# Patient Record
Sex: Female | Born: 1960 | Race: White | Hispanic: No | Marital: Single | Smoking: Never smoker
Health system: Southern US, Community
[De-identification: ages and names within clinical notes are randomized; demographics above are authoritative.]

## PROBLEM LIST (undated history)

## (undated) DIAGNOSIS — R112 Nausea with vomiting, unspecified: Secondary | ICD-10-CM

## (undated) DIAGNOSIS — Z87442 Personal history of urinary calculi: Secondary | ICD-10-CM

## (undated) DIAGNOSIS — E559 Vitamin D deficiency, unspecified: Secondary | ICD-10-CM

## (undated) DIAGNOSIS — Z9889 Other specified postprocedural states: Secondary | ICD-10-CM

## (undated) DIAGNOSIS — M810 Age-related osteoporosis without current pathological fracture: Secondary | ICD-10-CM

## (undated) DIAGNOSIS — K219 Gastro-esophageal reflux disease without esophagitis: Secondary | ICD-10-CM

## (undated) DIAGNOSIS — C801 Malignant (primary) neoplasm, unspecified: Secondary | ICD-10-CM

## (undated) HISTORY — PX: KNEE ARTHROSCOPY: SHX127

## (undated) HISTORY — PX: FRACTURE SURGERY: SHX138

## (undated) HISTORY — PX: APPENDECTOMY: SHX54

## (undated) SURGERY — Surgical Case
Anesthesia: *Unknown

---
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
## 2018-03-08 ENCOUNTER — Other Ambulatory Visit: Payer: Self-pay | Admitting: Physician Assistant

## 2018-03-08 DIAGNOSIS — M2392 Unspecified internal derangement of left knee: Secondary | ICD-10-CM

## 2018-03-23 ENCOUNTER — Ambulatory Visit
Admission: RE | Admit: 2018-03-23 | Discharge: 2018-03-23 | Disposition: A | Discharge status: Home / Self Care | Payer: PRIVATE HEALTH INSURANCE | Source: Ambulatory Visit | Attending: Physician Assistant | Admitting: Physician Assistant

## 2018-03-23 DIAGNOSIS — S83242A Other tear of medial meniscus, current injury, left knee, initial encounter: Secondary | ICD-10-CM | POA: Diagnosis not present

## 2018-03-23 DIAGNOSIS — X58XXXA Exposure to other specified factors, initial encounter: Secondary | ICD-10-CM | POA: Insufficient documentation

## 2018-03-23 DIAGNOSIS — M1712 Unilateral primary osteoarthritis, left knee: Secondary | ICD-10-CM | POA: Insufficient documentation

## 2018-03-23 DIAGNOSIS — M25462 Effusion, left knee: Secondary | ICD-10-CM | POA: Diagnosis not present

## 2018-03-23 DIAGNOSIS — M2392 Unspecified internal derangement of left knee: Secondary | ICD-10-CM

## 2018-04-24 ENCOUNTER — Ambulatory Visit: Payer: PRIVATE HEALTH INSURANCE | Admitting: Certified Registered"

## 2018-04-24 ENCOUNTER — Other Ambulatory Visit: Payer: Self-pay

## 2018-04-24 ENCOUNTER — Encounter: Payer: Self-pay | Admitting: *Deleted

## 2018-04-24 ENCOUNTER — Encounter
Admission: RE | Disposition: A | Discharge status: Home / Self Care | Payer: Self-pay | Source: Ambulatory Visit | Attending: Orthopedic Surgery

## 2018-04-24 ENCOUNTER — Ambulatory Visit
Admission: RE | Admit: 2018-04-24 | Discharge: 2018-04-24 | Disposition: A | Discharge status: Home / Self Care | Payer: PRIVATE HEALTH INSURANCE | Source: Ambulatory Visit | Attending: Orthopedic Surgery | Admitting: Orthopedic Surgery

## 2018-04-24 DIAGNOSIS — M238X2 Other internal derangements of left knee: Secondary | ICD-10-CM | POA: Diagnosis not present

## 2018-04-24 DIAGNOSIS — Z881 Allergy status to other antibiotic agents status: Secondary | ICD-10-CM | POA: Diagnosis not present

## 2018-04-24 DIAGNOSIS — Z8249 Family history of ischemic heart disease and other diseases of the circulatory system: Secondary | ICD-10-CM | POA: Diagnosis not present

## 2018-04-24 DIAGNOSIS — Z91040 Latex allergy status: Secondary | ICD-10-CM | POA: Insufficient documentation

## 2018-04-24 DIAGNOSIS — Z885 Allergy status to narcotic agent status: Secondary | ICD-10-CM | POA: Insufficient documentation

## 2018-04-24 DIAGNOSIS — Z9889 Other specified postprocedural states: Secondary | ICD-10-CM

## 2018-04-24 HISTORY — DX: Other specified postprocedural states: R11.2

## 2018-04-24 HISTORY — DX: Other specified postprocedural states: Z98.890

## 2018-04-24 HISTORY — PX: KNEE ARTHROSCOPY: SHX127

## 2018-04-24 SURGERY — ARTHROSCOPY, KNEE
Anesthesia: General | Site: Knee | Laterality: Left | Wound class: "Clean "

## 2018-04-24 MED ORDER — ACETAMINOPHEN 10 MG/ML IV SOLN
INTRAVENOUS | Status: DC | PRN
  Administered 2018-04-24: 1000 mg via INTRAVENOUS

## 2018-04-24 MED ORDER — MIDAZOLAM HCL 2 MG/2ML IJ SOLN
INTRAMUSCULAR | Status: DC | PRN
  Administered 2018-04-24: 4 mg via INTRAVENOUS

## 2018-04-24 MED ORDER — HYDROMORPHONE HCL 1 MG/ML IJ SOLN
INTRAMUSCULAR | Status: AC
  Filled 2018-04-24: qty 1

## 2018-04-24 MED ORDER — FENTANYL CITRATE (PF) 100 MCG/2ML IJ SOLN
INTRAMUSCULAR | Status: AC
  Filled 2018-04-24: qty 2

## 2018-04-24 MED ORDER — BUPIVACAINE-EPINEPHRINE (PF) 0.25% -1:200000 IJ SOLN
INTRAMUSCULAR | Status: AC
Start: 2018-04-24 — End: ?
  Filled 2018-04-24: qty 30

## 2018-04-24 MED ORDER — FAMOTIDINE 20 MG PO TABS
20.0000 mg | ORAL_TABLET | Freq: Once | ORAL | Status: AC
  Administered 2018-04-24: 20 mg via ORAL

## 2018-04-24 MED ORDER — PROPOFOL 10 MG/ML IV BOLUS
INTRAVENOUS | Status: AC
  Filled 2018-04-24: qty 20

## 2018-04-24 MED ORDER — GLYCOPYRROLATE 0.2 MG/ML IJ SOLN
INTRAMUSCULAR | Status: DC | PRN
  Administered 2018-04-24: 0.1 mg via INTRAVENOUS

## 2018-04-24 MED ORDER — ONDANSETRON HCL 4 MG/2ML IJ SOLN
INTRAMUSCULAR | Status: AC
  Filled 2018-04-24: qty 2

## 2018-04-24 MED ORDER — MORPHINE SULFATE (PF) 4 MG/ML IV SOLN
INTRAVENOUS | Status: AC
  Filled 2018-04-24: qty 1

## 2018-04-24 MED ORDER — DEXAMETHASONE SODIUM PHOSPHATE 10 MG/ML IJ SOLN
INTRAMUSCULAR | Status: AC
  Filled 2018-04-24: qty 1

## 2018-04-24 MED ORDER — LIDOCAINE HCL (PF) 2 % IJ SOLN
INTRAMUSCULAR | Status: AC
  Filled 2018-04-24: qty 10

## 2018-04-24 MED ORDER — FAMOTIDINE 20 MG PO TABS
ORAL_TABLET | ORAL | Status: AC
  Administered 2018-04-24: 20 mg via ORAL
  Filled 2018-04-24: qty 1

## 2018-04-24 MED ORDER — ONDANSETRON HCL 4 MG/2ML IJ SOLN: 4.0000 mg | Freq: Four times a day (QID) | INTRAMUSCULAR | Status: DC | PRN

## 2018-04-24 MED ORDER — HYDROCODONE-ACETAMINOPHEN 5-325 MG PO TABS: 1.0000 | ORAL_TABLET | ORAL | 0 refills | Status: DC | PRN

## 2018-04-24 MED ORDER — BUPIVACAINE-EPINEPHRINE (PF) 0.25% -1:200000 IJ SOLN
INTRAMUSCULAR | Status: DC | PRN
  Administered 2018-04-24: 5 mL
  Administered 2018-04-24: 25 mL

## 2018-04-24 MED ORDER — DEXAMETHASONE SODIUM PHOSPHATE 10 MG/ML IJ SOLN
INTRAMUSCULAR | Status: DC | PRN
  Administered 2018-04-24: 10 mg via INTRAVENOUS

## 2018-04-24 MED ORDER — CHLORHEXIDINE GLUCONATE 4 % EX LIQD: 60.0000 mL | Freq: Once | CUTANEOUS | Status: DC

## 2018-04-24 MED ORDER — ONDANSETRON HCL 4 MG PO TABS: 4.0000 mg | ORAL_TABLET | Freq: Four times a day (QID) | ORAL | Status: DC | PRN

## 2018-04-24 MED ORDER — SODIUM CHLORIDE 0.9 % IV SOLN: INTRAVENOUS | Status: DC

## 2018-04-24 MED ORDER — HYDROCODONE-ACETAMINOPHEN 5-325 MG PO TABS: 1.0000 | ORAL_TABLET | ORAL | Status: DC | PRN

## 2018-04-24 MED ORDER — HYDROMORPHONE HCL 1 MG/ML IJ SOLN
INTRAMUSCULAR | Status: DC | PRN
  Administered 2018-04-24 (×2): 1 mg via INTRAVENOUS

## 2018-04-24 MED ORDER — METOCLOPRAMIDE HCL 10 MG PO TABS: 5.0000 mg | ORAL_TABLET | Freq: Three times a day (TID) | ORAL | Status: DC | PRN

## 2018-04-24 MED ORDER — KETAMINE HCL 10 MG/ML IJ SOLN
INTRAMUSCULAR | Status: DC | PRN
  Administered 2018-04-24: 30 mg via INTRAVENOUS
  Administered 2018-04-24: 20 mg via INTRAVENOUS

## 2018-04-24 MED ORDER — FENTANYL CITRATE (PF) 100 MCG/2ML IJ SOLN
25.0000 ug | INTRAMUSCULAR | Status: DC | PRN
  Administered 2018-04-24 (×3): 25 ug via INTRAVENOUS

## 2018-04-24 MED ORDER — LIDOCAINE HCL (CARDIAC) PF 100 MG/5ML IV SOSY
PREFILLED_SYRINGE | INTRAVENOUS | Status: DC | PRN
  Administered 2018-04-24: 50 mg via INTRAVENOUS

## 2018-04-24 MED ORDER — MORPHINE SULFATE 4 MG/ML IJ SOLN
INTRAMUSCULAR | Status: DC | PRN
  Administered 2018-04-24: 4 mg

## 2018-04-24 MED ORDER — KETAMINE HCL 50 MG/ML IJ SOLN
INTRAMUSCULAR | Status: AC
  Filled 2018-04-24: qty 10

## 2018-04-24 MED ORDER — METOCLOPRAMIDE HCL 5 MG/ML IJ SOLN: 5.0000 mg | Freq: Three times a day (TID) | INTRAMUSCULAR | Status: DC | PRN

## 2018-04-24 MED ORDER — PROPOFOL 10 MG/ML IV BOLUS
INTRAVENOUS | Status: DC | PRN
  Administered 2018-04-24: 150 mg via INTRAVENOUS

## 2018-04-24 MED ORDER — ACETAMINOPHEN 10 MG/ML IV SOLN
INTRAVENOUS | Status: AC
  Filled 2018-04-24: qty 100

## 2018-04-24 MED ORDER — LACTATED RINGERS IV SOLN
INTRAVENOUS | Status: DC
  Administered 2018-04-24: 15:00:00 via INTRAVENOUS

## 2018-04-24 MED ORDER — ONDANSETRON HCL 4 MG/2ML IJ SOLN: 4.0000 mg | Freq: Once | INTRAMUSCULAR | Status: DC | PRN

## 2018-04-24 MED ORDER — GLYCOPYRROLATE 0.2 MG/ML IJ SOLN
INTRAMUSCULAR | Status: AC
  Filled 2018-04-24: qty 1

## 2018-04-24 MED ORDER — MIDAZOLAM HCL 2 MG/2ML IJ SOLN
INTRAMUSCULAR | Status: AC
  Filled 2018-04-24: qty 4

## 2018-04-24 MED ORDER — ONDANSETRON HCL 4 MG/2ML IJ SOLN
INTRAMUSCULAR | Status: DC | PRN
  Administered 2018-04-24: 4 mg via INTRAVENOUS

## 2018-04-24 SURGICAL SUPPLY — 28 items
ABLATOR ASPIRATE 50D MULTI-PRT (SURGICAL WAND) ×2 IMPLANT
BLADE SHAVER 4.5 DBL SERAT CV (CUTTER) IMPLANT
Bone Cutter Curved 4.0mm, 13cm ×2 IMPLANT
CUFF TOURN 24 STER (MISCELLANEOUS) ×2 IMPLANT
CUFF TOURN 30 STER DUAL PORT (MISCELLANEOUS) IMPLANT
CUTTER BONE CURVED 4.0X13 (MISCELLANEOUS) ×2 IMPLANT
DRSG DERMACEA 8X12 NADH (GAUZE/BANDAGES/DRESSINGS) ×3 IMPLANT
DURAPREP 26ML APPLICATOR (WOUND CARE) ×4 IMPLANT
DW OUTFLOW CASSETTE/TUBE SET (MISCELLANEOUS) ×2 IMPLANT
GAUZE SPONGE 4X4 12PLY STRL (GAUZE/BANDAGES/DRESSINGS) ×3 IMPLANT
GLOVE BIOGEL M STRL SZ7.5 (GLOVE) ×3 IMPLANT
GLOVE INDICATOR 8.0 STRL GRN (GLOVE) ×3 IMPLANT
GOWN STRL REUS W/ TWL LRG LVL3 (GOWN DISPOSABLE) ×2 IMPLANT
GOWN STRL REUS W/TWL LRG LVL3 (GOWN DISPOSABLE) ×4
IV LACTATED RINGER IRRG 3000ML (IV SOLUTION) ×10
IV LR IRRIG 3000ML ARTHROMATIC (IV SOLUTION) ×6 IMPLANT
KIT TURNOVER KIT A (KITS) ×3 IMPLANT
MANIFOLD NEPTUNE II (INSTRUMENTS) ×3 IMPLANT
PACK ARTHROSCOPY KNEE (MISCELLANEOUS) ×3 IMPLANT
SET TUBE SUCT SHAVER OUTFL 24K (TUBING) ×1 IMPLANT
SET TUBE TIP INTRA-ARTICULAR (MISCELLANEOUS) ×1 IMPLANT
SUT ETHILON 3-0 FS-10 30 BLK (SUTURE) ×3
SUTURE EHLN 3-0 FS-10 30 BLK (SUTURE) ×1 IMPLANT
TUBING ARTHRO INFLOW-ONLY STRL (TUBING) ×1 IMPLANT
TUBING REDEUCE PAT W/CON 8IN (MISCELLANEOUS) ×2 IMPLANT
TUBING REDEUCE PUMP W/CON 8IN (MISCELLANEOUS) ×2 IMPLANT
WAND HAND CNTRL MULTIVAC 50 (MISCELLANEOUS) ×3 IMPLANT
WRAP KNEE W/COLD PACKS 25.5X14 (SOFTGOODS) ×3 IMPLANT

## 2018-04-24 NOTE — H&P (Signed)
The patient has been re-examined, and the chart reviewed, and there have been no interval changes to the documented history and physical.    The risks, benefits, and alternatives have been discussed at length. The patient expressed understanding of the risks benefits and agreed with plans for surgical intervention.  James P. Hooten, Jr. M.D.    

## 2018-04-24 NOTE — Anesthesia Postprocedure Evaluation (Signed)
Anesthesia Post Note  Patient: Alice Taylor  Procedure(s) Performed: ARTHROSCOPY KNEE, PARTIAL MEDIAL MENISCECTOMY (Left Knee)  Patient location during evaluation: PACU Anesthesia Type: General Level of consciousness: awake and alert Pain management: pain level controlled Vital Signs Assessment: post-procedure vital signs reviewed and stable Respiratory status: spontaneous breathing, nonlabored ventilation, respiratory function stable and patient connected to nasal cannula oxygen Cardiovascular status: blood pressure returned to baseline and stable Postop Assessment: no apparent nausea or vomiting Anesthetic complications: no     Last Vitals:  Vitals:   04/24/18 1833 04/24/18 1912  BP:  104/70  Pulse: 88 93  Resp: 16 18  Temp: (!) 36.1 C 36.6 C  SpO2: 95% 95%    Last Pain:  Vitals:   04/24/18 1912  TempSrc: Temporal  PainSc: 2                  Martha Clan

## 2018-04-24 NOTE — Anesthesia Post-op Follow-up Note (Signed)
Anesthesia QCDR form completed.        

## 2018-04-24 NOTE — Anesthesia Preprocedure Evaluation (Signed)
Anesthesia Evaluation  Patient identified by MRN, date of birth, ID band Patient awake    Reviewed: Allergy & Precautions, NPO status , Patient's Chart, lab work & pertinent test results  History of Anesthesia Complications (+) PONV and history of anesthetic complications  Airway Mallampati: II  TM Distance: >3 FB     Dental  (+) Teeth Intact   Pulmonary neg pulmonary ROS,    Pulmonary exam normal        Cardiovascular negative cardio ROS Normal cardiovascular exam     Neuro/Psych negative neurological ROS  negative psych ROS   GI/Hepatic negative GI ROS, Neg liver ROS,   Endo/Other  negative endocrine ROS  Renal/GU negative Renal ROS  negative genitourinary   Musculoskeletal   Abdominal Normal abdominal exam  (+)   Peds negative pediatric ROS (+)  Hematology negative hematology ROS (+)   Anesthesia Other Findings   Reproductive/Obstetrics                             Anesthesia Physical Anesthesia Plan  ASA: I  Anesthesia Plan: General   Post-op Pain Management:    Induction: Intravenous  PONV Risk Score and Plan:   Airway Management Planned: LMA  Additional Equipment:   Intra-op Plan:   Post-operative Plan: Extubation in OR  Informed Consent: I have reviewed the patients History and Physical, chart, labs and discussed the procedure including the risks, benefits and alternatives for the proposed anesthesia with the patient or authorized representative who has indicated his/her understanding and acceptance.   Dental advisory given  Plan Discussed with:   Anesthesia Plan Comments:         Anesthesia Quick Evaluation

## 2018-04-24 NOTE — Discharge Instructions (Signed)
AMBULATORY SURGERY  DISCHARGE INSTRUCTIONS   1) The drugs that you were given will stay in your system until tomorrow so for the next 24 hours you should not:  A) Drive an automobile B) Make any legal decisions C) Drink any alcoholic beverage   2) You may resume regular meals tomorrow.  Today it is better to start with liquids and gradually work up to solid foods.  You may eat anything you prefer, but it is better to start with liquids, then soup and crackers, and gradually work up to solid foods.   3) Please notify your doctor immediately if you have any unusual bleeding, trouble breathing, redness and pain at the surgery site, drainage, fever, or pain not relieved by medication.    4) Additional Instructions:        Please contact your physician with any problems or Same Day Surgery at [phone], Monday through Friday 6 am to 4 pm, or [place] at [facility] number at [phone].AMBULATORY SURGERY          Please contact your physician with any problems or Same Day Surgery at [phone], Monday through Friday 6 am to 4 pm, or [place] at [facility] number at [phone].AMBULATORY SURGERY              Please contact your physician with any problems or Same Day Surgery at [phone], Monday through Friday 6 am to 4 pm, or [place] at [facility] number at [phone]. Instructions after Knee Arthroscopy    James P. Holley Bouche., M.D.     Dept. of [facility]  [address], [place]  [address]   Phone: [phone]   Fax: [phone]   DIET:  Drink plenty of non-alcoholic fluids & begin a light diet.  Resume your normal diet the day after surgery.  ACTIVITY:   You may use crutches or a walker with weight-bearing as tolerated, unless instructed otherwise.  You may wean yourself off of the walker or crutches as tolerated.   Begin doing gentle exercises. Exercising will  reduce the pain and swelling, increase motion, and prevent muscle weakness.    Avoid strenuous activities or athletics for a minimum of 4-6 weeks after arthroscopic surgery.  Do not drive or operate any equipment until instructed.  WOUND CARE:   Place one to two pillows under the knee the first day or two when sitting or lying.   Continue to use the ice packs periodically to reduce pain and swelling.  The small incisions in your knee are closed with nylon stitches. The stitches will be removed in the office.  The bulky dressing may be removed on the second day after surgery. DO NOT TOUCH THE STITCHES. Put a Band-Aid over each stitch. Do NOT use any ointments or creams on the incisions.   You may bathe or shower after the stitches are removed at the first office visit following surgery.  MEDICATIONS:  You may resume your regular medications.  Please take the pain medication as prescribed.  Do not take pain medication on an empty stomach.  Do not drive or drink alcoholic beverages when taking pain medications.  CALL THE OFFICE FOR:  Temperature above 101 degrees  Excessive bleeding or drainage on the dressing.  Excessive swelling, coldness, or paleness of the toes.  Persistent nausea and vomiting.  FOLLOW-UP:   You should have an appointment to return to the office in 7-10 days after surgery.

## 2018-04-24 NOTE — Anesthesia Procedure Notes (Signed)
Procedure Name: LMA Insertion Date/Time: 04/24/2018 3:14 PM Performed by: Nile Riggs, CRNA Pre-anesthesia Checklist: Patient identified, Emergency Drugs available, Suction available, Patient being monitored and Timeout performed Patient Re-evaluated:Patient Re-evaluated prior to induction Oxygen Delivery Method: Circle system utilized Preoxygenation: Pre-oxygenation with 100% oxygen Induction Type: IV induction Ventilation: Mask ventilation without difficulty LMA: LMA inserted LMA Size: 4.0 Tube type: Oral Number of attempts: 1 Placement Confirmation: positive ETCO2,  CO2 detector and breath sounds checked- equal and bilateral Tube secured with: Tape Dental Injury: Teeth and Oropharynx as per pre-operative assessment  Comments: Atraumatic seating of AuraStraight LMA

## 2018-04-24 NOTE — Transfer of Care (Signed)
Immediate Anesthesia Transfer of Care Note  Patient: Alice Taylor  Procedure(s) Performed: ARTHROSCOPY KNEE, PARTIAL MEDIAL MENISCECTOMY (Left Knee)  Patient Location: PACU  Anesthesia Type:General  Level of Consciousness: sedated and responds to stimulation  Airway & Oxygen Therapy: Patient Spontanous Breathing and Patient connected to face mask oxygen  Post-op Assessment: Report given to RN and Post -op Vital signs reviewed and stable  Post vital signs: Reviewed and stable  Last Vitals:  Vitals Value Taken Time  BP 104/60 04/24/2018  5:16 PM  Temp    Pulse 84 04/24/2018  5:16 PM  Resp 10 04/24/2018  5:16 PM  SpO2 98 % 04/24/2018  5:16 PM    Last Pain:  Vitals:   04/24/18 1424  TempSrc: Oral  PainSc: 5          Complications: No apparent anesthesia complications

## 2018-04-24 NOTE — Op Note (Signed)
OPERATIVE NOTE  DATE OF SURGERY:  04/24/2018  PATIENT NAME:  Alice Taylor   DOB: February 18, 1961  MRN: [id number]   PRE-OPERATIVE DIAGNOSIS:  Internal derangement of the left knee   POST-OPERATIVE DIAGNOSIS:   Complex tear of the posterior horn of the medial meniscus, left knee  PROCEDURE:  Left knee arthroscopy, partial medial meniscectomy  SURGEON:  Marciano Sequin., M.D.   ASSISTANT: none  ANESTHESIA: general  ESTIMATED BLOOD LOSS: Minimal  FLUIDS REPLACED: 600 mL of crystalloid  TOURNIQUET TIME: not used   INDICATIONS FOR SURGERY: Shaquayla Klimas is a 57 y.o. year old female who has been seen for complaints of left knee pain. MRI demonstrated findings consistent with meniscal pathology. After discussion of the risks and benefits of surgical intervention, the patient expressed understanding of the risks benefits and agree with plans for left knee arthroscopy.   PROCEDURE IN DETAIL: The patient was brought into the operating room and, after adequate general anesthesia was achieved, a tourniquet was applied to the left thigh and the leg was placed in the leg holder. All bony prominences were well padded. The patient's left knee was cleaned and prepped with alcohol and Duraprep and draped in the usual sterile fashion. A "timeout" was performed as per usual protocol. The anticipated portal sites were injected with 0.25% Marcaine with epinephrine. An anterolateral incision was made and a cannula was inserted. A moderate effusion was evacuated and the knee was distended with fluid using the pump. The scope was advanced down the medial gutter into the medial compartment. Under visualization with the scope, an anteromedial portal was created and a hooked probe was inserted. The medial meniscus was visualized and probed. There was a complex tear of the posterior horn of the medial meniscus. The tear was debrided using meniscal punches and a 4.5 mm incisor shaver. Final contouring was performed using a  50 degree wand, The remaining rim of meniscus was visualized and probed and noted to be stable. The articular cartilage was visualized and noted to be in good condition.  The scope was then advanced into the intercondylar notch. The anterior cruciate ligament was visualized and probed and felt to be intact. The scope was removed from the lateral portal and reinserted via the anteromedial portal to better visualize the lateral compartment. The lateral meniscus was visualized and probed. The lateral meniscus was intact without evidence of tear or instability. The articular cartilage of the lateral compartment was visualized and noted to be in good condition Finally, the scope was advanced so as to visualize the patellofemoral articulation. Good patellar tracking was appreciated.   The knee was irrigated with copius amounts of fluid and suctioned dry. The anterolateral portal was re-approximated with #3-0 nylon. A combination of 0.25% Marcaine with epinephrine and 4 mg of Morphine were injected via the scope. The scope was removed and the anteromedial portal was re-approximated with #3-0 nylon. A sterile dressing was applied followed by application of an ice wrap.  The patient tolerated the procedure well and was transported to the PACU in stable condition.  James P. Holley Bouche., M.D.

## 2018-04-25 ENCOUNTER — Encounter: Payer: Self-pay | Admitting: Orthopedic Surgery

## 2019-01-16 ENCOUNTER — Encounter: Payer: Self-pay | Admitting: *Deleted

## 2019-01-16 ENCOUNTER — Emergency Department: Payer: PRIVATE HEALTH INSURANCE

## 2019-01-16 ENCOUNTER — Other Ambulatory Visit: Payer: Self-pay

## 2019-01-16 ENCOUNTER — Inpatient Hospital Stay
Admission: EM | Admit: 2019-01-16 | Discharge: 2019-01-19 | DRG: 494 | Disposition: A | Discharge status: Home Health | Payer: PRIVATE HEALTH INSURANCE | Attending: Orthopedic Surgery | Admitting: Orthopedic Surgery

## 2019-01-16 DIAGNOSIS — S82851A Displaced trimalleolar fracture of right lower leg, initial encounter for closed fracture: Principal | ICD-10-CM

## 2019-01-16 DIAGNOSIS — Z8781 Personal history of (healed) traumatic fracture: Secondary | ICD-10-CM

## 2019-01-16 DIAGNOSIS — Z9889 Other specified postprocedural states: Secondary | ICD-10-CM

## 2019-01-16 DIAGNOSIS — W541XXA Struck by dog, initial encounter: Secondary | ICD-10-CM

## 2019-01-16 DIAGNOSIS — Z91040 Latex allergy status: Secondary | ICD-10-CM

## 2019-01-16 DIAGNOSIS — Z1159 Encounter for screening for other viral diseases: Secondary | ICD-10-CM

## 2019-01-16 DIAGNOSIS — Z885 Allergy status to narcotic agent status: Secondary | ICD-10-CM

## 2019-01-16 DIAGNOSIS — Z888 Allergy status to other drugs, medicaments and biological substances status: Secondary | ICD-10-CM

## 2019-01-16 DIAGNOSIS — Z79899 Other long term (current) drug therapy: Secondary | ICD-10-CM

## 2019-01-16 DIAGNOSIS — T402X5A Adverse effect of other opioids, initial encounter: Secondary | ICD-10-CM | POA: Diagnosis not present

## 2019-01-16 DIAGNOSIS — R110 Nausea: Secondary | ICD-10-CM | POA: Diagnosis not present

## 2019-01-16 DIAGNOSIS — Z85828 Personal history of other malignant neoplasm of skin: Secondary | ICD-10-CM

## 2019-01-16 HISTORY — DX: Malignant (primary) neoplasm, unspecified: C80.1

## 2019-01-16 MED ORDER — ONDANSETRON 4 MG PO TBDP
4.0000 mg | ORAL_TABLET | Freq: Once | ORAL | Status: AC
  Administered 2019-01-16: 4 mg via ORAL
  Filled 2019-01-16: qty 1

## 2019-01-16 MED ORDER — HYDROCODONE-ACETAMINOPHEN 5-325 MG PO TABS
2.0000 | ORAL_TABLET | Freq: Once | ORAL | Status: AC
  Administered 2019-01-16: 2 via ORAL
  Filled 2019-01-16: qty 2

## 2019-01-16 NOTE — ED Provider Notes (Addendum)
[facility] Emergency Department Provider Note       Time seen: ----------------------------------------- 11:36 PM on 01/16/2019 -----------------------------------------   I have reviewed the triage vital signs and the nursing notes.  HISTORY   Chief Complaint Ankle Injury    HPI Alice Taylor is a 58 y.o. female with no significant past medical history who presents to the ED for right ankle injury.  Patient states she was knocked down by her dog which is a [place], subsequently injuring her right ankle.  She cannot bear any weight on the right leg.  She complains of 9 out of 10 pain is worse with any manipulation of the right ankle.  She denies any fevers, chills or other complaints.  Past Medical History:  Diagnosis Date  . PONV (postoperative nausea and vomiting)     There are no active problems to display for this patient.   Past Surgical History:  Procedure Laterality Date  . APPENDECTOMY    . KNEE ARTHROSCOPY Right   . KNEE ARTHROSCOPY Left 04/24/2018   Procedure: ARTHROSCOPY KNEE, PARTIAL MEDIAL MENISCECTOMY;  Surgeon: Dereck Leep, MD;  Location: [facility] ORS;  Service: Orthopedics;  Laterality: Left;    Allergies Latex; Neosporin [neomycin-bacitracin zn-polymyx]; and Codeine  Social History Social History   Tobacco Use  . Smoking status: Never Smoker  . Smokeless tobacco: Never Used  Substance Use Topics  . Alcohol use: Never    Frequency: Never  . Drug use: Not Currently   Review of Systems Constitutional: Negative for fever. Cardiovascular: Negative for chest pain. Respiratory: Negative for shortness of breath. Gastrointestinal: Negative for abdominal pain, vomiting and diarrhea. Musculoskeletal: Positive for right ankle pain and swelling Skin: Negative for rash. Neurological: Negative for headaches, focal weakness or numbness.  All systems negative/normal/unremarkable except as stated in the  HPI  ____________________________________________   PHYSICAL EXAM:  VITAL SIGNS: ED Triage Vitals  Enc Vitals Group     BP 01/16/19 2314 (!) 142/114     Pulse Rate 01/16/19 2314 (!) 106     Resp 01/16/19 2314 18     Temp 01/16/19 2314 98.1 F (36.7 C)     Temp Source 01/16/19 2314 Oral     SpO2 01/16/19 2314 100 %     Weight 01/16/19 2315 160 lb (72.6 kg)     Height 01/16/19 2315 5\' 9"  (1.753 m)     Head Circumference --      Peak Flow --      Pain Score 01/16/19 2323 10     Pain Loc --      Pain Edu? --      Excl. in [place]? --    Constitutional: Alert and oriented. Well appearing and in no distress. Eyes: Conjunctivae are normal. Normal extraocular movements. Cardiovascular: Normal rate, regular rhythm. No murmurs, rubs, or gallops. Respiratory: Normal respiratory effort without tachypnea nor retractions. Breath sounds are clear and equal bilaterally. No wheezes/rales/rhonchi. Musculoskeletal: Obvious deformity in and around the right ankle, pain with range of motion of the right ankle, no tenderness over the proximal fibula Neurologic:  Normal speech and language. No gross focal neurologic deficits are appreciated.  Skin: Right ankle swelling and mild ecchymosis is noted Psychiatric: Mood and affect are normal. Speech and behavior are normal.  ____________________________________________  ED COURSE:  As part of my medical decision making, I reviewed the following data within the [place] History obtained from family if available, nursing notes, old chart and ekg, as well  as notes from prior ED visits. Patient presented for right ankle injury, we will assess with labs and imaging as indicated at this time.   Reduction of dislocation Date/Time: 01/17/2019 1:04 AM Performed by: Earleen Newport, MD Authorized by: Earleen Newport, MD  Consent: Verbal consent obtained. Consent given by: patient Patient identity confirmed: verbally with patient Local  anesthesia used: no  Anesthesia: Local anesthesia used: no  Sedation: Patient sedated: no  Patient tolerance: Patient tolerated the procedure well with no immediate complications Comments: Right ankle fracture dislocation was reduced with downward traction, splints were applied with success.     Alice Taylor was evaluated in Emergency Department on 01/16/2019 for the symptoms described in the history of present illness. She was evaluated in the context of the global COVID-19 pandemic, which necessitated consideration that the patient might be at risk for infection with the SARS-CoV-2 virus that causes COVID-19. Institutional protocols and algorithms that pertain to the evaluation of patients at risk for COVID-19 are in a state of rapid change based on information released by regulatory bodies including the CDC and federal and state organizations. These policies and algorithms were followed during the patient's care in the ED.  ____________________________________________   LABS (pertinent positives/negatives)  Labs Reviewed  SARS CORONAVIRUS 2 (HOSPITAL ORDER, [place] [facility])  CBC WITH DIFFERENTIAL/PLATELET  COMPREHENSIVE METABOLIC PANEL  HIV ANTIBODY (ROUTINE TESTING W REFLEX)    RADIOLOGY Images were viewed by me Right ankle x-ray IMPRESSION: Trimalleolar right ankle fracture with severe subluxation or dislocation.   ____________________________________________   DIFFERENTIAL DIAGNOSIS   Fracture, contusion, dislocation  FINAL ASSESSMENT AND PLAN  Trimalleolar fracture   Plan: The patient had presented for right ankle injury. Patient's labs are as dictated above. Patient's imaging did reveal a trimalleolar right ankle fracture.  She was given Dilaudid for pain here and we discussed with orthopedic surgery for admission.  She will need operative fixation of her right ankle fracture.  We did place her in a posterior and sugar tong  splint.   Laurence Aly, MD    Note: This note was generated in part or whole with voice recognition software. Voice recognition is usually quite accurate but there are transcription errors that can and very often do occur. I apologize for any typographical errors that were not detected and corrected.     Earleen Newport, MD 01/17/19 5621    Earleen Newport, MD 01/17/19 [phone]

## 2019-01-16 NOTE — ED Triage Notes (Signed)
Pt knocked down by her dog, injured R ankle, obviously displaced.

## 2019-01-17 ENCOUNTER — Encounter: Payer: Self-pay | Admitting: Anesthesiology

## 2019-01-17 ENCOUNTER — Encounter
Admission: EM | Disposition: A | Discharge status: Home Health | Payer: Self-pay | Source: Home / Self Care | Attending: Orthopedic Surgery

## 2019-01-17 ENCOUNTER — Other Ambulatory Visit: Payer: Self-pay

## 2019-01-17 ENCOUNTER — Inpatient Hospital Stay: Payer: PRIVATE HEALTH INSURANCE | Admitting: Anesthesiology

## 2019-01-17 ENCOUNTER — Inpatient Hospital Stay: Payer: PRIVATE HEALTH INSURANCE

## 2019-01-17 DIAGNOSIS — S82851A Displaced trimalleolar fracture of right lower leg, initial encounter for closed fracture: Secondary | ICD-10-CM | POA: Diagnosis present

## 2019-01-17 DIAGNOSIS — Z85828 Personal history of other malignant neoplasm of skin: Secondary | ICD-10-CM | POA: Diagnosis not present

## 2019-01-17 DIAGNOSIS — T402X5A Adverse effect of other opioids, initial encounter: Secondary | ICD-10-CM | POA: Diagnosis not present

## 2019-01-17 DIAGNOSIS — Z1159 Encounter for screening for other viral diseases: Secondary | ICD-10-CM | POA: Diagnosis not present

## 2019-01-17 DIAGNOSIS — Z79899 Other long term (current) drug therapy: Secondary | ICD-10-CM | POA: Diagnosis not present

## 2019-01-17 DIAGNOSIS — W541XXA Struck by dog, initial encounter: Secondary | ICD-10-CM | POA: Diagnosis not present

## 2019-01-17 DIAGNOSIS — R110 Nausea: Secondary | ICD-10-CM | POA: Diagnosis not present

## 2019-01-17 DIAGNOSIS — Z888 Allergy status to other drugs, medicaments and biological substances status: Secondary | ICD-10-CM | POA: Diagnosis not present

## 2019-01-17 DIAGNOSIS — Z91040 Latex allergy status: Secondary | ICD-10-CM | POA: Diagnosis not present

## 2019-01-17 DIAGNOSIS — Z885 Allergy status to narcotic agent status: Secondary | ICD-10-CM | POA: Diagnosis not present

## 2019-01-17 HISTORY — PX: ORIF ANKLE FRACTURE: SHX5408

## 2019-01-17 LAB — CBC WITH DIFFERENTIAL/PLATELET
Abs Immature Granulocytes: 0.03 10*3/uL (ref 0.00–0.07)
Basophils Absolute: 0 10*3/uL (ref 0.0–0.1)
Basophils Relative: 0 %
Eosinophils Absolute: 0.1 10*3/uL (ref 0.0–0.5)
Eosinophils Relative: 1 %
HCT: 39.8 % (ref 36.0–46.0)
Hemoglobin: 13 g/dL (ref 12.0–15.0)
Immature Granulocytes: 0 %
Lymphocytes Relative: 11 %
Lymphs Abs: 1.1 10*3/uL (ref 0.7–4.0)
MCH: 29.3 pg (ref 26.0–34.0)
MCHC: 32.7 g/dL (ref 30.0–36.0)
MCV: 89.8 fL (ref 80.0–100.0)
Monocytes Absolute: 0.9 10*3/uL (ref 0.1–1.0)
Monocytes Relative: 8 %
Neutro Abs: 8.1 10*3/uL — ABNORMAL HIGH (ref 1.7–7.7)
Neutrophils Relative %: 80 %
Platelets: 322 10*3/uL (ref 150–400)
RBC: 4.43 MIL/uL (ref 3.87–5.11)
RDW: 12.4 % (ref 11.5–15.5)
WBC: 10.2 10*3/uL (ref 4.0–10.5)
nRBC: 0 % (ref 0.0–0.2)

## 2019-01-17 LAB — COMPREHENSIVE METABOLIC PANEL
ALT: 22 U/L (ref 0–44)
AST: 27 U/L (ref 15–41)
Albumin: 4.2 g/dL (ref 3.5–5.0)
Alkaline Phosphatase: 88 U/L (ref 38–126)
Anion gap: 9 (ref 5–15)
BUN: 19 mg/dL (ref 6–20)
CO2: 24 mmol/L (ref 22–32)
Calcium: 9 mg/dL (ref 8.9–10.3)
Chloride: 106 mmol/L (ref 98–111)
Creatinine, Ser: 0.73 mg/dL (ref 0.44–1.00)
GFR calc Af Amer: >60 mL/min (ref >60)
GFR calc non Af Amer: >60 mL/min (ref >60)
Glucose, Bld: 109 mg/dL — ABNORMAL HIGH (ref 70–99)
Potassium: 3.4 mmol/L — ABNORMAL LOW (ref 3.5–5.1)
Sodium: 139 mmol/L (ref 135–145)
Total Bilirubin: 0.5 mg/dL (ref 0.3–1.2)
Total Protein: 7.7 g/dL (ref 6.5–8.1)

## 2019-01-17 LAB — SARS CORONAVIRUS 2 BY RT PCR (HOSPITAL ORDER, PERFORMED IN ~~LOC~~ HOSPITAL LAB): SARS Coronavirus 2: NEGATIVE

## 2019-01-17 SURGERY — OPEN REDUCTION INTERNAL FIXATION (ORIF) ANKLE FRACTURE
Anesthesia: Spinal | Site: Ankle | Laterality: Right

## 2019-01-17 MED ORDER — MAGNESIUM HYDROXIDE 400 MG/5ML PO SUSP
30.0000 mL | Freq: Every day | ORAL | Status: DC | PRN
  Filled 2019-01-17: qty 30

## 2019-01-17 MED ORDER — METOCLOPRAMIDE HCL 5 MG/ML IJ SOLN
5.0000 mg | Freq: Three times a day (TID) | INTRAMUSCULAR | Status: DC | PRN
  Administered 2019-01-18: 10 mg via INTRAVENOUS
  Filled 2019-01-17: qty 2

## 2019-01-17 MED ORDER — BUPIVACAINE IN DEXTROSE 0.75-8.25 % IT SOLN
INTRATHECAL | Status: DC | PRN
  Administered 2019-01-17: 1.3 mL via INTRATHECAL

## 2019-01-17 MED ORDER — LACTATED RINGERS IV SOLN
INTRAVENOUS | Status: DC
  Administered 2019-01-17: 10:00:00 via INTRAVENOUS

## 2019-01-17 MED ORDER — FENTANYL CITRATE (PF) 100 MCG/2ML IJ SOLN: 25.0000 ug | INTRAMUSCULAR | Status: DC | PRN

## 2019-01-17 MED ORDER — CEFAZOLIN SODIUM 1 G IJ SOLR
INTRAMUSCULAR | Status: AC
  Filled 2019-01-17: qty 10

## 2019-01-17 MED ORDER — OXYCODONE HCL 5 MG PO TABS
10.0000 mg | ORAL_TABLET | ORAL | Status: DC | PRN
  Administered 2019-01-17 – 2019-01-19 (×4): 10 mg via ORAL
  Filled 2019-01-17 (×2): qty 2

## 2019-01-17 MED ORDER — MAGNESIUM CITRATE PO SOLN
1.0000 | Freq: Once | ORAL | Status: DC | PRN
  Filled 2019-01-17: qty 296

## 2019-01-17 MED ORDER — DOCUSATE SODIUM 100 MG PO CAPS
100.0000 mg | ORAL_CAPSULE | Freq: Two times a day (BID) | ORAL | Status: DC
  Administered 2019-01-17 – 2019-01-19 (×3): 100 mg via ORAL
  Filled 2019-01-17 (×4): qty 1

## 2019-01-17 MED ORDER — ACETAMINOPHEN 325 MG PO TABS: 325.0000 mg | ORAL_TABLET | Freq: Four times a day (QID) | ORAL | Status: DC | PRN

## 2019-01-17 MED ORDER — SODIUM CHLORIDE 0.9 % IV SOLN
INTRAVENOUS | Status: DC
  Administered 2019-01-17 (×2): via INTRAVENOUS

## 2019-01-17 MED ORDER — HYDROMORPHONE HCL 1 MG/ML IJ SOLN
0.5000 mg | INTRAMUSCULAR | Status: DC | PRN
  Administered 2019-01-17: 1 mg via INTRAVENOUS
  Filled 2019-01-17: qty 1

## 2019-01-17 MED ORDER — ONDANSETRON HCL 4 MG PO TABS
4.0000 mg | ORAL_TABLET | Freq: Four times a day (QID) | ORAL | Status: DC | PRN
  Administered 2019-01-17: 20:00:00 4 mg via ORAL
  Filled 2019-01-17: qty 1

## 2019-01-17 MED ORDER — METOCLOPRAMIDE HCL 5 MG PO TABS: 5.0000 mg | ORAL_TABLET | Freq: Three times a day (TID) | ORAL | Status: DC | PRN

## 2019-01-17 MED ORDER — ENOXAPARIN SODIUM 40 MG/0.4ML ~~LOC~~ SOLN
40.0000 mg | SUBCUTANEOUS | Status: DC
  Administered 2019-01-18 – 2019-01-19 (×2): 40 mg via SUBCUTANEOUS
  Filled 2019-01-17 (×2): qty 0.4

## 2019-01-17 MED ORDER — ZOLPIDEM TARTRATE 5 MG PO TABS: 5.0000 mg | ORAL_TABLET | Freq: Every evening | ORAL | Status: DC | PRN

## 2019-01-17 MED ORDER — OXYCODONE HCL 5 MG PO TABS
5.0000 mg | ORAL_TABLET | ORAL | Status: DC | PRN
  Administered 2019-01-17 (×2): 10 mg via ORAL
  Administered 2019-01-18 (×4): 5 mg via ORAL
  Administered 2019-01-19 (×2): 10 mg via ORAL
  Filled 2019-01-17: qty 1
  Filled 2019-01-17: qty 2
  Filled 2019-01-17 (×2): qty 1
  Filled 2019-01-17: qty 2
  Filled 2019-01-17: qty 1
  Filled 2019-01-17: qty 2

## 2019-01-17 MED ORDER — BISACODYL 5 MG PO TBEC
5.0000 mg | DELAYED_RELEASE_TABLET | Freq: Every day | ORAL | Status: DC | PRN
  Administered 2019-01-17: 15:00:00 5 mg via ORAL
  Filled 2019-01-17: qty 1

## 2019-01-17 MED ORDER — ONDANSETRON HCL 4 MG/2ML IJ SOLN: 4.0000 mg | Freq: Four times a day (QID) | INTRAMUSCULAR | Status: DC | PRN

## 2019-01-17 MED ORDER — CEFAZOLIN SODIUM-DEXTROSE 2-4 GM/100ML-% IV SOLN
2.0000 g | Freq: Four times a day (QID) | INTRAVENOUS | Status: AC
  Administered 2019-01-17 – 2019-01-18 (×3): 2 g via INTRAVENOUS
  Filled 2019-01-17 (×3): qty 100

## 2019-01-17 MED ORDER — VITAMIN D 25 MCG (1000 UNIT) PO TABS
1000.0000 [IU] | ORAL_TABLET | Freq: Every day | ORAL | Status: DC
  Administered 2019-01-18 – 2019-01-19 (×2): 1000 [IU] via ORAL
  Filled 2019-01-17 (×2): qty 1

## 2019-01-17 MED ORDER — FENTANYL CITRATE (PF) 100 MCG/2ML IJ SOLN
INTRAMUSCULAR | Status: AC
  Filled 2019-01-17: qty 2

## 2019-01-17 MED ORDER — OXYCODONE HCL 5 MG PO TABS
5.0000 mg | ORAL_TABLET | ORAL | Status: DC | PRN
  Filled 2019-01-17 (×3): qty 2

## 2019-01-17 MED ORDER — CEFAZOLIN SODIUM-DEXTROSE 2-4 GM/100ML-% IV SOLN
2.0000 g | Freq: Once | INTRAVENOUS | Status: AC
  Administered 2019-01-17: 2 g via INTRAVENOUS
  Filled 2019-01-17: qty 100

## 2019-01-17 MED ORDER — PHENYLEPHRINE HCL (PRESSORS) 10 MG/ML IV SOLN
INTRAVENOUS | Status: DC | PRN
  Administered 2019-01-17 (×3): 100 ug via INTRAVENOUS

## 2019-01-17 MED ORDER — MIDAZOLAM HCL 5 MG/5ML IJ SOLN
INTRAMUSCULAR | Status: DC | PRN
  Administered 2019-01-17: 2 mg via INTRAVENOUS

## 2019-01-17 MED ORDER — PROPOFOL 500 MG/50ML IV EMUL
INTRAVENOUS | Status: AC
  Filled 2019-01-17: qty 50

## 2019-01-17 MED ORDER — ONDANSETRON HCL 4 MG/2ML IJ SOLN: 4.0000 mg | Freq: Once | INTRAMUSCULAR | Status: DC | PRN

## 2019-01-17 MED ORDER — TRAMADOL HCL 50 MG PO TABS
50.0000 mg | ORAL_TABLET | Freq: Four times a day (QID) | ORAL | Status: DC
  Administered 2019-01-17 – 2019-01-19 (×7): 50 mg via ORAL
  Filled 2019-01-17 (×7): qty 1

## 2019-01-17 MED ORDER — PROPOFOL 500 MG/50ML IV EMUL
INTRAVENOUS | Status: DC | PRN
  Administered 2019-01-17: 50 ug/kg/min via INTRAVENOUS

## 2019-01-17 MED ORDER — HYDROMORPHONE HCL 1 MG/ML IJ SOLN
0.5000 mg | Freq: Once | INTRAMUSCULAR | Status: AC
  Administered 2019-01-17: 0.5 mg via INTRAVENOUS
  Filled 2019-01-17: qty 1

## 2019-01-17 MED ORDER — MIDAZOLAM HCL 2 MG/2ML IJ SOLN
INTRAMUSCULAR | Status: AC
  Filled 2019-01-17: qty 2

## 2019-01-17 MED ORDER — HYDROMORPHONE HCL 1 MG/ML IJ SOLN: 1.0000 mg | Freq: Once | INTRAMUSCULAR | Status: DC

## 2019-01-17 MED ORDER — FENTANYL CITRATE (PF) 100 MCG/2ML IJ SOLN
INTRAMUSCULAR | Status: DC | PRN
  Administered 2019-01-17: 50 ug via INTRAVENOUS

## 2019-01-17 SURGICAL SUPPLY — 62 items
BANDAGE ACE 4X5 VEL STRL LF (GAUZE/BANDAGES/DRESSINGS) ×6 IMPLANT
BIT DRILL 2.5X2.75 QC CALB (BIT) ×3 IMPLANT
BIT DRILL 2.9 CANN QC NONSTRL (BIT) ×3 IMPLANT
BIT DRILL 3.5X5.5 QC CALB (BIT) ×3 IMPLANT
BIT DRILL CALIBRATED 2.7 (BIT) ×2 IMPLANT
BIT DRILL CALIBRATED 2.7MM (BIT) ×1
CANISTER SUCT 1200ML W/VALVE (MISCELLANEOUS) ×3 IMPLANT
CHLORAPREP W/TINT 26 (MISCELLANEOUS) ×3 IMPLANT
COVER WAND RF STERILE (DRAPES) ×3 IMPLANT
CUFF TOURN SGL QUICK 24 (TOURNIQUET CUFF)
CUFF TOURN SGL QUICK 30 (TOURNIQUET CUFF)
CUFF TRNQT CYL 24X4X16.5-23 (TOURNIQUET CUFF) IMPLANT
CUFF TRNQT CYL 30X4X21-28X (TOURNIQUET CUFF) IMPLANT
DRAPE FLUOR MINI C-ARM 54X84 (DRAPES) ×3 IMPLANT
DRAPE INCISE IOBAN 66X45 STRL (DRAPES) ×3 IMPLANT
DRAPE U-SHAPE 47X51 STRL (DRAPES) ×3 IMPLANT
DRSG EMULSION OIL 3X8 NADH (GAUZE/BANDAGES/DRESSINGS) ×3 IMPLANT
ELECT CAUTERY BLADE 6.4 (BLADE) ×3 IMPLANT
ELECT REM PT RETURN 9FT ADLT (ELECTROSURGICAL) ×3
ELECTRODE REM PT RTRN 9FT ADLT (ELECTROSURGICAL) ×1 IMPLANT
GAUZE SPONGE 4X4 12PLY STRL (GAUZE/BANDAGES/DRESSINGS) ×3 IMPLANT
GAUZE XEROFORM 1X8 LF (GAUZE/BANDAGES/DRESSINGS) ×3 IMPLANT
GLOVE SURG SYN 9.0  PF PI (GLOVE) ×2
GLOVE SURG SYN 9.0 PF PI (GLOVE) ×1 IMPLANT
GOWN SRG 2XL LVL 4 RGLN SLV (GOWNS) ×1 IMPLANT
GOWN STRL NON-REIN 2XL LVL4 (GOWNS) ×2
GOWN STRL REUS W/ TWL LRG LVL3 (GOWN DISPOSABLE) ×1 IMPLANT
GOWN STRL REUS W/TWL LRG LVL3 (GOWN DISPOSABLE) ×2
HEMOVAC 400ML (MISCELLANEOUS)
K-WIRE ACE 1.6X6 (WIRE) ×6
KIT DRAIN HEMOVAC JP 7FR 400ML (MISCELLANEOUS) IMPLANT
KIT TURNOVER KIT A (KITS) ×3 IMPLANT
KWIRE ACE 1.6X6 (WIRE) ×2 IMPLANT
LABEL OR SOLS (LABEL) ×3 IMPLANT
NS IRRIG 1000ML POUR BTL (IV SOLUTION) ×3 IMPLANT
PACK EXTREMITY ARMC (MISCELLANEOUS) ×3 IMPLANT
PAD ABD DERMACEA PRESS 5X9 (GAUZE/BANDAGES/DRESSINGS) ×6 IMPLANT
PAD CAST CTTN 4X4 STRL (SOFTGOODS) ×2 IMPLANT
PAD PREP 24X41 OB/GYN DISP (PERSONAL CARE ITEMS) ×3 IMPLANT
PADDING CAST COTTON 4X4 STRL (SOFTGOODS) ×4
PLATE LOCK 7H 92 BILAT FIB (Plate) ×3 IMPLANT
SCALPEL PROTECTED #15 DISP (BLADE) ×6 IMPLANT
SCREW ACE CAN 4.0 40M (Screw) ×3 IMPLANT
SCREW CORTICAL 3.5MM  16MM (Screw) ×2 IMPLANT
SCREW CORTICAL 3.5MM 16MM (Screw) ×1 IMPLANT
SCREW LOCK CORT STAR 3.5X12 (Screw) ×3 IMPLANT
SCREW LOCK CORT STAR 3.5X16 (Screw) ×3 IMPLANT
SCREW LOCK CORT STAR 3.5X18 (Screw) ×3 IMPLANT
SCREW NON LOCKING LP 3.5 16MM (Screw) ×9 IMPLANT
SPLINT CAST 1 STEP 3X12 (MISCELLANEOUS) ×6 IMPLANT
SPLINT CAST 1 STEP 5X30 WHT (MISCELLANEOUS) ×3 IMPLANT
SPONGE LAP 18X18 RF (DISPOSABLE) ×3 IMPLANT
STAPLER SKIN PROX 35W (STAPLE) ×3 IMPLANT
SUT ETHILON 3-0 FS-10 30 BLK (SUTURE) ×3
SUT MNCRL AB 4-0 PS2 18 (SUTURE) ×6 IMPLANT
SUT VIC AB 0 CT1 36 (SUTURE) ×3 IMPLANT
SUT VIC AB 2-0 SH 27 (SUTURE) ×4
SUT VIC AB 2-0 SH 27XBRD (SUTURE) ×2 IMPLANT
SUT VIC AB 3-0 SH 27 (SUTURE) ×2
SUT VIC AB 3-0 SH 27X BRD (SUTURE) ×1 IMPLANT
SUTURE EHLN 3-0 FS-10 30 BLK (SUTURE) ×1 IMPLANT
SYR 10ML LL (SYRINGE) ×3 IMPLANT

## 2019-01-17 NOTE — Anesthesia Post-op Follow-up Note (Signed)
Anesthesia QCDR form completed.        

## 2019-01-17 NOTE — Transfer of Care (Signed)
Immediate Anesthesia Transfer of Care Note  Patient: Alice Taylor  Procedure(s) Performed: OPEN REDUCTION INTERNAL FIXATION (ORIF) ANKLE FRACTURE (Right Ankle)  Patient Location: PACU  Anesthesia Type:Spinal  Level of Consciousness: drowsy and patient cooperative  Airway & Oxygen Therapy: Patient Spontanous Breathing and Patient connected to nasal cannula oxygen  Post-op Assessment: Report given to RN and Post -op Vital signs reviewed and stable  Post vital signs: Reviewed and stable  Last Vitals:  Vitals Value Taken Time  BP 98/56 01/17/2019 10:50 AM  Temp    Pulse 70 01/17/2019 10:50 AM  Resp 20 01/17/2019 10:50 AM  SpO2 100 % 01/17/2019 10:50 AM  Vitals shown include unvalidated device data.  Last Pain:  Vitals:   01/17/19 1045  TempSrc:   PainSc: (P) 0-No pain         Complications: No apparent anesthesia complications

## 2019-01-17 NOTE — Plan of Care (Signed)
Pt alert and oriented. Pain controlled with PO medications, elevation, and ice pak.

## 2019-01-17 NOTE — H&P (Signed)
Subjective:   Patient is a 58 y.o. female presents with right ankle pain and deformity. Onset of symptoms was abrupt starting 10 hours ago with unchanged course since that time. The pain is located around the right ankle. Patient describes the pain as sharp at times continuous and rated as moderate. Pain has been associated with a fall suffered when her dog tripped her at home. Patient denies loss of consciousness. Symptoms are aggravated by any movement. Symptoms improve with elevation. Past history includes prior left knee arthroscopy.  Previous studies include radiographs and ER showing a displaced trimalleolar ankle fracture right ankle.  Patient Active Problem List   Diagnosis Date Noted  . Closed right trimalleolar fracture, initial encounter 01/17/2019   Past Medical History:  Diagnosis Date  . PONV (postoperative nausea and vomiting)     Past Surgical History:  Procedure Laterality Date  . APPENDECTOMY    . KNEE ARTHROSCOPY Right   . KNEE ARTHROSCOPY Left 04/24/2018   Procedure: ARTHROSCOPY KNEE, PARTIAL MEDIAL MENISCECTOMY;  Surgeon: Dereck Leep, MD;  Location: [facility] ORS;  Service: Orthopedics;  Laterality: Left;    Medications Prior to Admission  Medication Sig Dispense Refill Last Dose  . cholecalciferol (VITAMIN D) 1000 units tablet Take 1,000 Units by mouth daily.   04/23/2018 at Unknown time  . HYDROcodone-acetaminophen (NORCO) 5-325 MG tablet Take 1-2 tablets by mouth every 4 (four) hours as needed for moderate pain. 20 tablet 0    Allergies  Allergen Reactions  . Latex Hives  . Neosporin [Neomycin-Bacitracin Zn-Polymyx] Other (See Comments)    Blisters  . Codeine Rash    Social History   Tobacco Use  . Smoking status: Never Smoker  . Smokeless tobacco: Never Used  Substance Use Topics  . Alcohol use: Never    Frequency: Never    History reviewed. No pertinent family history.  Review of Systems Pertinent items are noted in HPI.  Objective:   Patient  Vitals for the past 8 hrs:  BP Temp Temp src Pulse Resp SpO2  01/17/19 0434 113/69 97.8 F (36.6 C) Oral 93 18 94 %  01/17/19 0146 (!) 142/82 97.9 F (36.6 C) - 96 18 97 %   I/O last 3 completed shifts: In: 52.1 [I.V.:52.1] Out: -  No intake/output data recorded.    BP 113/69 (BP Location: Left Arm)   Pulse 93   Temp 97.8 F (36.6 C) (Oral)   Resp 18   Ht 5\' 9"  (1.753 m)   Wt 72.6 kg   SpO2 94%   BMI 23.63 kg/m  General appearance: alert, cooperative, appears stated age and mild distress Lungs: clear to auscultation bilaterally Heart: regular rate and rhythm, S1, S2 normal, no murmur, click, rub or gallop Extremities: Right leg is in splint with brisk capillary refill and intact sensation   Data ReviewRadiology review: Displaced trimalleolar ankle fracture with small posterior mall fragment that should not require fixation.  Assessment:   Active Problems:   Closed right trimalleolar fracture, initial encounter   Plan:   Open reduction internal fixation medial and lateral malleolus I, discussed with her that she will need to be essentially nonweightbearing toe-touch weightbearing for 6 weeks discussed risks of procedure in particular wound healing and infection, potential for future hardware removal.

## 2019-01-17 NOTE — ED Notes (Addendum)
Posterior/Sugar tong splint applied by this tech per MD order. Assisted by Dr. Jimmye Norman.

## 2019-01-17 NOTE — Anesthesia Procedure Notes (Signed)
Spinal  Patient location during procedure: OR Staffing Anesthesiologist: Hillery Zachman, MD Performed: anesthesiologist  Preanesthetic Checklist Completed: patient identified, site marked, surgical consent, pre-op evaluation, timeout performed, IV checked and risks and benefits discussed Spinal Block Patient position: sitting Prep: ChloraPrep Patient monitoring: heart rate, cardiac monitor, continuous pulse ox and blood pressure Approach: midline Location: L3-4 Injection technique: single-shot Needle Needle type: Pencil-Tip  Needle gauge: 25 G Needle length: 9 cm Assessment Sensory level: T10     

## 2019-01-17 NOTE — Op Note (Signed)
01/17/2019  10:52 AM  PATIENT:  Alice Taylor  58 y.o. female  PRE-OPERATIVE DIAGNOSIS:  Right Ankle Fracture, trimalleolar  POST-OPERATIVE DIAGNOSIS:  Right Ankle Fracture, trimalleolar  PROCEDURE:  Procedure(s): OPEN REDUCTION INTERNAL FIXATION (ORIF) ANKLE FRACTURE (Right) ORIF of medial and lateral malleolar line  SURGEON: Laurene Footman, MD  ASSISTANTS: None  ANESTHESIA:   spinal  EBL:  Total I/O In: 400 [I.V.:400] Out: -   BLOOD ADMINISTERED:none  DRAINS: none   LOCAL MEDICATIONS USED:  NONE  SPECIMEN:  No Specimen  DISPOSITION OF SPECIMEN:  N/A  COUNTS:  YES  TOURNIQUET:   Total Tourniquet Time Documented: Thigh (Right) - 42 minutes Total: Thigh (Right) - 42 minutes   IMPLANTS: Biomet composite fibular plate with 2 cannulated screws medially multiple screws lateral  DICTATION: .Dragon Dictation patient was brought to the operating room and after adequate anesthesia was obtained the right leg was prepped and draped in the usual sterile fashion with a tourniquet applied to the upper thigh and a bump under the right buttock to internally rotate the leg.  The leg was prepped and draped without allowing for the ankle to sublux or dislocate.  After patient identification and timeout procedures were completed, tourniquet was raised.  A lateral approach was made to the fibula with small cutaneous nerves preserved as much as possible.  With internal rotation of the foot near anatomic reduction was obtained after stripping the periosteum at the fracture site and a reduction clamp was used me attain anatomic alignment of the fibula a posterior to anterior interfragmentary screw was placed over drilling the proximal cortex and placing the cortical screw removing the clamp the fracture stayed in anatomic position a composite plate was then contoured to fit the distal fibula 7 hole plate being utilized with 3 cortical screws placed proximally and 4 locking screws distally as a  neutralization plate under fluoroscopic views the ankle appeared anatomically reduced.  Medially and anterior medial approach was made to the medial malleolar fragment and it was held in position with 2 guidewires inserted from the tip of the medial malleolus up into the metaphysis these were drilled and 40 mm leg screw was inserted giving good compression at the fracture site the guidewires were removed and the ankle appeared stable under stress views.  The wounds were irrigated and closed with 3-0 Vicryl subcutaneously and skin staples with Xeroform 4 x 4's web roll and a stirrup splint applied followed by an Ace wrap tourniquet let down at the close of the case  PLAN OF CARE: Continue as outpatient  PATIENT DISPOSITION:  PACU - hemodynamically stable.

## 2019-01-17 NOTE — Progress Notes (Signed)
Pt nausea/vomiting post surgical. Pt states she typically does this after having surgery. Zofran PO given at her request. Not effective. Notified [facility] NP [place]. Will continue to monitor. Pdowless, rn

## 2019-01-17 NOTE — TOC Initial Note (Signed)
Transition of Care [facility]) - Initial/Assessment Note    Patient Details  Name: Alice Taylor MRN: [id number] Date of Birth: Apr 07, 1961  Transition of Care [facility]) CM/SW Contact:    Su Hilt, RN Phone Number: 01/17/2019, 12:20 PM  Clinical Narrative:                 Met with the patient to discuss Needs and DC plan She has a Walker at home Lives with her husband and he will help care for her if needed and provide transportation She would like to use [facility] for [facility] PT, notified Ruthann Cancer a PA at [facility] named [place] Can afford medications CM to continue to monitor for needs  Expected Discharge Plan: [place] Barriers to Discharge: Continued Medical Work up   Patient Goals and CMS Choice Patient states their goals for this hospitalization and ongoing recovery are:: to go home CMS Medicare.gov Compare Post Acute Care list provided to:: Patient Choice offered to / list presented to : Patient  Expected Discharge Plan and Services Expected Discharge Plan: [place]   Discharge Planning Services: CM Consult Post Acute Care Choice: [place] arrangements for the past 2 months: [place]: PT [place]: [place] ([place]) Date [place]: 01/17/19 Time [place]: 1218 Representative spoke with at [place]: Corene Cornea  Prior Living Arrangements/Services Living arrangements for the past 2 months: [place] with:: Spouse Patient language and need for interpreter reviewed:: No Do you feel safe going back to the place where you live?: Yes      Need for Family Participation in Patient Care: No (Comment) Care giver support system in place?: Yes (comment) Current home services: (cane, walker) Criminal Activity/Legal Involvement Pertinent to Current Situation/Hospitalization: No - Comment as needed  Activities of Daily  Living Home Assistive Devices/Equipment: Eyeglasses ADL Screening (condition at time of admission) Patient's cognitive ability adequate to safely complete daily activities?: Yes Is the patient deaf or have difficulty hearing?: No Does the patient have difficulty seeing, even when wearing glasses/contacts?: No Does the patient have difficulty concentrating, remembering, or making decisions?: No Patient able to express need for assistance with ADLs?: No Does the patient have difficulty dressing or bathing?: No Independently performs ADLs?: Yes (appropriate for developmental age) Does the patient have difficulty walking or climbing stairs?: No Weakness of Legs: None Weakness of Arms/Hands: None  Permission Sought/Granted Permission sought to share information with : Case Manager Permission granted to share information with : Yes, Verbal Permission Granted     Permission granted to share info w AGENCY: [facility]        Emotional Assessment Appearance:: Appears stated age Attitude/Demeanor/Rapport: Engaged Affect (typically observed): Accepting Orientation: : Oriented to Self, Oriented to Place, Oriented to Situation, Oriented to  Time Alcohol / Substance Use: Never Used Psych Involvement: No (comment)  Admission diagnosis:  Trimalleolar fracture of ankle, closed, right, initial encounter [F09.323F] Patient Active Problem List   Diagnosis Date Noted  . Closed right trimalleolar fracture, initial encounter 01/17/2019   PCP:  Patient, No Pcp Per Pharmacy:   [id number] [address] [id number]: [phone]: [phone]    Social Determinants of Health ([place]  Interventions    Readmission Risk Interventions No flowsheet data found.

## 2019-01-17 NOTE — Anesthesia Postprocedure Evaluation (Signed)
Anesthesia Post Note  Patient: Alice Taylor  Procedure(s) Performed: OPEN REDUCTION INTERNAL FIXATION (ORIF) ANKLE FRACTURE (Right Ankle)  Patient location during evaluation: PACU Anesthesia Type: Spinal Level of consciousness: oriented and awake and alert Pain management: pain level controlled Vital Signs Assessment: post-procedure vital signs reviewed and stable Respiratory status: spontaneous breathing, respiratory function stable and patient connected to nasal cannula oxygen Cardiovascular status: blood pressure returned to baseline and stable Postop Assessment: no headache, no backache and no apparent nausea or vomiting Anesthetic complications: no     Last Vitals:  Vitals:   01/17/19 1345 01/17/19 1439  BP: 114/83 128/73  Pulse: 71 79  Resp: 18 18  Temp: (!) 36.4 C 36.7 C  SpO2: 100% 96%    Last Pain:  Vitals:   01/17/19 1440  TempSrc:   PainSc: 10-Worst pain ever                 Margaretmary Prisk S

## 2019-01-17 NOTE — TOC Progression Note (Signed)
Transition of Care [facility]) - Progression Note    Patient Details  Name: Alice Taylor MRN: [id number] Date of Birth: [birth year]  [facility]) CM/SW Contact  Su Hilt, RN Phone Number: 01/17/2019, 2:03 PM  Clinical Narrative:     I reached out to eqach individual [facility] agency and none are accepting the patient's insurance, I notified the patient of the issue and asked if she wanted to do self pay and be reimbursed by her insurance company or what would she like to do, she stated that she has used [place] outpatient physical therapy in the past and she would like to use them instead of home health, she will set her appointment up  Expected Discharge Plan: [place] Barriers to Discharge: Continued Medical Work up  Expected Discharge Plan and Services Expected Discharge Plan: [place]   Discharge Planning Services: CM Consult Post Acute Care Choice: [place] arrangements for the past 2 months: [place]: PT [place]: [place] ([place]) Date HH Agency Contacted: 01/17/19 Time [place]: 1218 Representative spoke with at [place]: [place] (SDOH) Interventions    Readmission Risk Interventions No flowsheet data found.

## 2019-01-17 NOTE — ED Notes (Signed)
.. ED TO INPATIENT HANDOFF REPORT  ED Nurse Name and Phone #: Deneise Lever [phone]  S Name/Age/Gender Alice Taylor 58 y.o. female Room/Bed: ED08A/ED08A  Code Status   Code Status: Full Code  Home/SNF/Other Home Patient oriented to: self, place, time and situation Is this baseline? Yes   Triage Complete: Triage complete  Chief Complaint Fall, foot injury  Triage Note Pt knocked down by her dog, injured R ankle, obviously displaced.   Allergies Allergies  Allergen Reactions  . Latex Hives  . Neosporin [Neomycin-Bacitracin Zn-Polymyx] Other (See Comments)    Blisters  . Codeine Rash    Level of Care/Admitting Diagnosis ED Disposition    ED Disposition Condition [place] [facility] Area: [place] [[id number]]  Level of Care: Med-Surg [16]  Covid Evaluation: Screening Protocol (No Symptoms)  Diagnosis: Closed right trimalleolar fracture, initial encounter [361443]  Admitting Physician: Hessie Knows [[id number]]  Attending Physician: Hessie Knows [phone]  Estimated length of stay: past midnight tomorrow  Certification:: I certify this patient will need inpatient services for at least 2 midnights  PT Class (Do Not Modify): Inpatient [101]  PT Acc Code (Do Not Modify): Private [1]       B Medical/Surgery History Past Medical History:  Diagnosis Date  . PONV (postoperative nausea and vomiting)    Past Surgical History:  Procedure Laterality Date  . APPENDECTOMY    . KNEE ARTHROSCOPY Right   . KNEE ARTHROSCOPY Left 04/24/2018   Procedure: ARTHROSCOPY KNEE, PARTIAL MEDIAL MENISCECTOMY;  Surgeon: Dereck Leep, MD;  Location: [facility] ORS;  Service: Orthopedics;  Laterality: Left;     A IV Location/Drains/Wounds Patient Lines/Drains/Airways Status   Active Line/Drains/Airways    Name:   Placement date:   Placement time:   Site:   Days:   Incision (Closed) 04/24/18 Knee Left   04/24/18    1604     268   Incision - 2 Ports Other (Comment) Lateral  Lateral   04/24/18    -     268          Intake/Output Last 24 hours No intake or output data in the 24 hours ending 01/17/19 0046  Labs/Imaging No results found for this or any previous visit (from the past 48 hour(s)). Dg Ankle Complete Right  Result Date: 01/16/2019 CLINICAL DATA:  Fall EXAM: RIGHT ANKLE - COMPLETE 3+ VIEW COMPARISON:  None. FINDINGS: Displaced fractures noted at the base of the medial malleolus and through the distal fibular metaphysis. There is also fracture through the posterior malleolus of the distal tibia. Severe subluxation or dislocation at the tibiotalar joint. IMPRESSION: Trimalleolar right ankle fracture with severe subluxation or dislocation. Electronically Signed   By: Rolm Baptise M.D.   On: 01/16/2019 23:49    Pending Labs Unresulted Labs (From admission, onward)    Start     Ordered   01/17/19 0006  HIV antibody (Routine Testing)  Once,   STAT     01/17/19 0011   01/17/19 0001  CBC with Differential/Platelet  ONCE - STAT,   STAT     01/17/19 0000   01/17/19 0001  Comprehensive metabolic panel  ONCE - STAT,   STAT     01/17/19 0000   01/17/19 0001  SARS Coronavirus 2 (CEPHEID - Performed in [place] hospital lab), [place]  (Asymptomatic Patients Labs)  Once,   STAT    Question:  Rule Out  Answer:  Yes   01/17/19 0000  Vitals/Pain Today's Vitals   01/16/19 2315 01/16/19 2323 01/16/19 2355 01/17/19 0021  BP:      Pulse:      Resp:      Temp:      TempSrc:      SpO2:      Weight: 72.6 kg     Height: 5\' 9"  (1.753 m)     PainSc:  10-Worst pain ever 10-Worst pain ever 9     Isolation Precautions No active isolations  Medications Medications  0.9 %  sodium chloride infusion (has no administration in time range)  acetaminophen (TYLENOL) tablet 325-650 mg (has no administration in time range)  oxyCODONE (Oxy IR/ROXICODONE) immediate release tablet 5-10 mg (has no administration in time range)  oxyCODONE (Oxy IR/ROXICODONE)  immediate release tablet 10-15 mg (has no administration in time range)  HYDROmorphone (DILAUDID) injection 0.5-1 mg (has no administration in time range)  ceFAZolin (ANCEF) IVPB 2g/100 mL premix (has no administration in time range)  HYDROcodone-acetaminophen (NORCO/VICODIN) 5-325 MG per tablet 2 tablet (2 tablets Oral Given 01/16/19 2355)  ondansetron (ZOFRAN-ODT) disintegrating tablet 4 mg (4 mg Oral Given 01/16/19 2355)  HYDROmorphone (DILAUDID) injection 0.5 mg (0.5 mg Intravenous Given 01/17/19 0022)    Mobility non-ambulatory Low fall risk   Focused Assessments Musculoskeletal   R Recommendations: See Admitting Provider Note  Report given to:   Additional Notes:

## 2019-01-17 NOTE — Anesthesia Preprocedure Evaluation (Signed)
Anesthesia Evaluation  Patient identified by MRN, date of birth, ID band Patient awake    Reviewed: Allergy & Precautions, NPO status , Patient's Chart, lab work & pertinent test results, reviewed documented beta blocker date and time   History of Anesthesia Complications (+) PONV  Airway Mallampati: II  TM Distance: >3 FB     Dental  (+) Chipped   Pulmonary           Cardiovascular      Neuro/Psych    GI/Hepatic   Endo/Other    Renal/GU      Musculoskeletal   Abdominal   Peds  Hematology   Anesthesia Other Findings   Reproductive/Obstetrics                             Anesthesia Physical Anesthesia Plan  ASA: II  Anesthesia Plan: Spinal   Post-op Pain Management:    Induction:   PONV Risk Score and Plan:   Airway Management Planned:   Additional Equipment:   Intra-op Plan:   Post-operative Plan:   Informed Consent: I have reviewed the patients History and Physical, chart, labs and discussed the procedure including the risks, benefits and alternatives for the proposed anesthesia with the patient or authorized representative who has indicated his/her understanding and acceptance.       Plan Discussed with: CRNA  Anesthesia Plan Comments:         Anesthesia Quick Evaluation

## 2019-01-18 LAB — HIV ANTIBODY (ROUTINE TESTING W REFLEX): HIV Screen 4th Generation wRfx: NONREACTIVE

## 2019-01-18 MED ORDER — ONDANSETRON HCL 4 MG PO TABS: 4.0000 mg | ORAL_TABLET | Freq: Four times a day (QID) | ORAL | 0 refills | Status: DC | PRN

## 2019-01-18 MED ORDER — OXYCODONE HCL 5 MG PO TABS: 5.0000 mg | ORAL_TABLET | ORAL | 0 refills | Status: DC | PRN

## 2019-01-18 MED ORDER — ASPIRIN EC 325 MG PO TBEC: 325.0000 mg | DELAYED_RELEASE_TABLET | Freq: Every day | ORAL | 0 refills | Status: DC

## 2019-01-18 MED ORDER — SCOPOLAMINE 1 MG/3DAYS TD PT72
1.0000 | MEDICATED_PATCH | TRANSDERMAL | Status: DC
  Administered 2019-01-18: 14:00:00 1.5 mg via TRANSDERMAL
  Filled 2019-01-18: qty 1

## 2019-01-18 MED ORDER — SCOPOLAMINE 1 MG/3DAYS TD PT72: 1.0000 | MEDICATED_PATCH | TRANSDERMAL | 0 refills | Status: DC

## 2019-01-18 NOTE — Discharge Summary (Addendum)
Physician Discharge Summary  Patient ID: Alice Taylor MRN: [id number] DOB/AGE: 59-May-1962 59 y.o.  Admit date: 01/16/2019 Discharge date: 01/20/2019 Admission Diagnoses:  Trimalleolar fracture of ankle, closed, right, initial encounter [S82.851A]   Discharge Diagnoses: Patient Active Problem List   Diagnosis Date Noted  . Closed right trimalleolar fracture, initial encounter 01/17/2019    Past Medical History:  Diagnosis Date  . 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 PONV (postoperative nausea and vomiting)      Transfusion: none   Consultants (if any):   Discharged Condition: Improved  Hospital Course: Alice Taylor is an 58 y.o. female who was admitted 01/16/2019 with a diagnosis of right ankle trimalleolar fracture and went to the operating room on 01/17/2019 and underwent the above named procedures.    Surgeries: Procedure(s): OPEN REDUCTION INTERNAL FIXATION (ORIF) ANKLE FRACTURE on 01/17/2019 Patient tolerated the surgery well. Taken to PACU where she was stabilized and then transferred to the orthopedic floor.  Started on Lovenox 40 mg q 24 hrs. Foot pumps applied bilaterally at 80 mm. Heels elevated on bed with rolled towels. No evidence of DVT. Negative [place]. Physical therapy started on day #1 for gait training and transfer. OT started day #1 for ADL and assisted devices.  Patient's foley was d/c on day #1.  Patient made good progress of physical therapy on postop day 1.  On postop day 1 she was noted to have nausea with pain medication.  Scopolamine patch was started which improved nausea.  On postop day 3, patient stable and ready for discharge to home.  Implants: Biomet composite fibular plate with 2 cannulated screws medially multiple screws lateral  She was given perioperative antibiotics:  Anti-infectives (From admission, onward)   Start     Dose/Rate Route Frequency Ordered Stop   01/17/19 1200  ceFAZolin (ANCEF) IVPB 2g/100 mL premix     2 g 200 mL/hr over 30 Minutes  Intravenous Every 6 hours 01/17/19 1147 01/18/19 0511   01/17/19 1000  ceFAZolin (ANCEF) IVPB 2g/100 mL premix     2 g 200 mL/hr over 30 Minutes Intravenous  Once 01/17/19 0011 01/17/19 1012    .  She was given sequential compression devices, early ambulation, and aspirin for DVT prophylaxis.  She benefited maximally from the hospital stay and there were no complications.    Recent vital signs:  Vitals:   01/18/19 1924 01/19/19 0441  BP: 133/83 128/75  Pulse: 78 89  Resp: 18 18  Temp: 98.5 F (36.9 C) 99.2 F (37.3 C)  SpO2: 99% 93%    Recent laboratory studies:  Lab Results  Component Value Date   HGB 13.0 01/17/2019   Lab Results  Component Value Date   WBC 10.2 01/17/2019   PLT 322 01/17/2019   No results found for: INR Lab Results  Component Value Date   NA 139 01/17/2019   K 3.4 (L) 01/17/2019   CL 106 01/17/2019   CO2 24 01/17/2019   BUN 19 01/17/2019   CREATININE 0.73 01/17/2019   GLUCOSE 109 (H) 01/17/2019    Discharge Medications:   Allergies as of 01/19/2019      Reactions   Latex Hives   Codeine Rash   Neosporin [neomycin-bacitracin Zn-polymyx] Rash, Other (See Comments)   Blisters      Medication List    TAKE these medications   aspirin EC 325 MG tablet Take 1 tablet (325 mg total) by mouth daily.   cholecalciferol 1000 units tablet Commonly known as:  VITAMIN  D Take 1,000 Units by mouth daily.   COLLAGEN PO Take by mouth.   ondansetron 4 MG tablet Commonly known as:  ZOFRAN Take 1 tablet (4 mg total) by mouth every 6 (six) hours as needed for nausea.   oxyCODONE 5 MG immediate release tablet Commonly known as:  Oxy IR/ROXICODONE Take 1-2 tablets (5-10 mg total) by mouth every 4 (four) hours as needed for moderate pain (pain score 4-6).   scopolamine 1 MG/3DAYS Commonly known as:  TRANSDERM-SCOP Place 1 patch (1.5 mg total) onto the skin every 3 (three) days.   traMADol 50 MG tablet Commonly known as:  ULTRAM Take 1 tablet  (50 mg total) by mouth every 6 (six) hours.            Durable Medical Equipment  (From admission, onward)         Start     Ordered   01/17/19 1148  DME Walker rolling  Once    Question:  Patient needs a walker to treat with the following condition  Answer:  S/P ORIF (open reduction internal fixation) fracture   01/17/19 1147   01/17/19 1148  DME 3 n 1  Once     01/17/19 1147   01/17/19 1148  DME Bedside commode  Once    Question:  Patient needs a bedside commode to treat with the following condition  Answer:  S/P ORIF (open reduction internal fixation) fracture   01/17/19 1147          Diagnostic Studies: Dg Ankle Complete Right  Result Date: 01/16/2019 CLINICAL DATA:  Fall EXAM: RIGHT ANKLE - COMPLETE 3+ VIEW COMPARISON:  None. FINDINGS: Displaced fractures noted at the base of the medial malleolus and through the distal fibular metaphysis. There is also fracture through the posterior malleolus of the distal tibia. Severe subluxation or dislocation at the tibiotalar joint. IMPRESSION: Trimalleolar right ankle fracture with severe subluxation or dislocation. Electronically Signed   By: Rolm Baptise M.D.   On: 01/16/2019 23:49   Dg Ankle Right Port  Result Date: 01/17/2019 CLINICAL DATA:  Status post right ankle surgery EXAM: PORTABLE RIGHT ANKLE - 2 VIEW COMPARISON:  01/16/2011 FINDINGS: Medial malleolar fracture transfixed with 2 cannulated screws in near anatomic alignment. Distal fibular fracture transfixed with a lateral sideplate and multiple interlocking screws in anatomic alignment. No other fracture or dislocation. Intact ankle mortise. Soft tissue swelling around the ankle. Small plantar calcaneal spur. IMPRESSION: Interval ORIF of a bimalleolar fracture. Electronically Signed   By: Kathreen Devoid   On: 01/17/2019 11:30    Disposition: Discharge disposition: 01-Home or [place] outpatient physical therapy. Schedule an  appointment as soon as possible for a visit.   Contact information: call to set up appointment for physical therapy       Duanne Guess, PA-C Follow up.   Specialties:  Orthopedic Surgery, Emergency Medicine Why:  Follow-up next week for dressing change. Contact information: [place] [address] [phone]            Signed: Feliberto Gottron 01/19/2019, 7:54 AM

## 2019-01-18 NOTE — Discharge Instructions (Signed)
Diet: As you were doing prior to hospitalization   Dressing: Please keep splint on, clean and dry until you follow-up with orthopedics.  Activity:  Increase activity slowly as tolerated, but follow the weight bearing instructions below.  No lifting or driving for 6 weeks.  Weight Bearing: Nonweightbearing right lower extremity  To prevent constipation: you may use a stool softener such as -  Colace (over the counter) 100 mg by mouth twice a day  Drink plenty of fluids (prune juice may be helpful) and high fiber foods Miralax (over the counter) for constipation as needed.    Itching:  If you experience itching with your medications, try taking only a single pain pill, or even half a pain pill at a time.  You may take up to 10 pain pills per day, and you can also use benadryl over the counter for itching or also to help with sleep.   Precautions:  If you experience chest pain or shortness of breath - call 911 immediately for transfer to the hospital emergency department!!  If you develop a fever greater that 101 F, purulent drainage from wound, increased redness or drainage from wound, or calf pain-Call [place]                                               Follow- Up Appointment: Please follow-up with orthopedic office next week for dressing change and skin check
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
## 2019-01-18 NOTE — Progress Notes (Signed)
New order for additional dose of Zofran 4mg  IV for N/V. Pdowless, rn
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
## 2019-01-18 NOTE — Progress Notes (Signed)
   Subjective: 1 Day Post-Op Procedure(s) (LRB): OPEN REDUCTION INTERNAL FIXATION (ORIF) ANKLE FRACTURE (Right) Patient reports pain as 9 on 0-10 scale.   Patient is having nausea with oxycodone.  Zofran helping a little.  Patient having less nausea with lower doses of oxycodone Denies any CP, SOB, ABD pain. We will continue therapy today.  Plan is to go home today  Objective: Vital signs in last 24 hours: Temp:  [97.2 F (36.2 C)-98.8 F (37.1 C)] 97.8 F (36.6 C) (05/09 0825) Pulse Rate:  [63-112] 112 (05/09 0900) Resp:  [12-20] 19 (05/09 0433) BP: (98-147)/(56-87) 147/86 (05/09 0825) SpO2:  [96 %-100 %] 96 % (05/09 0900)  Intake/Output from previous day: 05/08 0701 - 05/09 0700 In: 2099.7 [P.O.:480; I.V.:1419.5; IV Piggyback:200.2] Out: 601 [Urine:601] Intake/Output this shift: No intake/output data recorded.  Recent Labs    01/17/19 0007  HGB 13.0   Recent Labs    01/17/19 0007  WBC 10.2  RBC 4.43  HCT 39.8  PLT 322   Recent Labs    01/17/19 0007  NA 139  K 3.4*  CL 106  CO2 24  BUN 19  CREATININE 0.73  GLUCOSE 109*  CALCIUM 9.0   No results for input(s): LABPT, INR in the last 72 hours.  EXAM General - Patient is Alert, Appropriate and Oriented Extremity - Neurovascular intact Sensation intact distally Intact pulses distally Dorsiflexion/Plantar flexion intact No cellulitis present Compartment soft Dressing - dressing C/D/I and no drainage splint intact. Motor Function - intact, moving toes well on exam  Past Medical History:  Diagnosis Date  . 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 PONV (postoperative nausea and vomiting)     Assessment/Plan:   1 Day Post-Op Procedure(s) (LRB): OPEN REDUCTION INTERNAL FIXATION (ORIF) ANKLE FRACTURE (Right) Active Problems:   Closed right trimalleolar fracture, initial encounter  Estimated body mass index is 24.37 kg/m as calculated from the following:   Height as of this encounter: 5\' 9"  (1.753 m).   Weight  as of this encounter: 74.8 kg. Advance diet Up with therapy, nonweightbearing right lower extremity.  Patient making good progress with PT today. Moderate to severe pain, only able to tolerate 5 mg of oxycodone due to nausea.  Will try scopolamine patch to see if she can increase oxycodone with less nausea. If pain and nausea improved later this afternoon patient can discharge home. Follow-up with [facility] orthopedics first of next week.   DVT Prophylaxis - Lovenox and SCDs Nonweightbearing right lower extremity   T. Rachelle Hora, PA-C [place] 01/18/2019, 9:33 AM

## 2019-01-18 NOTE — Progress Notes (Signed)
Patient is still having problems with pain.  Received scopolamine patch around 2pm from pharmacy and administered to patient. She is concerned about leaving while her pain is elevated and her feeling nauseous. Ok with the PA Rachelle Hora to keep patient another night. Discontinued the discharge order.

## 2019-01-18 NOTE — TOC Transition Note (Signed)
Transition of Care [facility]) - CM/SW Discharge Note   Patient Details  Name: Alice Taylor MRN: [id number] Date of Birth: 1961-02-16  Transition of Care [facility]) CM/SW Contact:  Latanya Maudlin, RN Phone Number: 01/18/2019, 10:28 AM   Clinical Narrative:  Patient was being worked up for home health but due to insurance issues no agency was able to accept. Patient lives with her spouse, and has no safety concerns. Patient was working to get set up with [facility] but has a family member at the [facility] outpatient PT and prefers to go there. I have instructed her to call Monday am and provided a list of all outpatient PT agencies in the area. Rolling walker ordered and brought to the bedside.      Final next level of care: Home/Self Care Barriers to Discharge: No Barriers Identified   Patient Goals and CMS Choice Patient states their goals for this hospitalization and ongoing recovery are:: to go home CMS Medicare.gov Compare Post Acute Care list provided to:: Patient Choice offered to / list presented to : Patient  Discharge Placement                       Discharge Plan and Services   Discharge Planning Services: CM Consult Post Acute Care Choice: Home Health          DME Arranged: [place]ford Rile DME Agency: [employer] Date DME Agency Contacted: 01/18/19 Time DME Agency Contacted: [phone] Representative spoke with at DME Agency: [place]: PT Walker: [place] ([place]) Date [place]: 01/17/19 Time [place]: 1218 Representative spoke with at [place]: [place] ([place]) Interventions     Readmission Risk Interventions No flowsheet data found.

## 2019-01-19 MED ORDER — TRAMADOL HCL 50 MG PO TABS: 50.0000 mg | ORAL_TABLET | Freq: Four times a day (QID) | ORAL | 0 refills | Status: DC

## 2019-01-19 NOTE — Progress Notes (Signed)
Patient is pleasant and cooperative with care. Patient reports her pain 6-9 out of 10 scale. Medication given, she states it is helpful and her pain is less. Reports shooting pain with movement.

## 2019-01-19 NOTE — Progress Notes (Signed)
   Subjective: 2 Days Post-Op Procedure(s) (LRB): OPEN REDUCTION INTERNAL FIXATION (ORIF) ANKLE FRACTURE (Right) Patient reports pain as mild.   Nausea resolved with scopolamine patch.  She is doing well pain much improved from yesterday. Denies any CP, SOB, ABD pain. We will continue therapy today.  Plan is to go home today  Objective: Vital signs in last 24 hours: Temp:  [97.8 F (36.6 C)-99.2 F (37.3 C)] 99.2 F (37.3 C) (05/10 0441) Pulse Rate:  [78-112] 89 (05/10 0441) Resp:  [14-18] 18 (05/10 0441) BP: (128-147)/(75-93) 128/75 (05/10 0441) SpO2:  [93 %-100 %] 93 % (05/10 0441)  Intake/Output from previous day: 05/09 0701 - 05/10 0700 In: 785 [P.O.:720; I.V.:65] Out: -  Intake/Output this shift: No intake/output data recorded.  Recent Labs    01/17/19 0007  HGB 13.0   Recent Labs    01/17/19 0007  WBC 10.2  RBC 4.43  HCT 39.8  PLT 322   Recent Labs    01/17/19 0007  NA 139  K 3.4*  CL 106  CO2 24  BUN 19  CREATININE 0.73  GLUCOSE 109*  CALCIUM 9.0   No results for input(s): LABPT, INR in the last 72 hours.  EXAM General - Patient is Alert, Appropriate and Oriented Extremity - Neurovascular intact Sensation intact distally Intact pulses distally Dorsiflexion/Plantar flexion intact No cellulitis present Compartment soft Dressing - dressing C/D/I and no drainage splint intact. Motor Function - intact, moving toes well on exam  Past Medical History:  Diagnosis Date  . 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 PONV (postoperative nausea and vomiting)     Assessment/Plan:   2 Days Post-Op Procedure(s) (LRB): OPEN REDUCTION INTERNAL FIXATION (ORIF) ANKLE FRACTURE (Right) Active Problems:   Closed right trimalleolar fracture, initial encounter  Estimated body mass index is 24.37 kg/m as calculated from the following:   Height as of this encounter: 5\' 9"  (1.753 m).   Weight as of this encounter: 74.8 kg. Advance diet Up with therapy, nonweightbearing  right lower extremity.  Patient making good progress with PT today. Pain well controlled Nausea resolved Tolerating p.o. well. Vital signs stable Follow-up with [facility] orthopedics first of next week.   DVT Prophylaxis - Lovenox and SCDs Nonweightbearing right lower extremity   T. Rachelle Hora, PA-C [place] 01/19/2019, 7:51 AM

## 2019-01-19 NOTE — Plan of Care (Signed)
  Problem: Education: Goal: Knowledge of General Education information will improve Description: Including pain rating scale, medication(s)/side effects and non-pharmacologic comfort measures Outcome: Progressing   Problem: Health Behavior/Discharge Planning: Goal: Ability to manage health-related needs will improve Outcome: Progressing   Problem: Clinical Measurements: Goal: Ability to maintain clinical measurements within normal limits will improve Outcome: Progressing Goal: Will remain free from infection Outcome: Progressing Goal: Cardiovascular complication will be avoided Outcome: Progressing   Problem: Activity: Goal: Risk for activity intolerance will decrease Outcome: Progressing   Problem: Nutrition: Goal: Adequate nutrition will be maintained Outcome: Progressing   Problem: Elimination: Goal: Will not experience complications related to bowel motility Outcome: Progressing Goal: Will not experience complications related to urinary retention Outcome: Progressing   Problem: Pain Managment: Goal: General experience of comfort will improve Outcome: Progressing   Problem: Safety: Goal: Ability to remain free from injury will improve Outcome: Progressing   Problem: Skin Integrity: Goal: Risk for impaired skin integrity will decrease Outcome: Progressing   

## 2019-01-19 NOTE — Progress Notes (Signed)
Patient is being discharged home today. DC & RX instructions given and patient acknowledged understanding. IV removed. Patient's sister will be here soon to pick her up. NT will wheel her out.

## 2020-05-18 IMAGING — DX PORTABLE RIGHT ANKLE - 2 VIEW
2 series · 2 of 2 positions shown · non-contrast
Comparison: 01/16/2011

CLINICAL DATA: Status post right ankle surgery

EXAM:
PORTABLE RIGHT ANKLE - 2 VIEW

[ankle ap (1 of 2)]
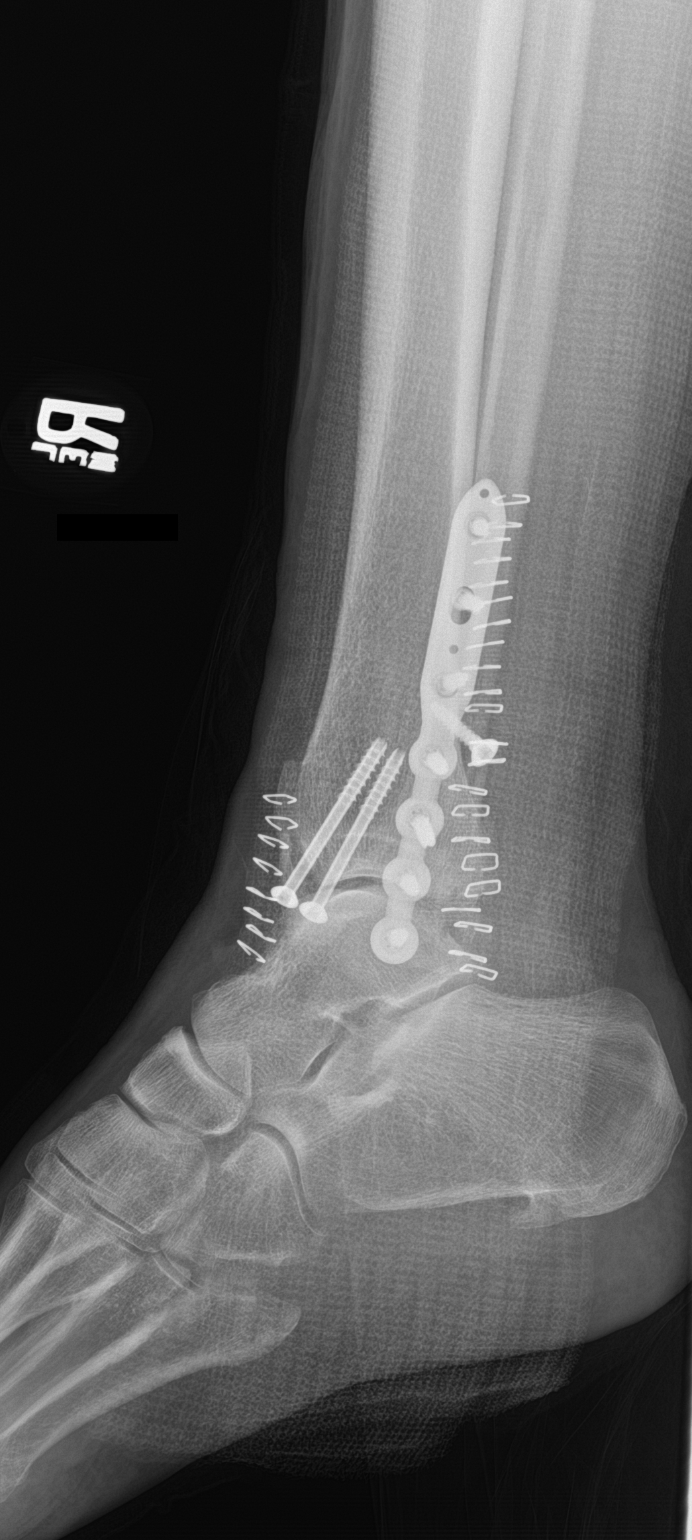

[ankle ap (2 of 2)]
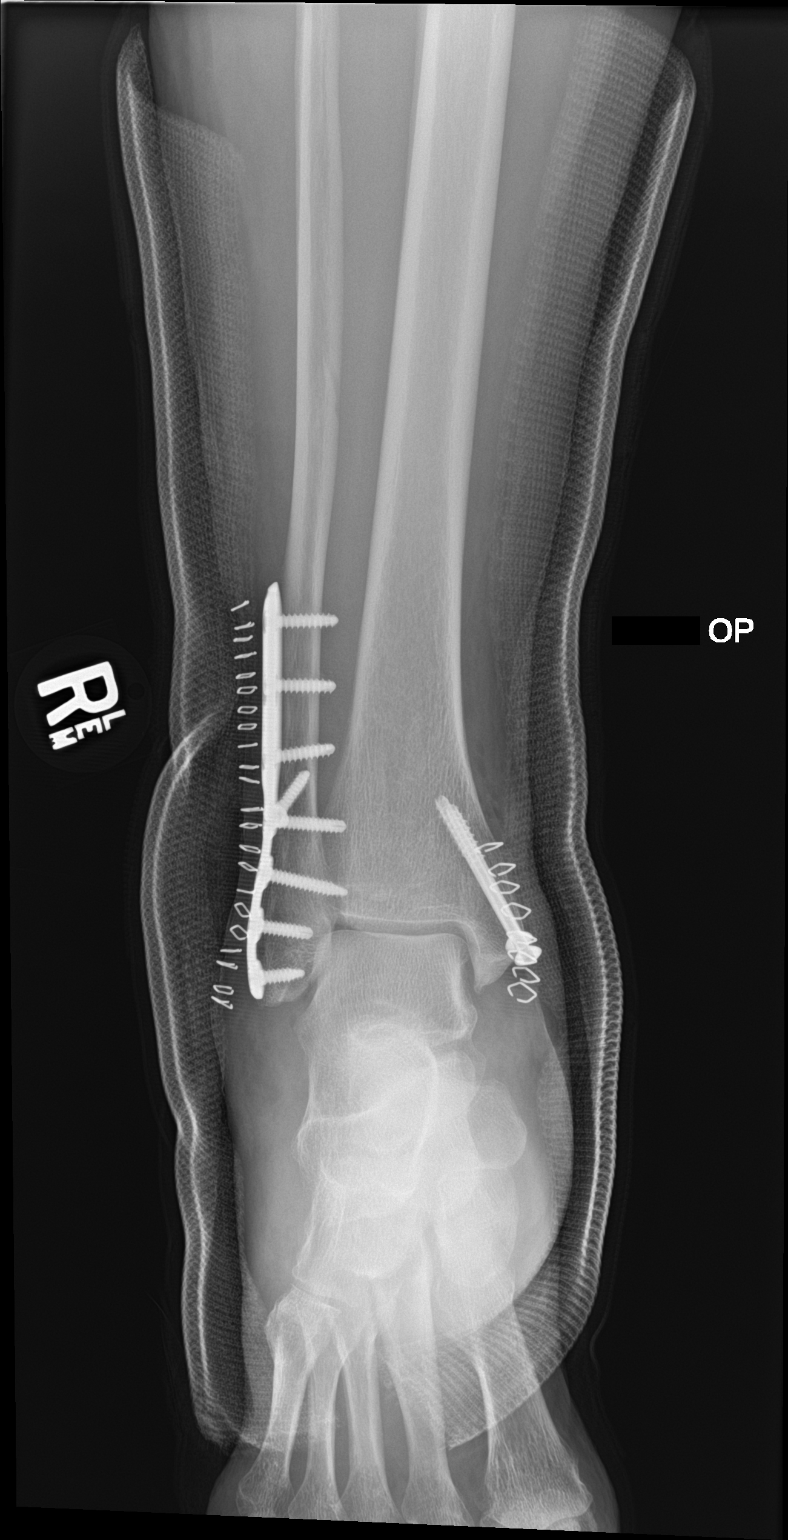

[2 of 2 positions shown; findings below may reference images not displayed]

FINDINGS: Medial malleolar fracture transfixed with 2 cannulated screws in
near anatomic alignment. Distal fibular fracture transfixed with a
lateral sideplate and multiple interlocking screws in anatomic
alignment. No other fracture or dislocation. Intact ankle mortise.
Soft tissue swelling around the ankle. Small plantar calcaneal spur.
IMPRESSION: Interval ORIF of a bimalleolar fracture.

## 2022-02-16 ENCOUNTER — Other Ambulatory Visit: Payer: Self-pay | Admitting: Surgery

## 2022-02-20 ENCOUNTER — Encounter
Admission: RE | Admit: 2022-02-20 | Discharge: 2022-02-20 | Disposition: A | Discharge status: Home / Self Care | Payer: PRIVATE HEALTH INSURANCE | Source: Ambulatory Visit | Attending: Surgery | Admitting: Surgery

## 2022-02-20 HISTORY — DX: Personal history of urinary calculi: Z87.442

## 2022-02-20 HISTORY — DX: Gastro-esophageal reflux disease without esophagitis: K21.9

## 2022-02-20 NOTE — Patient Instructions (Addendum)
Your procedure is scheduled on:02-21-22 Tuesday Report to the Registration Desk on the 1st floor of the [facility].Then proceed to the 2nd floor Surgery Desk To find out your arrival time, please call [phone] between 1PM - 3PM on:02-20-22 Monday If your arrival time is 6:00 am, do not arrive prior to that time as the [facility] entrance doors do not open until 6:00 am.  REMEMBER: Instructions that are not followed completely may result in serious medical risk, up to and including death; or upon the discretion of your surgeon and anesthesiologist your surgery may need to be rescheduled.  Do not eat food after midnight the night before surgery.  No gum chewing, lozengers or hard candies.  You may however, drink CLEAR liquids up to 2 hours before you are scheduled to arrive for your surgery. Do not drink anything within 2 hours of your scheduled arrival time.  Clear liquids include: - water  - apple juice without pulp - gatorade (not RED colors) - black coffee or tea (Do NOT add milk or creamers to the coffee or tea) Do NOT drink anything that is not on this list.  TAKE THESE MEDICATIONS THE MORNING OF SURGERY WITH A SIP OF WATER: -You may take HYDROcodone-Acetaminophen if needed the morning of surgery  One week prior to surgery: Stop Anti-inflammatories (NSAIDS) such as Advil, Aleve, Ibuprofen, Motrin, Naproxen, Naprosyn and Aspirin based products such as Excedrin, Goodys Powder, BC Powder.You may however, take Tylenol/Hydrocodone if needed for pain up until the day of surgery.  No Alcohol for 24 hours before or after surgery.  No Smoking including e-cigarettes for 24 hours prior to surgery.  No chewable tobacco products for at least 6 hours prior to surgery.  No nicotine patches on the day of surgery.  Do not use any "recreational" drugs for at least a week prior to your surgery.  Please be advised that the combination of cocaine and anesthesia may have negative outcomes, up  to and including death. If you test positive for cocaine, your surgery will be cancelled.  On the morning of surgery brush your teeth with toothpaste and water, you may rinse your mouth with mouthwash if you wish. Do not swallow any toothpaste or mouthwash.  Do not wear jewelry, make-up, hairpins, clips or nail polish.  Do not wear lotions, powders, or perfumes.   Do not shave body from the neck down 48 hours prior to surgery just in case you cut yourself which could leave a site for infection.  Also, freshly shaved skin may become irritated if using the CHG soap.  Contact lenses, hearing aids and dentures may not be worn into surgery.  Do not bring valuables to the hospital. [facility] is not responsible for any missing/lost belongings or valuables.   Notify your doctor if there is any change in your medical condition (cold, fever, infection).  Wear comfortable clothing (specific to your surgery type) to the hospital.  After surgery, you can help prevent lung complications by doing breathing exercises.  Take deep breaths and cough every 1-2 hours. Your doctor may order a device called an Incentive Spirometer to help you take deep breaths. When coughing or sneezing, hold a pillow firmly against your incision with both hands. This is called "splinting." Doing this helps protect your incision. It also decreases belly discomfort.  If you are being admitted to the hospital overnight, leave your suitcase in the car. After surgery it may be brought to your room.  If you are  being discharged the day of surgery, you will not be allowed to drive home. You will need a responsible adult (18 years or older) to drive you home and stay with you that night.   If you are taking public transportation, you will need to have a responsible adult (18 years or older) with you. Please confirm with your physician that it is acceptable to use public transportation.   Please call the [facility]  Dept. at [phone] if you have any questions about these instructions.  Surgery Visitation Policy:  Patients undergoing a surgery or procedure may have two family members or support persons with them as long as the person is not COVID-19 positive or experiencing its symptoms.

## 2022-02-21 ENCOUNTER — Ambulatory Visit: Payer: PRIVATE HEALTH INSURANCE | Admitting: Anesthesiology

## 2022-02-21 ENCOUNTER — Ambulatory Visit: Payer: PRIVATE HEALTH INSURANCE

## 2022-02-21 ENCOUNTER — Encounter: Payer: Self-pay | Admitting: Surgery

## 2022-02-21 ENCOUNTER — Other Ambulatory Visit: Payer: Self-pay

## 2022-02-21 ENCOUNTER — Encounter
Admission: RE | Disposition: A | Discharge status: Home / Self Care | Payer: Self-pay | Source: Home / Self Care | Attending: Surgery

## 2022-02-21 ENCOUNTER — Ambulatory Visit
Admission: RE | Admit: 2022-02-21 | Discharge: 2022-02-21 | Disposition: A | Discharge status: Home / Self Care | Payer: PRIVATE HEALTH INSURANCE | Attending: Surgery | Admitting: Surgery

## 2022-02-21 DIAGNOSIS — K219 Gastro-esophageal reflux disease without esophagitis: Secondary | ICD-10-CM | POA: Insufficient documentation

## 2022-02-21 DIAGNOSIS — S52611A Displaced fracture of right ulna styloid process, initial encounter for closed fracture: Secondary | ICD-10-CM | POA: Diagnosis present

## 2022-02-21 DIAGNOSIS — S52571A Other intraarticular fracture of lower end of right radius, initial encounter for closed fracture: Secondary | ICD-10-CM | POA: Insufficient documentation

## 2022-02-21 DIAGNOSIS — W010XXA Fall on same level from slipping, tripping and stumbling without subsequent striking against object, initial encounter: Secondary | ICD-10-CM | POA: Diagnosis not present

## 2022-02-21 HISTORY — PX: ORIF WRIST FRACTURE: SHX2133

## 2022-02-21 SURGERY — OPEN REDUCTION INTERNAL FIXATION (ORIF) WRIST FRACTURE
Anesthesia: General | Site: Wrist | Laterality: Right

## 2022-02-21 MED ORDER — PROPOFOL 10 MG/ML IV BOLUS
INTRAVENOUS | Status: DC | PRN
  Administered 2022-02-21: 130 mg via INTRAVENOUS

## 2022-02-21 MED ORDER — FAMOTIDINE 20 MG PO TABS
ORAL_TABLET | ORAL | Status: AC
  Administered 2022-02-21: 20 mg via ORAL
  Filled 2022-02-21: qty 1

## 2022-02-21 MED ORDER — CEFAZOLIN SODIUM-DEXTROSE 2-4 GM/100ML-% IV SOLN
INTRAVENOUS | Status: AC
  Filled 2022-02-21: qty 100

## 2022-02-21 MED ORDER — FENTANYL CITRATE (PF) 100 MCG/2ML IJ SOLN: 25.0000 ug | INTRAMUSCULAR | Status: DC | PRN

## 2022-02-21 MED ORDER — KETOROLAC TROMETHAMINE 30 MG/ML IJ SOLN
30.0000 mg | Freq: Once | INTRAMUSCULAR | Status: AC
  Administered 2022-02-21: 30 mg via INTRAVENOUS

## 2022-02-21 MED ORDER — DEXAMETHASONE SODIUM PHOSPHATE 10 MG/ML IJ SOLN
INTRAMUSCULAR | Status: DC | PRN
  Administered 2022-02-21: 10 mg via INTRAVENOUS

## 2022-02-21 MED ORDER — DEXAMETHASONE SODIUM PHOSPHATE 10 MG/ML IJ SOLN
INTRAMUSCULAR | Status: AC
  Filled 2022-02-21: qty 1

## 2022-02-21 MED ORDER — OXYCODONE HCL 5 MG/5ML PO SOLN: 5.0000 mg | Freq: Once | ORAL | Status: DC | PRN

## 2022-02-21 MED ORDER — DEXAMETHASONE SODIUM PHOSPHATE 4 MG/ML IJ SOLN
INTRAMUSCULAR | Status: DC | PRN
  Administered 2022-02-21: 2 mg via PERINEURAL

## 2022-02-21 MED ORDER — ACETAMINOPHEN 10 MG/ML IV SOLN
INTRAVENOUS | Status: AC
  Filled 2022-02-21: qty 100

## 2022-02-21 MED ORDER — CEFAZOLIN SODIUM-DEXTROSE 2-4 GM/100ML-% IV SOLN
2.0000 g | INTRAVENOUS | Status: AC
  Administered 2022-02-21: 2 g via INTRAVENOUS

## 2022-02-21 MED ORDER — ACETAMINOPHEN 10 MG/ML IV SOLN: 1000.0000 mg | Freq: Once | INTRAVENOUS | Status: DC | PRN

## 2022-02-21 MED ORDER — SUGAMMADEX SODIUM 200 MG/2ML IV SOLN: INTRAVENOUS | Status: DC | PRN

## 2022-02-21 MED ORDER — ACETAMINOPHEN 10 MG/ML IV SOLN
INTRAVENOUS | Status: DC | PRN
  Administered 2022-02-21: 1000 mg via INTRAVENOUS

## 2022-02-21 MED ORDER — SODIUM CHLORIDE (PF) 0.9 % IJ SOLN
INTRAMUSCULAR | Status: AC
  Filled 2022-02-21: qty 10

## 2022-02-21 MED ORDER — SUGAMMADEX SODIUM 200 MG/2ML IV SOLN
INTRAVENOUS | Status: DC | PRN
  Administered 2022-02-21: 200 mg via INTRAVENOUS

## 2022-02-21 MED ORDER — KETOROLAC TROMETHAMINE 30 MG/ML IJ SOLN
INTRAMUSCULAR | Status: AC
  Filled 2022-02-21: qty 1

## 2022-02-21 MED ORDER — CHLORHEXIDINE GLUCONATE 0.12 % MT SOLN: 15.0000 mL | Freq: Once | OROMUCOSAL | Status: AC

## 2022-02-21 MED ORDER — METOCLOPRAMIDE HCL 10 MG PO TABS: 5.0000 mg | ORAL_TABLET | Freq: Three times a day (TID) | ORAL | Status: DC | PRN

## 2022-02-21 MED ORDER — SODIUM CHLORIDE 0.9 % IV SOLN: INTRAVENOUS | Status: DC

## 2022-02-21 MED ORDER — BUPIVACAINE HCL (PF) 0.5 % IJ SOLN
INTRAMUSCULAR | Status: AC
  Filled 2022-02-21: qty 10

## 2022-02-21 MED ORDER — BUPIVACAINE HCL (PF) 0.5 % IJ SOLN
INTRAMUSCULAR | Status: DC | PRN
  Administered 2022-02-21: 10 mL via PERINEURAL

## 2022-02-21 MED ORDER — ONDANSETRON HCL 4 MG/2ML IJ SOLN
INTRAMUSCULAR | Status: DC | PRN
  Administered 2022-02-21: 4 mg via INTRAVENOUS

## 2022-02-21 MED ORDER — CHLORHEXIDINE GLUCONATE 0.12 % MT SOLN
OROMUCOSAL | Status: AC
  Administered 2022-02-21: 15 mL via OROMUCOSAL
  Filled 2022-02-21: qty 15

## 2022-02-21 MED ORDER — HYDROCODONE-ACETAMINOPHEN 5-300 MG PO TABS: 1.0000 | ORAL_TABLET | Freq: Four times a day (QID) | ORAL | 0 refills | Status: DC | PRN

## 2022-02-21 MED ORDER — ONDANSETRON HCL 4 MG/2ML IJ SOLN: 4.0000 mg | Freq: Once | INTRAMUSCULAR | Status: DC | PRN

## 2022-02-21 MED ORDER — LIDOCAINE HCL (CARDIAC) PF 100 MG/5ML IV SOSY
PREFILLED_SYRINGE | INTRAVENOUS | Status: DC | PRN
  Administered 2022-02-21: 60 mg via INTRAVENOUS

## 2022-02-21 MED ORDER — PROPOFOL 10 MG/ML IV BOLUS
INTRAVENOUS | Status: AC
  Filled 2022-02-21: qty 20

## 2022-02-21 MED ORDER — FAMOTIDINE 20 MG PO TABS: 20.0000 mg | ORAL_TABLET | Freq: Once | ORAL | Status: AC

## 2022-02-21 MED ORDER — ROCURONIUM BROMIDE 100 MG/10ML IV SOLN
INTRAVENOUS | Status: DC | PRN
  Administered 2022-02-21: 40 mg via INTRAVENOUS

## 2022-02-21 MED ORDER — LACTATED RINGERS IV SOLN: INTRAVENOUS | Status: DC

## 2022-02-21 MED ORDER — BUPIVACAINE HCL (PF) 0.5 % IJ SOLN
INTRAMUSCULAR | Status: DC | PRN
  Administered 2022-02-21: 10 mL

## 2022-02-21 MED ORDER — ONDANSETRON HCL 4 MG/2ML IJ SOLN
INTRAMUSCULAR | Status: AC
  Filled 2022-02-21: qty 2

## 2022-02-21 MED ORDER — OXYCODONE HCL 5 MG PO TABS: 5.0000 mg | ORAL_TABLET | Freq: Once | ORAL | Status: DC | PRN

## 2022-02-21 MED ORDER — METOCLOPRAMIDE HCL 5 MG/ML IJ SOLN: 5.0000 mg | Freq: Three times a day (TID) | INTRAMUSCULAR | Status: DC | PRN

## 2022-02-21 MED ORDER — 0.9 % SODIUM CHLORIDE (POUR BTL) OPTIME
TOPICAL | Status: DC | PRN
  Administered 2022-02-21: 1000 mL

## 2022-02-21 MED ORDER — FENTANYL CITRATE PF 50 MCG/ML IJ SOSY
PREFILLED_SYRINGE | INTRAMUSCULAR | Status: AC
  Administered 2022-02-21: 50 ug
  Filled 2022-02-21: qty 1

## 2022-02-21 MED ORDER — HYDROCODONE-ACETAMINOPHEN 5-325 MG PO TABS: 1.0000 | ORAL_TABLET | ORAL | Status: DC | PRN

## 2022-02-21 MED ORDER — FENTANYL CITRATE (PF) 100 MCG/2ML IJ SOLN
INTRAMUSCULAR | Status: DC | PRN
  Administered 2022-02-21: 50 ug via INTRAVENOUS

## 2022-02-21 MED ORDER — ONDANSETRON HCL 4 MG/2ML IJ SOLN: 4.0000 mg | Freq: Four times a day (QID) | INTRAMUSCULAR | Status: DC | PRN

## 2022-02-21 MED ORDER — FENTANYL CITRATE (PF) 100 MCG/2ML IJ SOLN
INTRAMUSCULAR | Status: AC
  Filled 2022-02-21: qty 2

## 2022-02-21 MED ORDER — PHENYLEPHRINE HCL-NACL 20-0.9 MG/250ML-% IV SOLN
INTRAVENOUS | Status: DC | PRN
  Administered 2022-02-21: 35 ug/min via INTRAVENOUS

## 2022-02-21 MED ORDER — MIDAZOLAM HCL 2 MG/2ML IJ SOLN
INTRAMUSCULAR | Status: AC
  Administered 2022-02-21: 1 mg
  Filled 2022-02-21: qty 2

## 2022-02-21 MED ORDER — LIDOCAINE HCL (PF) 2 % IJ SOLN
INTRAMUSCULAR | Status: AC
  Filled 2022-02-21: qty 5

## 2022-02-21 MED ORDER — ONDANSETRON HCL 4 MG PO TABS: 4.0000 mg | ORAL_TABLET | Freq: Four times a day (QID) | ORAL | Status: DC | PRN

## 2022-02-21 MED ORDER — ORAL CARE MOUTH RINSE: 15.0000 mL | Freq: Once | OROMUCOSAL | Status: AC

## 2022-02-21 SURGICAL SUPPLY — 73 items
APL PRP STRL LF DISP 70% ISPRP (MISCELLANEOUS) ×2
BIT DRILL 2.2 SS TIBIAL (BIT) ×2 IMPLANT
BNDG COHESIVE 4X5 TAN ST LF (GAUZE/BANDAGES/DRESSINGS) ×2 IMPLANT
BNDG ELASTIC 2X5.8 VLCR STR LF (GAUZE/BANDAGES/DRESSINGS) ×1 IMPLANT
BNDG ELASTIC 3X5.8 VLCR STR LF (GAUZE/BANDAGES/DRESSINGS) ×1 IMPLANT
BNDG ELASTIC 4X5.8 VLCR STR LF (GAUZE/BANDAGES/DRESSINGS) ×2 IMPLANT
BNDG ESMARK 4X12 TAN STRL LF (GAUZE/BANDAGES/DRESSINGS) ×2 IMPLANT
CAST PADDING 3X4FT ST 30246 (SOFTGOODS) ×1
CHLORAPREP W/TINT 26 (MISCELLANEOUS) ×4 IMPLANT
CORD BIP STRL DISP 12FT (MISCELLANEOUS) ×2 IMPLANT
CUFF TOURN SGL QUICK 18X4 (TOURNIQUET CUFF) IMPLANT
DRAPE FLUOR MINI C-ARM 54X84 (DRAPES) ×2 IMPLANT
DRAPE ORTHO SPLIT 77X108 STRL (DRAPES) ×2
DRAPE SURG 17X11 SM STRL (DRAPES) ×2 IMPLANT
DRAPE SURG ORHT 6 SPLT 77X108 (DRAPES) ×1 IMPLANT
DRAPE U-SHAPE 47X51 STRL (DRAPES) ×2 IMPLANT
ELECT CAUTERY BLADE 6.4 (BLADE) ×2 IMPLANT
ELECT REM PT RETURN 9FT ADLT (ELECTROSURGICAL) ×2
ELECTRODE REM PT RTRN 9FT ADLT (ELECTROSURGICAL) ×1 IMPLANT
FORCEPS JEWEL BIP 4-3/4 STR (INSTRUMENTS) ×2 IMPLANT
GAUZE SPONGE 4X4 12PLY STRL (GAUZE/BANDAGES/DRESSINGS) ×2 IMPLANT
GAUZE XEROFORM 1X8 LF (GAUZE/BANDAGES/DRESSINGS) ×2 IMPLANT
GLOVE BIO SURGEON STRL SZ8 (GLOVE) ×2 IMPLANT
GLOVE SURG UNDER LTX SZ8 (GLOVE) ×2 IMPLANT
GOWN STRL REUS W/ TWL LRG LVL3 (GOWN DISPOSABLE) ×1 IMPLANT
GOWN STRL REUS W/ TWL XL LVL3 (GOWN DISPOSABLE) ×1 IMPLANT
GOWN STRL REUS W/TWL LRG LVL3 (GOWN DISPOSABLE) ×2
GOWN STRL REUS W/TWL XL LVL3 (GOWN DISPOSABLE) ×2
K-WIRE 1.6 (WIRE) ×4
K-WIRE FX5X1.6XNS BN SS (WIRE) ×2
KIT TURNOVER KIT A (KITS) ×2 IMPLANT
KWIRE FX5X1.6XNS BN SS (WIRE) IMPLANT
MANIFOLD NEPTUNE II (INSTRUMENTS) ×2 IMPLANT
NDL FILTER BLUNT 18X1 1/2 (NEEDLE) ×1 IMPLANT
NEEDLE FILTER BLUNT 18X 1/2SAF (NEEDLE) ×1
NEEDLE FILTER BLUNT 18X1 1/2 (NEEDLE) ×1 IMPLANT
NS IRRIG 500ML POUR BTL (IV SOLUTION) ×2 IMPLANT
PACK EXTREMITY ARMC (MISCELLANEOUS) ×2 IMPLANT
PAD CAST CTTN 3X4 STRL (SOFTGOODS) IMPLANT
PADDING CAST 4IN STRL (MISCELLANEOUS) ×2
PADDING CAST BLEND 4X4 STRL (MISCELLANEOUS) ×2 IMPLANT
PADDING CAST COTTON 2X4 NS (CAST SUPPLIES) ×1 IMPLANT
PADDING CAST COTTON 3X4 STRL (SOFTGOODS) ×1
PEG LOCKING SMOOTH 2.2X13 (Peg) ×1 IMPLANT
PEG LOCKING SMOOTH 2.2X16 (Screw) ×2 IMPLANT
PEG LOCKING SMOOTH 2.2X18 (Peg) ×1 IMPLANT
PEG LOCKING SMOOTH 2.2X20 (Screw) ×1 IMPLANT
PLATE CROSSLOCK NAR MINI RT (Plate) ×1 IMPLANT
PUTTY DBX 1CC (Putty) ×2 IMPLANT
PUTTY DBX 1CC DEPUY (Putty) IMPLANT
SCREW  LP NL 2.7X13MM (Screw) ×2 IMPLANT
SCREW 2.7X14MM (Screw) ×1 IMPLANT
SCREW BN 14X2.7XNONLOCK 3 LD (Screw) IMPLANT
SCREW LOCK 14X2.7X 3 LD TPR (Screw) IMPLANT
SCREW LOCKING 2.7X14 (Screw) ×4 IMPLANT
SCREW LP NL 2.7X13MM (Screw) IMPLANT
SCREW LP NL 2.7X22MM (Screw) ×1 IMPLANT
SCREW NLOCK 2.7X14 (Screw) ×1 IMPLANT
SCREW NONLOCK 2.7X20MM (Screw) ×1 IMPLANT
SLING ARM M TX990204 (SOFTGOODS) ×1 IMPLANT
SPLINT CAST 1 STEP 3X12 (MISCELLANEOUS) IMPLANT
SPLINT CAST 1 STEP 4X15 (MISCELLANEOUS) IMPLANT
STAPLER SKIN PROX 35W (STAPLE) ×2 IMPLANT
STOCKINETTE IMPERVIOUS 9X36 MD (GAUZE/BANDAGES/DRESSINGS) ×2 IMPLANT
STRIP CLOSURE SKIN 1/4X4 (GAUZE/BANDAGES/DRESSINGS) IMPLANT
SUT PROLENE 4 0 PS 2 18 (SUTURE) ×2 IMPLANT
SUT VIC AB 2-0 SH 27 (SUTURE) ×2
SUT VIC AB 2-0 SH 27XBRD (SUTURE) ×1 IMPLANT
SUT VIC AB 3-0 SH 27 (SUTURE) ×2
SUT VIC AB 3-0 SH 27X BRD (SUTURE) ×1 IMPLANT
SWABSTK COMLB BENZOIN TINCTURE (MISCELLANEOUS) IMPLANT
SYR 10ML LL (SYRINGE) ×2 IMPLANT
WATER STERILE IRR 500ML POUR (IV SOLUTION) ×2 IMPLANT

## 2022-02-21 NOTE — Discharge Instructions (Addendum)
Orthopedic discharge instructions: Keep splint dry and intact. Keep hand elevated above heart level. Apply ice to affected area frequently. Take ibuprofen 600-800 mg TID with meals for 7-10 days, then as necessary.   TID = 3 TIMES PER DAY/EVERY 8 HRS - START AT 11 PM (YOU RECEIVED NONSTERROIDAL ANTIINFLAMMATORY MEDICATION TORADOL AT [place] AT 3 PM. Take pain medication as prescribed or ES Tylenol when needed.  (TYLENOL 1,000 MG GIVEN AT HOSPITAL AT 2:19 PM; NEXT DOSE, IF NEEDED CAN BE TAKEN PER MANUFACTURER'S INSTRCTIONS. Return for follow-up in 10-14 days or as scheduled.   AMBULATORY SURGERY  DISCHARGE INSTRUCTIONS   The drugs that you were given will stay in your system until tomorrow so for the next 24 hours you should not:  Drive an automobile Make any legal decisions Drink any alcoholic beverage   You may resume regular meals tomorrow.  Today it is better to start with liquids and gradually work up to solid foods.  You may eat anything you prefer, but it is better to start with liquids, then soup and crackers, and gradually work up to solid foods.   Please notify your doctor immediately if you have any unusual bleeding, trouble breathing, redness and pain at the surgery site, drainage, fever, or pain not relieved by medication.    Additional Instructions:        Please contact your physician with any problems or Same Day Surgery at [phone], Monday through Friday 6 am to 4 pm, or [place] at [facility] number at [phone].

## 2022-02-21 NOTE — Op Note (Signed)
02/21/2022  3:07 PM  Patient:   Alice Taylor  Pre-Op Diagnosis:   Closed acute intra-articular distal radius fracture with ulnar styloid fracture, right wrist.  Post-Op Diagnosis:   Same.  Procedure:   Open reduction and internal fixation of right distal radius fracture.  Surgeon:   Pascal Lux, MD  Assistant:   None  Anesthesia:   GET with interscalene block placed preoperatively by the anesthesiologist.  Findings:   As above.  Complications:   None  EBL:   3 cc  Fluids:   800 cc crystalloid  TT:   75 minutes at 250 mmHg  Drains:   None  Closure:   3-0 Vicryl subcuticular sutures  Implants:   Biomet DVR Cross-locked narrow precontoured distal radius mini-locking plate.  Brief Clinical Note:   The patient is a 61 year old female who sustained the above-noted injury 4 days ago when she apparently tripped and fell while running away from a groundhog. X-rays in the office confirmed the presence of a displaced intra-articular fracture of her right distal radius with an ulnar styloid fracture. She was placed into a sugar-tong splint and presents at this time for definitive management of her injury.  Procedure:   The patient underwent placement of an interscalene block in the preoperative holding area by the anesthesiologist before she was brought into the operating room and lain in the supine position. After adequate general endotracheal intubation and anesthesia was obtained, the patient's right hand and upper extremity were prepped with ChloraPrep solution before being draped sterilely. Preoperative antibiotics were administered. A timeout was performed to verify the appropriate surgical site before the limb was exsanguinated with an Esmarch and the tourniquet inflated to 250 mmHg.   An approximately 7-8 cm incision was made over the volar aspect of the distal radius beginning at the volar flexion crease and extending proximally along the flexor carpi radialis tendon. The  incision was carried down through the subcutaneous tissues to expose the superficial retinaculum. This was split the length of the incision directly over the flexor carpi radialis tendon. The FCR sheath was opened and the tendon retracted ulnarly to protect the median nerve. The floor of the FCR sheath was opened to expose the pronator quadratus muscle. This was released along the radial insertion and the muscle was retracted ulnarly to expose the distal radius. The fracture was identified and soft tissues elevated off the distal metaphyseal region for several centimeters.   The appropriate sized plate was selected and positioned on the distal radius. A guidewire was placed through the distal hole and its position verified using FluoroScan imaging in AP and lateral projections. After several attempts, the pin was positioned parallel to the distal articular surface and approximately 3-4 mm proximal to the articular surface. One bicortical screw and several locking pegs were placed into the distal fragments with care taken to avoid disrupting the intra-articular portion of the fracture. The plate was carefully lowered onto the volar metaphyseal surface, reducing the fracture in the process. Again the position of the plate was verified using FluoroScan imaging in AP and lateral projections and found to be excellent. The plate was secured using a nonlocking bicortical screw proximally.   The other distal holes were filled with locking pegs of the appropriate lengths. The adequacy of hardware position and fracture reduction was verified using FluoroScan imaging in AP, lateral, and several additional oblique projections to be sure that the hardware did not enter the joint nor did it penetrate dorsally. One additional bicortical  nonlocking screw was placed proximally followed by two locking cortical screws to secure the plate to the metaphyseal region proximally. Again the construct was assessed using FluoroScan  imaging in AP, lateral, and oblique projections and found to be excellent.  The wound was copiously irrigated with sterile saline solution before the pronator quadratus was reapproximated using 2-0 Vicryl interrupted sutures. The subcutaneous tissues were closed using 2-0 Vicryl interrupted sutures before the subcuticular layer was closed using 3-0 Vicryl inverted interrupted sutures. Benzoin and Steri-Strips are applied to the skin. A total of 10 cc of 0.5% plain Sensorcaine was injected in and around the incision site to help with postoperative analgesia before a sterile bulky dressing was applied to the wound. The patient was placed into a volar splint maintaining the wrist in neutral position before the patient was awakened, extubated, and returned to the recovery room in satisfactory condition after tolerating the procedure well.

## 2022-02-21 NOTE — Anesthesia Procedure Notes (Signed)
Anesthesia Regional Block: Supraclavicular block   Pre-Anesthetic Checklist: , timeout performed,  Correct Patient, Correct Site, Correct Laterality,  Correct Procedure, Correct Position, site marked,  Risks and benefits discussed,  Surgical consent,  Pre-op evaluation,  At surgeon's request and post-op pain management  Laterality: Right  Prep: chloraprep       Needles:  Injection technique: Single-shot  Needle Type: Echogenic Needle     Needle Length: 4cm  Needle Gauge: 25     Additional Needles:   Procedures:,,,, ultrasound used (permanent image in chart),,    Narrative:  Injection made incrementally with aspirations every 5 mL.  Performed by: Personally  Anesthesiologist: Arita Miss, MD  Additional Notes: Patient's chart reviewed and they were deemed appropriate candidate for procedure, per surgeon's request. Patient educated about risks, benefits, and alternatives of the block including but not limited to: temporary or permanent nerve damage, hemidiaphragmatic paralysis leading to dyspnea, pneumothorax, bleeding, infection, damage to surround tissues, block failure, local anesthetic toxicity. Patient expressed understanding. A formal time-out was conducted consistent with institution rules.  Monitors were applied, and minimal sedation used (see nursing record). The site was prepped with skin prep and allowed to dry, and sterile gloves were used. A high frequency linear ultrasound probe with probe cover was utilized throughout. Supraclavicular artery visualized, along with the brachial plexus adjacent to it and appeared anatomically normal. Local anesthetic injected around the plexus, and echogenic block needle trajectory was monitored throughout. Aspiration performed every 27m. Lung and blood vessels were avoided. All injections were performed without resistance and free of blood and paresthesias. The patient tolerated the procedure well.  Injectate: 279m0.25% bupvacaine + 2  mg decadron

## 2022-02-21 NOTE — H&P (Signed)
History of Present Illness:  Alice Taylor is a 61 y.o. female who presents for evaluation and treatment of her right wrist injury. Apparently, earlier this morning, the patient tripped and fell onto her outstretched right hand while trying to run away from a groundhog. Apparently, her family dog had to the groundhog and its mouth. She was chasing after the dog to let the groundhog go. The dog did let the groundhog go and it turned and ran toward the patient, causing her to fall as she tried to get out of the way. The patient notes significant pain in her wrist which she rates at 10/10. She also notes some paresthesias to her index finger. She denies any prior injury to the wrist or hand.  Current Outpatient Medications: cholecalciferol (VITAMIN D3) 400 unit tablet Take 400 Units by mouth once daily  COLLAGEN MISC Take by mouth   Allergies:  Latex Hives  Oxycodone Anxiety  Codeine Rash  Neosporin [Neomycin-Polymyxin] Rash  Blisters   Past Medical History: No past medical history on file.  Past Surgical History:  Right knee arthroscopy 1996 (Dr. Marry Guan)  Left knee scopy, partial medial meniscectomy 04/24/2018 (Dr Marry Guan)  ORIF ANKLE FRACTURE Right 01/17/2019 (Dr Rudene Christians)   Family History:  High blood pressure (Hypertension) Mother  Stroke Mother  COPD Father  Heart disease Father  COPD Sister  Hyperlipidemia (Elevated cholesterol) Sister  Heart disease Brother  COPD Brother   Social History:   Socioeconomic History:  Marital status: Married  Spouse name: Tommy  Number of children: 0  Years of education: 35  Occupational History  Occupation: Farming  Tobacco Use  Smoking status: Never  Smokeless tobacco: Never  Surveyor, mining Use: Never used  Substance and Sexual Activity  Alcohol use: No  Drug use: No  Sexual activity: Defer  Partners: Male   Review of Systems:  A comprehensive 14 point ROS was performed, reviewed, and the pertinent orthopaedic findings are  documented in the HPI.  Physical Exam: Vitals:  02/16/22 1343  BP: (!) 142/98  Weight: 77.1 kg (170 lb)  Height: 175.3 cm ('5\' 9"'$ )  PainSc: 10-Worst pain ever  PainLoc: Wrist   General/Constitutional: The patient appears to be well-nourished, well-developed, and in no acute distress. Neuro/Psych: Normal mood and affect, oriented to person, place and time. Eyes: Non-icteric. Pupils are equal, round, and reactive to light, and exhibit synchronous movement. ENT: Unremarkable. Lymphatic: No palpable adenopathy. Respiratory: Lungs clear to auscultation, Normal chest excursion, No wheezes, and Non-labored breathing Cardiovascular: Regular rate and rhythm. No murmurs. and No edema, swelling or tenderness, except as noted in detailed exam. Integumentary: No impressive skin lesions present, except as noted in detailed exam. Musculoskeletal: Unremarkable, except as noted in detailed exam.  Right wrist exam: Inspection of the right wrist is notable for an obvious deformity, as well as mild swelling, but otherwise is unremarkable. No erythema, ecchymosis, abrasions, or other skin abnormalities identified. She has moderate to severe pain with any attempted active or passive motion of the wrist, as well as to palpation over the distal radius region. She is able to flex and extend all digits, although motion is limited secondary to pain and apprehension. She is neurovascular intact to all digits.  X-rays/MRI/Lab data:  AP, lateral, and oblique views of the right wrist are obtained. These films demonstrate a comminuted intra-articular fracture of the right distal radius with loss of radial inclination and volar tilt. There also is a minimally displaced ulnar styloid process fracture. No significant  degenerative changes of the wrist are identified. No lytic lesions or other acute bony abnormalities are noted.  Assessment: 1. Closed Colles' fracture of right radius.   Plan: The treatment options were  discussed with the patient and her family. In addition, patient educational materials were provided regarding the diagnosis and treatment options. The patient is quite active normally. Therefore, I have recommended that this injury be managed surgically with an open reduction and internal fixation of the right distal radius fracture. The procedure was discussed with the patient, as were the potential risks (including bleeding, infection, nerve and/or blood vessel injury, persistent or recurrent pain, loosening and/or failure of the components, dislocation, malunion, non-union, limb length inequality, need for further surgery, blood clots, strokes, heart attacks and/or arhythmias, pneumonia, etc.) and benefits. The patient states her understanding and wishes to proceed. All of the patient's questions and concerns were answered. She can call any time with further concerns. She will follow up post-surgery, routine.   H&P reviewed and patient re-examined. No changes.

## 2022-02-21 NOTE — Transfer of Care (Signed)
Immediate Anesthesia Transfer of Care Note  Patient: Alice Taylor  Procedure(s) Performed: OPEN REDUCTION INTERNAL FIXATION (ORIF) OR RIGHT DISTAL RADIUS FRACTURE. (Right: Wrist)  Patient Location: PACU  Anesthesia Type:General  Level of Consciousness: awake  Airway & Oxygen Therapy: Patient Spontanous Breathing  Post-op Assessment: Report given to RN  Post vital signs: stable  Last Vitals:  Vitals Value Taken Time  BP    Temp    Pulse    Resp    SpO2      Last Pain:  Vitals:   02/21/22 1112  TempSrc: Temporal  PainSc: 6          Complications: No notable events documented.

## 2022-02-21 NOTE — Anesthesia Procedure Notes (Signed)
Procedure Name: Intubation Date/Time: 02/21/2022 1:10 PM  Performed by: Loletha Grayer, CRNAPre-anesthesia Checklist: Patient identified, Patient being monitored, Timeout performed, Emergency Drugs available and Suction available Patient Re-evaluated:Patient Re-evaluated prior to induction Oxygen Delivery Method: Circle system utilized Preoxygenation: Pre-oxygenation with 100% oxygen Induction Type: IV induction Ventilation: Mask ventilation without difficulty Laryngoscope Size: Miller and 2 Grade View: Grade I Tube type: Oral Tube size: 6.5 mm Number of attempts: 1 Airway Equipment and Method: Stylet Placement Confirmation: ETT inserted through vocal cords under direct vision, positive ETCO2 and breath sounds checked- equal and bilateral Secured at: 20 cm Tube secured with: Tape Dental Injury: Teeth and Oropharynx as per pre-operative assessment

## 2022-02-21 NOTE — Anesthesia Preprocedure Evaluation (Addendum)
Anesthesia Evaluation  Patient identified by MRN, date of birth, ID band Patient awake    Reviewed: Allergy & Precautions, NPO status , Patient's Chart, lab work & pertinent test results  History of Anesthesia Complications (+) PONV and history of anesthetic complications  Airway Mallampati: II  TM Distance: >3 FB Neck ROM: Full    Dental no notable dental hx. (+) Teeth Intact   Pulmonary neg pulmonary ROS, neg sleep apnea, neg COPD, Patient abstained from smoking.Not current smoker,    Pulmonary exam normal breath sounds clear to auscultation       Cardiovascular Exercise Tolerance: Good METS(-) hypertension(-) CAD and (-) Past MI negative cardio ROS  (-) dysrhythmias  Rhythm:Regular Rate:Normal - Systolic murmurs    Neuro/Psych negative neurological ROS  negative psych ROS   GI/Hepatic GERD  Poorly Controlled,(+)     (-) substance abuse  ,   Endo/Other  neg diabetes  Renal/GU negative Renal ROS     Musculoskeletal   Abdominal   Peds  Hematology   Anesthesia Other Findings Past Medical History: 1970: Cancer ([place])     Comment:  Skin No date: GERD (gastroesophageal reflux disease) No date: History of kidney stones No date: PONV (postoperative nausea and vomiting)     Comment:  x1 after orif ankle surgery  Reproductive/Obstetrics                             Anesthesia Physical Anesthesia Plan  ASA: 2  Anesthesia Plan: General   Post-op Pain Management: Ofirmev IV (intra-op)*, Toradol IV (intra-op)* and Regional block*   Induction: Intravenous and Rapid sequence  PONV Risk Score and Plan: 4 or greater and Ondansetron, Dexamethasone and Midazolam  Airway Management Planned: Oral ETT  Additional Equipment: None  Intra-op Plan:   Post-operative Plan: Extubation in OR  Informed Consent: I have reviewed the patients History and Physical, chart, labs and discussed the  procedure including the risks, benefits and alternatives for the proposed anesthesia with the patient or authorized representative who has indicated his/her understanding and acceptance.     Dental advisory given  Plan Discussed with: CRNA and Surgeon  Anesthesia Plan Comments: (Plan for GETA due to poorly controlled and not regularly medicated GERD. Discussed risks of anesthesia with patient, including PONV, sore throat, lip/dental/eye damage. Rare risks discussed as well, such as cardiorespiratory and neurological sequelae, and allergic reactions. Discussed the role of CRNA in patient's perioperative care. Patient understands. Discussed r/b/a of supraclavicular nerve block, including:  - bleeding, infection, nerve damage - pneumothorax - shortness of breath from hemidiaphragmatic paralysis due to phrenic nerve blockade - poor or non functioning block. - reactions and toxicity to local anesthetic Patient understands. )        Anesthesia Quick Evaluation

## 2022-02-22 ENCOUNTER — Encounter: Payer: Self-pay | Admitting: Surgery

## 2022-02-22 NOTE — Anesthesia Postprocedure Evaluation (Signed)
Anesthesia Post Note  Patient: Alice Taylor  Procedure(s) Performed: OPEN REDUCTION INTERNAL FIXATION (ORIF) OR RIGHT DISTAL RADIUS FRACTURE. (Right: Wrist)  Patient location during evaluation: Other (phone call) Anesthesia Type: General and Regional Level of consciousness: awake and alert Pain management: pain level controlled Vital Signs Assessment: post-procedure vital signs reviewed and stable Respiratory status: spontaneous breathing, nonlabored ventilation, respiratory function stable and patient connected to nasal cannula oxygen Cardiovascular status: blood pressure returned to baseline and stable Postop Assessment: no apparent nausea or vomiting Anesthetic complications: no Comments: Received 17hrs of relief from supraclav block, very thankful.    No notable events documented.   Last Vitals:  Vitals:   02/21/22 1515 02/21/22 1535  BP: 139/76 130/80  Pulse: 90 75  Resp: 20 18  Temp: 36.7 C 36.4 C  SpO2: 94% 95%    Last Pain:  Vitals:   02/21/22 1535  TempSrc: Temporal  PainSc: 0-No pain                 Arita Miss

## 2022-05-16 ENCOUNTER — Encounter: Payer: Self-pay | Admitting: Occupational Therapy

## 2022-05-16 ENCOUNTER — Ambulatory Visit: Payer: PRIVATE HEALTH INSURANCE | Attending: Student | Admitting: Occupational Therapy

## 2022-05-16 DIAGNOSIS — M25641 Stiffness of right hand, not elsewhere classified: Secondary | ICD-10-CM | POA: Diagnosis not present

## 2022-05-16 DIAGNOSIS — M6281 Muscle weakness (generalized): Secondary | ICD-10-CM | POA: Insufficient documentation

## 2022-05-16 DIAGNOSIS — M79641 Pain in right hand: Secondary | ICD-10-CM | POA: Diagnosis present

## 2022-05-16 DIAGNOSIS — M25631 Stiffness of right wrist, not elsewhere classified: Secondary | ICD-10-CM | POA: Insufficient documentation

## 2022-05-16 NOTE — Therapy (Signed)
[place] [facility] [address] [place], [address] Phone: [phone]   Fax:  [phone]  Occupational Therapy Evaluation  Patient Details  Name: Alice Taylor MRN: [id number] Date of Birth: 10-04-1960 Referring Provider (OT): [place] PA   Encounter Date: 05/16/2022   OT End of Session - 05/16/22 2141     Visit Number 1    Number of Visits 6    Date for OT Re-Evaluation 06/27/22    OT Start Time 1205    OT Stop Time 1302    OT Time Calculation (min) 57 min    Activity Tolerance Patient tolerated treatment well    Behavior During Therapy [facility] for tasks assessed/performed             Past Medical History:  Diagnosis Date   Cancer ([place]) 1970   Skin   GERD (gastroesophageal reflux disease)    History of kidney stones    PONV (postoperative nausea and vomiting)    x1 after orif ankle surgery    Past Surgical History:  Procedure Laterality Date   APPENDECTOMY     KNEE ARTHROSCOPY Right    KNEE ARTHROSCOPY Left 04/24/2018   Procedure: ARTHROSCOPY KNEE, PARTIAL MEDIAL MENISCECTOMY;  Surgeon: Dereck Leep, MD;  Location: [facility] ORS;  Service: Orthopedics;  Laterality: Left;   ORIF ANKLE FRACTURE Right 01/17/2019   Procedure: OPEN REDUCTION INTERNAL FIXATION (ORIF) ANKLE FRACTURE;  Surgeon: Hessie Knows, MD;  Location: [facility] ORS;  Service: Orthopedics;  Laterality: Right;   ORIF WRIST FRACTURE Right 02/21/2022   Procedure: OPEN REDUCTION INTERNAL FIXATION (ORIF) OR RIGHT DISTAL RADIUS FRACTURE.;  Surgeon: Corky Mull, MD;  Location: [facility] ORS;  Service: Orthopedics;  Laterality: Right;    There were no vitals filed for this visit.   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 I am R handed but my fingers are stiff and it hurts when the therapist bend them - by the end of the day increase pain in hand - has  to take pain medication. My wrist still stiff and cannot bend or twist it. And I am close to 3 months out from surgery    Pertinent History Pt last seen by PA at Ortho on 04/03/22 - Alice Taylor is a 61 y.o. female who presents for follow-up now 6 weeks status post an open reduction and internal fixation of her right distal radius fracture with an ulnar styloid fracture. Overall, the patient feels that she is doing well. She still notes some discomfort in her wrist which she rates at 4/10 on today's visit, and for which she has been taking Tylenol and applying ice as necessary with temporary partial relief of her symptoms. She been wearing her Velcro wrist immobilizer on a regular basis, removing it for bathing purposes and for exercises. She still notes moderate stiffness in the wrist and fingers, as well as some swelling, but denies any reinjury to the hand or wrist. 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  As well as painting and being able to decorate at different events.    Currently in Pain? Yes    Pain Score 3    Can increase to 8/10 with passive range of motion of the fingers and wrist.   Pain Location Hand   Wrist   Pain Orientation Right    Pain Descriptors / Indicators Tender;Tightness;Aching;Sore    Pain Type Surgical pain    Pain Onset More than a month ago    Pain Frequency Intermittent               [facility] OT Assessment - 05/16/22 0001       Assessment   Medical Diagnosis R colles fx distal radius ,ORIF    Referring Provider (OT) Mc[place] PA    Onset Date/Surgical Date  02/21/22    Hand Dominance Right    Next MD Visit 05/19/22    Prior Therapy PT in [place] 3 x wk      Home  Environment   Lives With Spouse      Prior Function   Leisure Cattle farmer, farm act, painting , decorate different events      AROM   Right Forearm Pronation 90 Degrees    Right Forearm Supination 45 Degrees    Right Wrist Extension 37 Degrees   In session 46   Right Wrist Flexion 48 Degrees   Incision 52   Right Wrist Radial Deviation 18 Degrees   In session 25   Right Wrist Ulnar Deviation 7 Degrees      Right Hand AROM   R Index  MCP 0-90 80 Degrees    R Index PIP 0-100 65 Degrees    R Long  MCP 0-90 80 Degrees    R Long PIP 0-100 75 Degrees    R Ring  MCP 0-90 80 Degrees    R Ring PIP 0-100 80 Degrees    R Little  MCP 0-90 75 Degrees    R Little PIP 0-100 80 Degrees                 Recommended for patient to do contrast prior to her home exercises to 3 times a day not yet only moist heat.   Incision patient was educated on active assisted range of motion for right wrist flexion extension as well as radial ulnar deviation over the edge of table 10 reps prior to using 1 pound weight in all planes 10-12 reps pain-free. Also educated in passive range of motion for supination using heating pad for 3 x 30 seconds stretch followed by 1 pound weight for supination and pronation. Patient to address thumb palmar radial abduction 10 reps each As well as reviewed with patient tendon glides with gentle active assisted range of motion for fifth MC during lumbrical fist 10 reps followed by intrinsic a fist blocked with great results and then composite fist touching  highlighter out of palm 12 reps and in a couple of days if increased motion can go to 1 finger out of palm, followed by touching palm. All home exercises should be a slight pull or stretch less than a 2/10 pain. Done CPM this date for wrist flexion and extension with great results as well as  AAROM review -with  great progress in range of motion in all planes for right wrist and forearm. Patient also showed  improvement in composite fist with out any passive range of motion with only active assisted range of motion and pain less than a 2/10.               OT Education - 05/16/22 2140     Education Details Findings of evaluation and home program    Person(s) Educated Patient    Methods Explanation;Demonstration;Tactile cues;Verbal cues;Handout    Comprehension Verbal cues required;Returned demonstration;Verbalized understanding              OT Short Term Goals - 05/16/22 2146       OT SHORT TERM GOAL #1   Title Patient to be independent in home program for active assisted range of motion for wrist and forearm in all planes to show progress to be able to upgrade patient to 2 pound weight.    Baseline Patient show flexion 48, extension of wrist 37, ulnar deviation 7 and radial deviation 18 with supination at 45.  Just started per patient about a week ago 1 pound weight.    Time 3    Period Weeks    Status New    Target Date 06/06/22               OT Long Term Goals - 05/16/22 2148       OT LONG TERM GOAL #1   Title Right wrist and forearm active range of motion increased to within functional limits for patient to turn a doorknob as well as push and pull heavy door with no increase symptoms.    Baseline Supination 45, extension 37, flexion 48, ulnar deviation 7 and radial deviation 18 with some discomfort and pain at end range.  Patient can only do 1 pound weight that started about a week ago per patient    Time 6    Period Weeks    Status New    Target Date 06/27/22      OT LONG TERM GOAL #2   Title Right composite fist improved for patient to be able to touch palm and use tools and grip steering wheel without increase symptoms    Baseline MC flexions 75-80 degrees, PIP flexion 65-85 degrees    Time 4    Period Weeks    Status New    Target Date 06/13/22      OT  LONG TERM GOAL #3   Title Right grip and prehension strength improved to more than 75% compared to left hand to be able to carry more than 10 pounds as well 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    Baseline Not tested at this time patient came for evaluation today we will assess next time    Time 6    Period Weeks    Status New    Target Date 06/27/22                   Plan - 05/16/22 2141     Clinical Impression Statement Patient presented OT evaluation with a referral from orthopedics for possible splinting for stiffness of digits.  Patient has a diagnosis of postop Colles' fracture R distal radius  ORIF 12 wks ago - on 02/21/22.  Patient was receiving 3 times a week 4 a month PT in [place].  Patient presented OT evaluation with severe stiffness in wrist flexion extension as well as ulnar deviation and pronation.  Increased stiffness and intrinsic a fist resulting in limited and impaired composite fist.  At this  time would not recommend any splinting for digit.  In session OT educated patient on active assisted range of motion and passive range of motion for flexion extension as well as ulnar radial deviation with great results and progress as well as pronation.  Followed by 1 pound weight in all planes.  Patient showed great progress in session.  Reviewed with patient home program to do at home to 3 times a day.  Did not use any modalities and patient showed increased digit flexion and composite fist with only active range of motion and active assisted range of motion with pain less than a 2/10.  Patient to do contrast for swelling and pain and fitted with a Isotoner glove that she can try and use at nighttime.  Would recommend patient strongly to maybe try and follow-up with me once a week if progressing well to address stiffness in wrist and forearm in all planes as well as upgrading strength as needed and addressing composite fist with grip and prehension strength to be  able to use right dominant hand and forearm activities as well as ADLs and IADLs.  Patient do live about 45 minutes away so we will recommend at this time 1 time a week.  OT do feel strongly patient can progress well with the right home program and upgrades weekly.    OT Occupational Profile and History Problem Focused Assessment - Including review of records relating to presenting problem    Occupational performance deficits (Please refer to evaluation for details): ADL's;IADL's;Work;Play;Leisure;Social Participation    Body Structure / Function / Physical Skills ADL;Strength;Dexterity;Pain;Edema;UE functional use;IADL;ROM;Scar mobility;Flexibility    Rehab Potential Good    Clinical Decision Making Limited treatment options, no task modification necessary    Comorbidities Affecting Occupational Performance: None    Modification or Assistance to Complete Evaluation  No modification of tasks or assist necessary to complete eval    OT Frequency 1x / week    OT Duration 6 weeks    OT Treatment/Interventions Self-care/ADL training;Fluidtherapy;Moist Heat;Contrast Bath;Paraffin;Manual Therapy;Patient/family education;Passive range of motion;Scar mobilization;Therapeutic exercise;Splinting;DME and/or AE instruction    Consulted and Agree with Plan of Care Patient             Patient will benefit from skilled therapeutic intervention in order to improve the following deficits and impairments:   Body Structure / Function / Physical Skills: ADL, Strength, Dexterity, Pain, Edema, UE functional use, IADL, ROM, Scar mobility, Flexibility       Visit Diagnosis: Stiffness of right hand, not elsewhere classified - Plan: Ot plan of care cert/re-cert  Stiffness of right wrist, not elsewhere classified - Plan: Ot plan of care cert/re-cert  Muscle weakness (generalized) - Plan: Ot plan of care cert/re-cert  Pain in right hand - Plan: Ot plan of care cert/re-cert    Problem List Patient Active  Problem List   Diagnosis Date Noted   Closed right trimalleolar fracture, initial encounter 01/17/2019    Rosalyn Gess, OTR/L,CLT 05/16/2022, 9:53 PM  [place] [facility] [address], [place], [address] Phone: [phone]   Fax:  [phone]  Name: Alice Taylor MRN: [id number] Date of Birth: 03-15-1961

## 2022-05-22 ENCOUNTER — Ambulatory Visit: Payer: PRIVATE HEALTH INSURANCE | Admitting: Occupational Therapy

## 2022-05-22 DIAGNOSIS — M25641 Stiffness of right hand, not elsewhere classified: Secondary | ICD-10-CM

## 2022-05-22 DIAGNOSIS — M25631 Stiffness of right wrist, not elsewhere classified: Secondary | ICD-10-CM

## 2022-05-22 DIAGNOSIS — M6281 Muscle weakness (generalized): Secondary | ICD-10-CM

## 2022-05-22 DIAGNOSIS — M79641 Pain in right hand: Secondary | ICD-10-CM

## 2022-05-22 NOTE — Therapy (Signed)
[place] [facility] [address] [place], [address] Phone: [phone]   Fax:  [phone]  Occupational Therapy Treatment  Patient Details  Name: Alice Taylor MRN: [id number] Date of Birth: [birth year] Referring Provider (OT): [place] PA   Encounter Date: 05/22/2022   OT End of Session - 05/22/22 1410     Visit Number 2    Number of Visits 6    Date for OT Re-Evaluation 06/27/22    OT Start Time 1119    OT Stop Time 1211    OT Time Calculation (min) 52 min    Activity Tolerance Patient tolerated treatment well    Behavior During Therapy [facility] for tasks assessed/performed             Past Medical History:  Diagnosis Date   Cancer ([place]) 1970   Skin   GERD (gastroesophageal reflux disease)    History of kidney stones    PONV (postoperative nausea and vomiting)    x1 after orif ankle surgery    Past Surgical History:  Procedure Laterality Date   APPENDECTOMY     KNEE ARTHROSCOPY Right    KNEE ARTHROSCOPY Left 04/24/2018   Procedure: ARTHROSCOPY KNEE, PARTIAL MEDIAL MENISCECTOMY;  Surgeon: Dereck Leep, MD;  Location: [facility] ORS;  Service: Orthopedics;  Laterality: Left;   ORIF ANKLE FRACTURE Right 01/17/2019   Procedure: OPEN REDUCTION INTERNAL FIXATION (ORIF) ANKLE FRACTURE;  Surgeon: Hessie Knows, MD;  Location: [facility] ORS;  Service: Orthopedics;  Laterality: Right;   ORIF WRIST FRACTURE Right 02/21/2022   Procedure: OPEN REDUCTION INTERNAL FIXATION (ORIF) OR RIGHT DISTAL RADIUS FRACTURE.;  Surgeon: Corky Mull, MD;  Location: [facility] ORS;  Service: Orthopedics;  Laterality: Right;    There were no vitals filed for this visit.   Subjective Assessment - 05/22/22 1407     Subjective  I am doing better.  I do not have really pain just more like a stiff tightness feeling..  My swelling is better.  I am little achy at the end of the day after using it a lot.  I did see Dr. Roland Rack since I seen you - I told him I  which I started seeing you earlier    Pertinent History Pt last seen by PA at Ortho on 04/03/22 - EDITHE DOBBIN is a 61 y.o. female who presents for follow-up now 6 weeks status post an open reduction and internal fixation of her right distal radius fracture with an ulnar styloid fracture. Overall, the patient feels that she is doing well. She still notes some discomfort in her wrist which she rates at 4/10 on today's visit, and for which she has been taking Tylenol and applying ice as necessary with temporary partial relief of her symptoms. She been wearing her Velcro wrist immobilizer on a regular basis, removing it for bathing purposes and for exercises. She still notes moderate stiffness in the wrist and fingers, as well as some swelling, but denies any reinjury to the hand or wrist. 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  As well as painting and being able to decorate at different events.    Currently in Pain? Yes    Pain Score 1     Pain Orientation Right    Pain Descriptors / Indicators Tightness    Pain Type Surgical pain    Pain Onset More than a month ago    Pain Frequency Intermittent                [facility] OT Assessment - 05/22/22 0001       AROM   Right Forearm Supination 65 Degrees    Right Wrist Extension 43 Degrees    Right Wrist Flexion 60 Degrees    Right Wrist Radial Deviation 23 Degrees    Right Wrist Ulnar Deviation 14 Degrees      Strength   Right Hand Grip (lbs) 21    Right Hand Lateral Pinch 7 lbs    Right Hand 3 Point Pinch 7 lbs    Left Hand Grip (lbs)  63    Left Hand Lateral Pinch 14 lbs    Left Hand 3 Point Pinch 13 lbs      Right Hand AROM   R Index  MCP 0-90 85 Degrees    R Index PIP 0-100 76 Degrees    R Long  MCP 0-90 85 Degrees    R Long PIP 0-100 88 Degrees    R Ring  MCP 0-90 85 Degrees    R Ring PIP 0-100 95 Degrees    R Little  MCP 0-90 80 Degrees    R Little PIP 0-100 90 Degrees           Pt made great progress in ROM at wrist , forearm and digits  Cont to be limited in composite fist and thumb flexion As well as wrist extention more than flexion Supination improve but still decrease            Done soft tissue massage to right hand prior to range of motion.  Metacarpal spreads, webspace as well as [place] tool #2 for sweeping over volar wrist and forearm as well as brushing in palm and volar digits prior to range of motion. Husband to assist patient with metacarpal spreads as well as at rolling with fingers and palm over read roller prior to range of motion.       Recommended for patient to do contrast prior to her home exercises to 3 times a day .   review with pt again AAROM  for right wrist flexion /extension as well as radial ulnar deviation over the edge of table 10 reps prior to using 1 pound weight in all planes 10-12 reps pain-free. Needed min A for correct stretch to feel  Done after doing CPM for wrist extention - 2 x 200 sec   Also educated in passive range of motion for supination using heating pad for 3 x 30 seconds stretch followed by 1 pound weight for supination and pronation. Patient to address thumb palmar radial abduction 10 reps each As well as reviewed with patient tendon glides with gentle active assisted range of motion for fifth MC during lumbrical fist 10 reps followed by intrinsic a fist blocked with great results and then composite fist touching  highlighter out of palm 12 reps - cont to use finger or high lighter pain free - prior to touching palm    All home exercises should  be  a slight pull or stretch less than a 2/10 pain. Patient can do 3 sets of 1 pound for wrist and forearm in all planes 12 reps.        OT Education - 05/22/22 1410     Education Details progress and changes to HEP    Person(s) Educated Patient    Methods Explanation;Demonstration;Tactile cues;Verbal cues;Handout    Comprehension Verbal cues required;Returned demonstration;Verbalized understanding              OT Short Term Goals - 05/16/22 2146       OT SHORT TERM GOAL #1   Title Patient to be independent in home program for active assisted range of motion for wrist and forearm in all planes to show progress to be able to upgrade patient to 2 pound weight.    Baseline Patient show flexion 48, extension of wrist 37, ulnar deviation 7 and radial deviation 18 with supination at 45.  Just started per patient about a week ago 1 pound weight.    Time 3    Period Weeks    Status New    Target Date 06/06/22               OT Long Term Goals - 05/16/22 2148       OT LONG TERM GOAL #1   Title Right wrist and forearm active range of motion increased to within functional limits for patient to turn a doorknob as well as push and pull heavy door with no increase symptoms.    Baseline Supination 45, extension 37, flexion 48, ulnar deviation 7 and radial deviation 18 with some discomfort and pain at end range.  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    Baseline Not tested at this time patient came for evaluation today we will assess next time    Time 6    Period Weeks    Status New    Target Date 06/27/22                   Plan - 05/22/22 1411     Clinical Impression Statement Patient presented OT evaluation with a referral from orthopedics for possible splinting for stiffness of digits.  Patient has a diagnosis of postop Colles' fracture R distal radius  ORIF 12 wks ago - on 02/21/22.  Patient was receiving 3 times a week 4 a month PT in [place].  Patient presented OT evaluation with severe stiffness in wrist flexion /extension as well as ulnar deviation and supination.  Increased stiffness and intrinsic a fist resulting in limited and impaired composite fist.  At this time would not recommend any  splinting for digits.  In session OT educated patient on active assisted range of motion and passive range of motion for flexion /extension as well as ulnar radial deviation with great results and progress as well as supination.  Followed by 1 pound weight in all planes.  Patient showed great progress coming in today after doing her HEP for about week - decrease edema, and pain  increase digits flexion and wrist flexion/ sup and UD/RD - extention still imparied , thumb composite fist and composite fist. Add and done some soft tissue mobs to volar wrist and forearm - digits and MC spreads prior to ROM - family to assist with that prior to her ROM. Pt to cont with contrast for swelling and pain and fitted with a Isotoner glove that she can try and use at nighttime since last time.  Would recommend patient strongly to cont 1 x wk with mef progressing well to address stiffness in wrist and forearm in all planes as well as upgrading strength as needed and addressing composite fist with grip and prehension strength to be able to use right dominant hand and forearm  activities as well as ADLs and IADLs.  Patient do live about 45 minutes away so we will recommend at this time 1 time a week.  OT do feel strongly patient can progress well with the right home program and upgrades weekly.    OT Occupational Profile and History Problem Focused Assessment - Including review of records relating to presenting problem    Occupational performance deficits (Please refer to evaluation for details): ADL's;IADL's;Work;Play;Leisure;Social Participation    Body Structure / Function / Physical Skills ADL;Strength;Dexterity;Pain;Edema;UE functional use;IADL;ROM;Scar mobility;Flexibility    Rehab Potential Good    Clinical Decision Making Limited treatment options, no task modification necessary    Comorbidities Affecting Occupational Performance: None    Modification or Assistance to Complete Evaluation  No modification of tasks or assist necessary to complete eval    OT Frequency 1x / week    OT Duration 6 weeks    OT Treatment/Interventions Self-care/ADL training;Fluidtherapy;Moist Heat;Contrast Bath;Paraffin;Manual Therapy;Patient/family education;Passive range of motion;Scar mobilization;Therapeutic exercise;Splinting;DME and/or AE instruction    Consulted and Agree with Plan of Care Patient             Patient will benefit from skilled therapeutic intervention in order to improve the following deficits and impairments:   Body Structure / Function / Physical Skills: ADL, Strength, Dexterity, Pain, Edema, UE functional use, IADL, ROM, Scar mobility, Flexibility       Visit Diagnosis: Stiffness of right hand, not elsewhere classified  Stiffness of right wrist, not elsewhere classified  Muscle weakness (generalized)  Pain in right hand    Problem List Patient Active Problem List   Diagnosis Date Noted   Closed right trimalleolar fracture, initial encounter 01/17/2019    Rosalyn Gess, OTR/L,CLT 05/22/2022, 2:17 PM  [facility] [facility] [address] [place], [address] Phone: [phone]   Fax:  [phone]  Name: Alice Taylor MRN: [id number] Date of Birth: 1961-07-04

## 2022-05-29 ENCOUNTER — Ambulatory Visit: Payer: PRIVATE HEALTH INSURANCE | Admitting: Occupational Therapy

## 2022-05-29 DIAGNOSIS — M25631 Stiffness of right wrist, not elsewhere classified: Secondary | ICD-10-CM

## 2022-05-29 DIAGNOSIS — M79641 Pain in right hand: Secondary | ICD-10-CM

## 2022-05-29 DIAGNOSIS — M25641 Stiffness of right hand, not elsewhere classified: Secondary | ICD-10-CM

## 2022-05-29 DIAGNOSIS — M6281 Muscle weakness (generalized): Secondary | ICD-10-CM

## 2022-05-29 NOTE — Therapy (Signed)
[facility] [address] [place], [address] Phone: [phone]   Fax:  [phone]  Occupational Therapy Treatment  Patient Details  Name: Alice Taylor MRN: [id number] Date of Birth: [birth year] Referring Provider (OT): [place] PA   Encounter Date: 05/29/2022   OT End of Session - 05/29/22 1416     Visit Number 3    Number of Visits 6    Date for OT Re-Evaluation 06/27/22    OT Start Time 1128    OT Stop Time 1230    OT Time Calculation (min) 62 min    Activity Tolerance Patient tolerated treatment well    Behavior During Therapy [facility] for tasks assessed/performed             Past Medical History:  Diagnosis Date   Cancer ([place]) 1970   Skin   GERD (gastroesophageal reflux disease)    History of kidney stones    PONV (postoperative nausea and vomiting)    x1 after orif ankle surgery    Past Surgical History:  Procedure Laterality Date   APPENDECTOMY     KNEE ARTHROSCOPY Right    KNEE ARTHROSCOPY Left 04/24/2018   Procedure: ARTHROSCOPY KNEE, PARTIAL MEDIAL MENISCECTOMY;  Surgeon: Dereck Leep, MD;  Location: [facility] ORS;  Service: Orthopedics;  Laterality: Left;   ORIF ANKLE FRACTURE Right 01/17/2019   Procedure: OPEN REDUCTION INTERNAL FIXATION (ORIF) ANKLE FRACTURE;  Surgeon: Hessie Knows, MD;  Location: [facility] ORS;  Service: Orthopedics;  Laterality: Right;   ORIF WRIST FRACTURE Right 02/21/2022   Procedure: OPEN REDUCTION INTERNAL FIXATION (ORIF) OR RIGHT DISTAL RADIUS FRACTURE.;  Surgeon: Corky Mull, MD;  Location: [facility] ORS;  Service: Orthopedics;  Laterality: Right;    There were no vitals filed for this visit.   Subjective Assessment - 05/29/22 1413     Subjective  No pain more tightness stiffness trying to make a fist.  Swelling is better.  Can make a fist better.  One of my horses passed away so I was using my hand a lot.    Pertinent History Pt last seen by PA at Ortho on 04/03/22 - Alice Taylor is a 61 y.o. female who presents for follow-up now 6 weeks status post an open reduction and internal fixation of her right distal radius fracture with an ulnar styloid fracture. Overall, the patient feels that she is doing well. She still notes some discomfort in her wrist which she rates at 4/10 on today's visit, and for which she has been taking Tylenol and applying ice as necessary with temporary partial relief of her symptoms. She been wearing her Velcro wrist immobilizer on a regular basis, removing it for bathing purposes and for exercises. She still notes moderate stiffness in the wrist and fingers, as well as some swelling, but denies any reinjury to the hand or wrist. 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  As well as painting and being able to decorate at different events.  Currently in Pain? Yes    Pain Score 1     Pain Location Hand    Pain Orientation Right    Pain Descriptors / Indicators Tightness    Pain Type Surgical pain    Pain Onset More than a month ago    Pain Frequency Intermittent                [facility] OT Assessment - 05/29/22 0001       AROM   Right Forearm Supination 75 Degrees    Right Wrist Extension 56 Degrees    Right Wrist Flexion 65 Degrees    Right Wrist Radial Deviation 25 Degrees    Right Wrist Ulnar Deviation 20 Degrees      Strength   Right Hand Grip (lbs) 18    Right Hand Lateral Pinch 8 lbs    Right Hand 3 Point Pinch 8 lbs    Left Hand Grip (lbs) 63    Left Hand Lateral Pinch 14 lbs    Left Hand 3 Point Pinch 13 lbs       Right Hand AROM   R Index  MCP 0-90 85 Degrees    R Index PIP 0-100 80 Degrees    R Long  MCP 0-90 85 Degrees    R Long PIP 0-100 85 Degrees    R Ring  MCP 0-90 90 Degrees    R Ring PIP 0-100 95 Degrees    R Little  MCP 0-90 85 Degrees    R Little PIP 0-100 90 Degrees              Pt continues to make progress in ROM at wrist , forearm and digits  Cont to be limited in composite fist and thumb flexion In endrange wrist extension, flexion and supination. Grip decreased compared to last time but had to use it a lot because of her horse died.         OT Treatments/Exercises (OP) - 05/29/22 0001       RUE Paraffin   Number Minutes Paraffin 8 Minutes    RUE Paraffin Location Wrist   hand   Comments Supination and wrist extension flexion stretches                Patient can use paraffin at home in the mornings but would still recommend contrast in afternoon and evening               Done soft tissue massage to right hand prior to range of motion.  Metacarpal spreads, webspace as well as Grasston tool #2 for sweeping over volar wrist and forearm as well as brushing in palm and volar digits prior to range of motion. Husband to assist patient with metacarpal spreads as well as at rolling with fingers and palm over read roller prior to range of motion.            cont with AAROM  for right wrist flexion /extension as well as radial ulnar deviation over the edge of table 10 reps  Upgrade to 2 lbs for wrist RD, UD and sup /pro - support into sup for stretch 12 reps  But keep at 1 lbs for wrist flexion- extention -still limit in motion Done after doing CPM for wrist extention/flexion - 2 x 200 sec - great progress   Also educated in passive range of motion for supination using heating pad for 3 x 30 seconds stretch followed by 1 pound weight for supination and  pronation. Patient to address thumb palmar radial abduction 10 reps each As well as reviewed with patient  tendon glides with gentle active assisted range of motion for fifth MC during lumbrical fist 10 reps followed by intrinsic a fist blocked with great results and then composite fist touching  highlighter out of palm 12 reps - cont to use finger or high lighter pain free - prior to touching palm  Decrease tenderness over A1pulleys of digits      All home exercises should be a slight pull or stretch less than a 2/10 pain. Patient can do 3 sets of 1 pound for wrist flexion/ ext 12 reps. But 1 set for sup/pro and RD, UD - increase pain free 2nd set in 3 days and then 5 days - 3rd set         teal med putty for lat and 3 point pinch  Needed mod A for 3 point - for correct techniques and mechanism 12 reps  Increase 2nd and 3rd set this week pain free  Hod off on grip         OT Education - 05/29/22 1416     Education Details progress and changes to HEP    Person(s) Educated Patient    Methods Explanation;Demonstration;Tactile cues;Verbal cues;Handout    Comprehension Verbal cues required;Returned demonstration;Verbalized understanding              OT Short Term Goals - 05/16/22 2146       OT SHORT TERM GOAL #1   Title Patient to be independent in home program for active assisted range of motion for wrist and forearm in all planes to show progress to be able to upgrade patient to 2 pound weight.    Baseline Patient show flexion 48, extension of wrist 37, ulnar deviation 7 and radial deviation 18 with supination at 45.  Just started per patient about a week ago 1 pound weight.    Time 3    Period Weeks    Status New    Target Date 06/06/22               OT Long Term Goals - 05/16/22 2148       OT LONG TERM GOAL #1   Title Right wrist and forearm active range of motion increased to within functional limits for patient to turn a doorknob as well as push and pull heavy door with no increase symptoms.    Baseline Supination 45, extension 37, flexion 48, ulnar deviation 7  and radial deviation 18 with some discomfort and pain at end range.  Patient can only do 1 pound weight that started about a week ago per patient    Time 6    Period Weeks    Status New    Target Date 06/27/22      OT LONG TERM GOAL #2   Title Right composite fist improved for patient to be able to touch palm and use tools and grip steering wheel without increase symptoms    Baseline MC flexions 75-80 degrees, PIP flexion 65-85 degrees    Time 4    Period Weeks    Status New    Target Date 06/13/22      OT LONG TERM GOAL #3   Title Right grip and prehension strength improved to more than 75% compared to left hand to be able to carry more than 10 pounds as well 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    Baseline  Not tested at this time patient came for evaluation today we will assess next time    Time 6    Period Weeks    Status New    Target Date 06/27/22                   Plan - 05/29/22 1417     Clinical Impression Statement Patient presented OT evaluation with a referral from orthopedics for possible splinting for stiffness of digits.  Patient has a diagnosis of postop Colles' fracture R distal radius  ORIF  on 02/21/22. Marland Kitchen  Patient presented OT evaluation 12 weeks postop with severe stiffness in wrist flexion /extension as well as ulnar deviation and supination.  Increased stiffness and intrinsic a fist resulting in limited and impaired composite fist.  Patient seen this date for second follow-up appointment with OT.  Patient making gradual progress in active range of motion for right digits as well as the wrist and forearm.  Mostly stiffness more than pain.  Able to upgrade patient to 2 pound weight for supination pronation as well as ulnar radial deviation.  Keep patient on 1 pound for wrist flexion extension.  Done this date again CPM for wrist flexion extension with great progress.  Was able to do paraffin this date prior to soft tissue mobs to volar wrist and  forearm - digits and MC spreads prior to ROM - family to assist with that prior to her ROM.  Add medium teal putty for three-point and lateral pinch-tolerating well.  Pt to cont with contrast in the evening for swelling and pain and continues with a Isotoner glove at nighttime.  Would recommend patient strongly to cont 1 x wk with mef progressing well to address stiffness in wrist and forearm in all planes as well as upgrading strength as needed and addressing composite fist with grip and prehension strength to be able to use right dominant hand and forearm activities as well as ADLs and IADLs.  Patient do live about 45 minutes away so we will recommend at this time 1 time a week.  OT do feel strongly patient can progress well with the right home program and upgrades weekly.    OT Occupational Profile and History Problem Focused Assessment - Including review of records relating to presenting problem    Occupational performance deficits (Please refer to evaluation for details): ADL's;IADL's;Work;Play;Leisure;Social Participation    Body Structure / Function / Physical Skills ADL;Strength;Dexterity;Pain;Edema;UE functional use;IADL;ROM;Scar mobility;Flexibility    Rehab Potential Good    Clinical Decision Making Limited treatment options, no task modification necessary    Comorbidities Affecting Occupational Performance: None    Modification or Assistance to Complete Evaluation  No modification of tasks or assist necessary to complete eval    OT Frequency 1x / week    OT Duration 6 weeks    OT Treatment/Interventions Self-care/ADL training;Fluidtherapy;Moist Heat;Contrast Bath;Paraffin;Manual Therapy;Patient/family education;Passive range of motion;Scar mobilization;Therapeutic exercise;Splinting;DME and/or AE instruction    Consulted and Agree with Plan of Care Patient             Patient will benefit from skilled therapeutic intervention in order to improve the following deficits and impairments:    Body Structure / Function / Physical Skills: ADL, Strength, Dexterity, Pain, Edema, UE functional use, IADL, ROM, Scar mobility, Flexibility       Visit Diagnosis: Stiffness of right hand, not elsewhere classified  Stiffness of right wrist, not elsewhere classified  Muscle weakness (generalized)  Pain in right hand  Problem List Patient Active Problem List   Diagnosis Date Noted   Closed right trimalleolar fracture, initial encounter 01/17/2019    Rosalyn Gess, OTR/L,CLT 05/29/2022, 2:23 PM  [facility] [address], [place], [address] Phone: [phone]   Fax:  [phone]  Name: Ciani Rutten MRN: [id number] Date of Birth: 09/15/1960

## 2022-05-30 ENCOUNTER — Encounter: Payer: PRIVATE HEALTH INSURANCE | Admitting: Occupational Therapy

## 2022-06-06 ENCOUNTER — Ambulatory Visit: Payer: PRIVATE HEALTH INSURANCE | Admitting: Occupational Therapy

## 2022-06-06 DIAGNOSIS — M25641 Stiffness of right hand, not elsewhere classified: Secondary | ICD-10-CM

## 2022-06-06 DIAGNOSIS — M79641 Pain in right hand: Secondary | ICD-10-CM

## 2022-06-06 DIAGNOSIS — M25631 Stiffness of right wrist, not elsewhere classified: Secondary | ICD-10-CM

## 2022-06-06 DIAGNOSIS — M6281 Muscle weakness (generalized): Secondary | ICD-10-CM

## 2022-06-06 NOTE — Therapy (Signed)
[facility] [address] [place], [address] Phone: [phone]   Fax:  [phone]  Occupational Therapy Treatment  Patient Details  Name: Alice Taylor MRN: [id number] Date of Birth: 1961/05/20 Referring Provider (OT): [place] PA   Encounter Date: 06/06/2022   OT End of Session - 06/06/22 1223     Visit Number 4    Number of Visits 6    Date for OT Re-Evaluation 06/27/22    OT Start Time 1030    OT Stop Time 1118    OT Time Calculation (min) 48 min    Activity Tolerance Patient tolerated treatment well    Behavior During Therapy [facility] for tasks assessed/performed             Past Medical History:  Diagnosis Date   Cancer ([place]) 1970   Skin   GERD (gastroesophageal reflux disease)    History of kidney stones    PONV (postoperative nausea and vomiting)    x1 after orif ankle surgery    Past Surgical History:  Procedure Laterality Date   APPENDECTOMY     KNEE ARTHROSCOPY Right    KNEE ARTHROSCOPY Left 04/24/2018   Procedure: ARTHROSCOPY KNEE, PARTIAL MEDIAL MENISCECTOMY;  Surgeon: Dereck Leep, MD;  Location: [facility] ORS;  Service: Orthopedics;  Laterality: Left;   ORIF ANKLE FRACTURE Right 01/17/2019   Procedure: OPEN REDUCTION INTERNAL FIXATION (ORIF) ANKLE FRACTURE;  Surgeon: Hessie Knows, MD;  Location: [facility] ORS;  Service: Orthopedics;  Laterality: Right;   ORIF WRIST FRACTURE Right 02/21/2022   Procedure: OPEN REDUCTION INTERNAL FIXATION (ORIF) OR RIGHT DISTAL RADIUS FRACTURE.;  Surgeon: Corky Mull, MD;  Location: [facility] ORS;  Service: Orthopedics;  Laterality: Right;    There were no vitals filed for this visit.   Subjective Assessment - 06/06/22 1034     Subjective  Use it a ton - had to help with birth of calve- sore for day or 2 -now more stiff than pain    Pertinent History Pt last seen by PA at Ortho on 04/03/22 - Alice Taylor is a 61 y.o. female who presents for follow-up now 6 weeks  status post an open reduction and internal fixation of her right distal radius fracture with an ulnar styloid fracture. Overall, the patient feels that she is doing well. She still notes some discomfort in her wrist which she rates at 4/10 on today's visit, and for which she has been taking Tylenol and applying ice as necessary with temporary partial relief of her symptoms. She been wearing her Velcro wrist immobilizer on a regular basis, removing it for bathing purposes and for exercises. She still notes moderate stiffness in the wrist and fingers, as well as some swelling, but denies any reinjury to the hand or wrist. 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  As well as painting and being able to decorate at different events.    Currently in Pain? No/denies  [facility] OT Assessment - 06/06/22 0001       AROM   Right Forearm Supination 80 Degrees    Right Wrist Extension 60 Degrees    Right Wrist Flexion 70 Degrees    Right Wrist Radial Deviation 28 Degrees    Right Wrist Ulnar Deviation 23 Degrees      Strength   Right Hand Grip (lbs) 24    Right Hand Lateral Pinch 11 lbs    Right Hand 3 Point Pinch 10 lbs    Left Hand Grip (lbs) 63    Left Hand Lateral Pinch 14 lbs    Left Hand 3 Point Pinch 13 lbs      Right Hand AROM   R Index  MCP 0-90 85 Degrees    R Index PIP 0-100 80 Degrees    R Long  MCP 0-90 90 Degrees    R Long PIP 0-100 90 Degrees    R Ring  MCP 0-90 90 Degrees    R Ring PIP 0-100 100 Degrees    R Little  MCP 0-90 90 Degrees    R Little PIP 0-100  95 Degrees               Pt show increase grip and prehension strength As well as increase AROM for digits and wrist - see flow sheet        OT Treatments/Exercises (OP) - 06/06/22 0001       RUE Paraffin   Number Minutes Paraffin 8 Minutes    RUE Paraffin Location Wrist   hand   Comments wrist flexion, ext stretch using heatingpad               Patient can cont paraffin at home in the mornings but cont recommend contrast in afternoon and evening               Done soft tissue massage to right hand prior to range of motion.  Metacarpal spreads, webspace as well as Grasston tool #2 for sweeping over volar wrist and forearm as well as brushing in palm and volar digits prior to range of motion. Done some scar mobs to volar and distal scar and used xtractore 5 x with wrist extention and flexion  Husband to assist patient with metacarpal spreads as well as at rolling with fingers and palm over read roller prior to range of motion.            cont with AAROM  for right wrist flexion /extension as well as radial ulnar deviation over the edge of table 10 reps   2 lbs for wrist RD, UD and sup /pro - support into sup for stretch 12 reps  3 sets  But keep at 1 lbs for wrist flexion- extention -still limit in motion 3  sets    Also educated  focusing on place and hold for end range for  supination after doing supination using heating pad for 3 x 30 seconds stretch followed by 1 pound weight for supination and pronation. Patient to address thumb palmar radial abduction 10 reps each As well as reviewed with patient tendon glides with gentle active assisted range of motion for fifth MC during lumbrical fist 10 reps followed by intrinsic a fist blocked with great results and then composite fist touching  highlighter out of palm 12 reps - cont to use finger or high lighter pain free - prior to touching palm  Decrease tenderness over A1pulleys of digits  Add table slide  for wrist extention 20 reps - slight pull     Done BTE for wrist flexion 20 reps 120 sec And sup  end range  - on BTE - 3 lbs - 120 sec     teal med putty  3 point and lat grip  12 reps - 2 x day - 3 sets             OT Education - 06/06/22 1223     Education Details progress and changes to HEP    Person(s) Educated Patient    Methods Explanation;Demonstration;Tactile cues;Verbal cues;Handout    Comprehension Verbal cues required;Returned demonstration;Verbalized understanding              OT Short Term Goals - 05/16/22 2146       OT SHORT TERM GOAL #1   Title Patient to be independent in home program for active assisted range of motion for wrist and forearm in all planes to show progress to be able to upgrade patient to 2 pound weight.    Baseline Patient show flexion 48, extension of wrist 37, ulnar deviation 7 and radial deviation 18 with supination at 45.  Just started per patient about a week ago 1 pound weight.    Time 3    Period Weeks    Status New    Target Date 06/06/22               OT Long Term Goals - 05/16/22 2148       OT LONG TERM GOAL #1   Title Right wrist and forearm active range of motion increased to within functional limits for patient to turn a doorknob as well as push and pull heavy door with no increase symptoms.    Baseline Supination 45, extension 37, flexion 48, ulnar deviation 7 and radial deviation 18 with some discomfort and pain at end range.  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    Baseline Not tested at this time patient came for evaluation today we will assess next time    Time 6    Period Weeks    Status New    Target Date 06/27/22                   Plan - 06/06/22 1224     Clinical Impression Statement Patient presented OT evaluation with a referral from orthopedics for possible splinting for stiffness of digits.  Patient has a diagnosis of postop Colles' fracture R distal radius  ORIF  on 02/21/22. Marland Kitchen  Patient presented OT evaluation 12 weeks postop with severe stiffness in wrist flexion /extension as well  as ulnar deviation and supination.  Increased stiffness and intrinsic a fist resulting in limited and impaired composite fist. .  Patient making  great progress in AROM for right digits as well as the wrist and forearm. As well as grip /prehension strength.   Mostly stiffness more than pain.  Pt to  cont with  2 pound weight for supination pronation as well as ulnar radial deviation.  Keep patient on 1 pound for wrist flexion/ extension.  Add table slides for wrist extention.  Was able to do paraffin this date prior to soft tissue mobs to volar wrist and forearm - digits and MC spreads prior to ROM - family to assist with that prior to her ROM.  Cont with medium teal putty for three-point and lateral pinch-tolerating well.  Pt to cont with contrast in the evening for swelling and pain and continues with a Isotoner glove at nighttime.  Would recommend patient strongly to cont 1 x wk with mef progressing well to address stiffness in wrist and forearm in all planes as well as upgrading strength as needed and addressing composite fist with grip and prehension strength to be able to use right dominant hand and forearm activities as well as ADLs and IADLs.  Patient do live about 45 minutes away so we  will recommend at this time 1 time a week.  OT do feel strongly patient can progress well with the right home program and upgrades weekly.    OT Occupational Profile and History Problem Focused Assessment - Including review of records relating to presenting problem    Occupational performance deficits (Please refer to evaluation for details): ADL's;IADL's;Work;Play;Leisure;Social Participation    Body Structure / Function / Physical Skills ADL;Strength;Dexterity;Pain;Edema;UE functional use;IADL;ROM;Scar mobility;Flexibility    Rehab Potential Good    Clinical Decision Making Limited treatment options, no task modification necessary    Comorbidities Affecting Occupational Performance: None    Modification or Assistance to Complete Evaluation  No modification of tasks or assist necessary to complete eval    OT Frequency 1x / week    OT Duration 6 weeks    OT Treatment/Interventions Self-care/ADL training;Fluidtherapy;Moist Heat;Contrast Bath;Paraffin;Manual Therapy;Patient/family education;Passive range of motion;Scar mobilization;Therapeutic exercise;Splinting;DME and/or AE instruction    Consulted and Agree with Plan of Care Patient             Patient will benefit from skilled therapeutic intervention in order to improve the following deficits and impairments:   Body Structure / Function / Physical Skills: ADL, Strength, Dexterity, Pain, Edema, UE functional use, IADL, ROM, Scar mobility, Flexibility       Visit Diagnosis: Stiffness of right hand, not elsewhere classified  Stiffness of right wrist, not elsewhere classified  Muscle weakness (generalized)  Pain in right hand    Problem List Patient Active Problem List   Diagnosis Date Noted   Closed right trimalleolar fracture, initial encounter 01/17/2019    Rosalyn Gess, OTR/L,CLT 06/06/2022, 12:30 PM  [facility] [address], [place],  [address] Phone: [phone]   Fax:  [phone]  Name: Alice Taylor MRN: [id number] Date of Birth: 24-Dec-1960

## 2022-06-13 ENCOUNTER — Ambulatory Visit: Payer: PRIVATE HEALTH INSURANCE | Attending: Student | Admitting: Occupational Therapy

## 2022-06-13 DIAGNOSIS — M6281 Muscle weakness (generalized): Secondary | ICD-10-CM | POA: Diagnosis present

## 2022-06-13 DIAGNOSIS — M25631 Stiffness of right wrist, not elsewhere classified: Secondary | ICD-10-CM | POA: Insufficient documentation

## 2022-06-13 DIAGNOSIS — M25641 Stiffness of right hand, not elsewhere classified: Secondary | ICD-10-CM | POA: Insufficient documentation

## 2022-06-13 DIAGNOSIS — M79641 Pain in right hand: Secondary | ICD-10-CM | POA: Insufficient documentation

## 2022-06-13 NOTE — Therapy (Signed)
[facility] [address] [place], [address] Phone: [phone]   Fax:  [phone]  Occupational Therapy Treatment  Patient Details  Name: Alice Taylor MRN: [id number] Date of Birth: Feb 28, 1961 Referring Provider (OT): [place] PA   Encounter Date: 06/13/2022   OT End of Session - 06/13/22 1035     Visit Number 5    Number of Visits 6    Date for OT Re-Evaluation 06/27/22    OT Start Time 1036    OT Stop Time 1120    OT Time Calculation (min) 44 min    Activity Tolerance Patient tolerated treatment well    Behavior During Therapy [facility] for tasks assessed/performed             Past Medical History:  Diagnosis Date   Cancer ([place]) 1970   Skin   GERD (gastroesophageal reflux disease)    History of kidney stones    PONV (postoperative nausea and vomiting)    x1 after orif ankle surgery    Past Surgical History:  Procedure Laterality Date   APPENDECTOMY     KNEE ARTHROSCOPY Right    KNEE ARTHROSCOPY Left 04/24/2018   Procedure: ARTHROSCOPY KNEE, PARTIAL MEDIAL MENISCECTOMY;  Surgeon: Dereck Leep, MD;  Location: [facility] ORS;  Service: Orthopedics;  Laterality: Left;   ORIF ANKLE FRACTURE Right 01/17/2019   Procedure: OPEN REDUCTION INTERNAL FIXATION (ORIF) ANKLE FRACTURE;  Surgeon: Hessie Knows, MD;  Location: [facility] ORS;  Service: Orthopedics;  Laterality: Right;   ORIF WRIST FRACTURE Right 02/21/2022   Procedure: OPEN REDUCTION INTERNAL FIXATION (ORIF) OR RIGHT DISTAL RADIUS FRACTURE.;  Surgeon: Corky Mull, MD;  Location: [facility] ORS;  Service: Orthopedics;  Laterality: Right;    There were no vitals filed for this visit.   Subjective Assessment - 06/13/22 1035     Subjective  Can make better fist - close to touching palm - grasping things better - not feel like dropping things anymore but will not pick up gallon of milk yet- my pinkie still wants to bend at times    Pertinent History Pt last seen by  PA at Ortho on 04/03/22 - Alice Taylor is a 61 y.o. female who presents for follow-up now 6 weeks status post an open reduction and internal fixation of her right distal radius fracture with an ulnar styloid fracture. Overall, the patient feels that she is doing well. She still notes some discomfort in her wrist which she rates at 4/10 on today's visit, and for which she has been taking Tylenol and applying ice as necessary with temporary partial relief of her symptoms. She been wearing her Velcro wrist immobilizer on a regular basis, removing it for bathing purposes and for exercises. She still notes moderate stiffness in the wrist and fingers, as well as some swelling, but denies any reinjury to the hand or wrist. 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  As well as painting and being able to decorate at different  events.    Currently in Pain? No/denies                [facility] OT Assessment - 06/13/22 0001       AROM   Right Forearm Supination 85 Degrees    Right Wrist Extension 64 Degrees    Right Wrist Flexion 74 Degrees    Right Wrist Radial Deviation 28 Degrees    Right Wrist Ulnar Deviation 23 Degrees      Strength   Right Hand Grip (lbs) 26    Right Hand Lateral Pinch 11 lbs    Right Hand 3 Point Pinch 11 lbs                      OT Treatments/Exercises (OP) - 06/13/22 0001       RUE Paraffin   Number Minutes Paraffin 8 Minutes    RUE Paraffin Location Wrist   hand   Comments wrist flexion, ext stretch prior to PROM              Done soft tissue  massage to right hand prior to range of motion.  Metacarpal spreads, webspace as well as [place]asston tool #2 for sweeping over volar wrist and forearm as well as brushing in palm and volar digits prior to range of motion.           cont with AAROM  for right wrist flexion /extension as well as radial ulnar deviation over the edge of table 10 reps Done some prolonged wrist flexion, extention by OT  And supination  Cont   2 lbs for wrist RD, UD and sup /pro - support into sup for stretch 12 reps  3 sets  Increase to 2 lbs for wrist flexion, ext - 12 reps- increase gradually this week to 2nd and 3rd set painfree - can do place and hold if needed for wrist ext    Also educated  focusing on place and hold for end range for  supination after doing supination using heating pad for 3 x 30 seconds stretch followed by 1 pound weight for supination and pronation. Patient to address thumb palmar radial abduction 10 reps each Cont  tendon glides with gentle active assisted range of motion for fifth MC during lumbrical fist 10 reps followed by intrinsic a fist blocked with great results and then composite fist touching  highlighter out of palm 12 reps - cont to use finger or high lighter pain free - prior to touching palm  Decrease tenderness over A1pulleys of digits  Did add ulnar N glide 5 reps after  table slide for wrist extention 20 reps - slight pull - prior to weight too    upgrade to med firm green putty   Add grip and cont with 3 point and lat grip  12 reps - 2 x day - increase to 2nd and 3rd set if pain free over the next week or 10 days             OT Education - 06/13/22 1035     Education Details progress and changes to HEP    Person(s) Educated Patient    Methods Explanation;Demonstration;Tactile cues;Verbal cues;Handout    Comprehension Verbal cues required;Returned demonstration;Verbalized understanding              OT Short Term Goals - 05/16/22 2146       OT SHORT TERM  GOAL #1   Title Patient to be independent in home program  for active assisted range of motion for wrist and forearm in all planes to show progress to be able to upgrade patient to 2 pound weight.    Baseline Patient show flexion 48, extension of wrist 37, ulnar deviation 7 and radial deviation 18 with supination at 45.  Just started per patient about a week ago 1 pound weight.    Time 3    Period Weeks    Status New    Target Date 06/06/22               OT Long Term Goals - 05/16/22 2148       OT LONG TERM GOAL #1   Title Right wrist and forearm active range of motion increased to within functional limits for patient to turn a doorknob as well as push and pull heavy door with no increase symptoms.    Baseline Supination 45, extension 37, flexion 48, ulnar deviation 7 and radial deviation 18 with some discomfort and pain at end range.  Patient can only do 1 pound weight that started about a week ago per patient    Time 6    Period Weeks    Status New    Target Date 06/27/22      OT LONG TERM GOAL #2   Title Right composite fist improved for patient to be able to touch palm and use tools and grip steering wheel without increase symptoms    Baseline MC flexions 75-80 degrees, PIP flexion 65-85 degrees    Time 4    Period Weeks    Status New    Target Date 06/13/22      OT LONG TERM GOAL #3   Title Right grip and prehension strength improved to more than 75% compared to left hand to be able to carry more than 10 pounds as well 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    Baseline Not tested at this time patient came for evaluation today we will assess next time    Time 6    Period Weeks    Status New    Target Date 06/27/22                   Plan - 06/13/22 1036     Clinical Impression Statement Patient presented OT evaluation with a referral from orthopedics for possible splinting for stiffness of digits.  Patient has a diagnosis of postop Colles'  fracture R distal radius  ORIF  on 02/21/22. Marland Kitchen  Patient presented OT evaluation 12 weeks postop with severe stiffness in wrist flexion /extension as well as ulnar deviation and supination.  Increased stiffness and intrinsic a fist resulting in limited and impaired composite fist. .  Patient making  great progress in AROM for right digits as well as the wrist and forearm. And s grip /prehension strength.   Mostly stiffness more than pain.  Pt to  cont with  2 pound weight for supination pronation/ ulnar radial deviation. Did increase her to 2 lbs for  wrist flexion/ extension to be done after PROM and table slides for wrist extention. She cont to drop into some wrist flexion during composite grip. Pt to focus on keeping wrist neutral.  Cont Paraffin prior to composite fist - upgrade her to green med firm putty for gripping and  three-point and lateral pinch-tolerating well.  Pt to cont with contrast in the evening for swelling and pain and continues with a Isotoner glove at nighttime. Pt do appear  to have some cubital tunnel symptoms - 5th digit clawing , positive Tinel and tenderness - pt to not cradling her hand at chest. Ed on positioning of R UE . Pt to focus on stretches for end range wrist and composite digits flexion- prior to doing her 2 lbs weight and putty painfree.   Patient do live about 45 minutes away so will cont recommend 1 x wk.  OT do feel strongly patient can progress well with the right home program and upgrades.    OT Occupational Profile and History Problem Focused Assessment - Including review of records relating to presenting problem    Occupational performance deficits (Please refer to evaluation for details): ADL's;IADL's;Work;Play;Leisure;Social Participation    Body Structure / Function / Physical Skills ADL;Strength;Dexterity;Pain;Edema;UE functional use;IADL;ROM;Scar mobility;Flexibility    Rehab Potential Good    Clinical Decision Making Limited treatment options, no task  modification necessary    Comorbidities Affecting Occupational Performance: None    Modification or Assistance to Complete Evaluation  No modification of tasks or assist necessary to complete eval    OT Frequency 1x / week    OT Duration 6 weeks    OT Treatment/Interventions Self-care/ADL training;Fluidtherapy;Moist Heat;Contrast Bath;Paraffin;Manual Therapy;Patient/family education;Passive range of motion;Scar mobilization;Therapeutic exercise;Splinting;DME and/or AE instruction    Consulted and Agree with Plan of Care Patient             Patient will benefit from skilled therapeutic intervention in order to improve the following deficits and impairments:   Body Structure / Function / Physical Skills: ADL, Strength, Dexterity, Pain, Edema, UE functional use, IADL, ROM, Scar mobility, Flexibility       Visit Diagnosis: Stiffness of right hand, not elsewhere classified  Stiffness of right wrist, not elsewhere classified  Muscle weakness (generalized)  Pain in right hand    Problem List Patient Active Problem List   Diagnosis Date Noted   Closed right trimalleolar fracture, initial encounter 01/17/2019    Rosalyn Gess, OTR/L,CLT 06/13/2022, 12:01 PM  [facility] [address], [place], [address] Phone: [phone]   Fax:  [phone]  Name: Alice Taylor MRN: [id number] Date of Birth: Dec 09, 1960

## 2022-06-19 ENCOUNTER — Other Ambulatory Visit: Payer: Self-pay

## 2022-06-19 ENCOUNTER — Emergency Department: Payer: PRIVATE HEALTH INSURANCE

## 2022-06-19 ENCOUNTER — Emergency Department
Admission: EM | Admit: 2022-06-19 | Discharge: 2022-06-19 | Disposition: A | Discharge status: Home / Self Care | Payer: PRIVATE HEALTH INSURANCE | Attending: Student in an Organized Health Care Education/Training Program | Admitting: Student in an Organized Health Care Education/Training Program

## 2022-06-19 ENCOUNTER — Encounter: Payer: Self-pay | Admitting: Intensive Care

## 2022-06-19 DIAGNOSIS — S4991XA Unspecified injury of right shoulder and upper arm, initial encounter: Secondary | ICD-10-CM | POA: Diagnosis present

## 2022-06-19 DIAGNOSIS — W1839XA Other fall on same level, initial encounter: Secondary | ICD-10-CM | POA: Diagnosis not present

## 2022-06-19 DIAGNOSIS — S42201A Unspecified fracture of upper end of right humerus, initial encounter for closed fracture: Secondary | ICD-10-CM | POA: Insufficient documentation

## 2022-06-19 MED ORDER — OXYCODONE-ACETAMINOPHEN 5-325 MG PO TABS
1.0000 | ORAL_TABLET | Freq: Once | ORAL | Status: DC
  Filled 2022-06-19: qty 1

## 2022-06-19 MED ORDER — MORPHINE SULFATE (PF) 4 MG/ML IV SOLN
4.0000 mg | INTRAVENOUS | Status: DC | PRN
  Administered 2022-06-19: 4 mg via INTRAMUSCULAR
  Filled 2022-06-19: qty 1

## 2022-06-19 MED ORDER — OXYCODONE-ACETAMINOPHEN 5-325 MG PO TABS: 1.0000 | ORAL_TABLET | ORAL | 0 refills | Status: AC | PRN

## 2022-06-19 NOTE — ED Provider Notes (Signed)
[facility] Provider Note    Event Date/Time   First MD Initiated Contact with Patient 06/19/22 1731     (approximate)   History   Shoulder Pain (right)   HPI  Alice Taylor is a 61 y.o. female to ER for evaluation of right shoulder pain that occurred after mechanical fall.  She is working outside of fell did not hit her head denies any neck pain or chest pain.  States she was protecting her right hand because she just recently injured her right wrist and landed primarily on her shoulder.  Denies any numbness or tingling.     Physical Exam   Triage Vital Signs: ED Triage Vitals [06/19/22 1811]  Enc Vitals Group     BP (!) 168/96     Pulse Rate 93     Resp 16     Temp 98.2 F (36.8 C)     Temp Source Oral     SpO2 97 %     Weight 168 lb (76.2 kg)     Height '5\' 9"'$  (1.753 m)     Head Circumference      Peak Flow      Pain Score 10     Pain Loc      Pain Edu?      Excl. in [place]?     Most recent vital signs: Vitals:   06/19/22 1811  BP: (!) 168/96  Pulse: 93  Resp: 16  Temp: 98.2 F (36.8 C)  SpO2: 97%     Constitutional: Alert  Eyes: Conjunctivae are normal.  Head: Atraumatic. Nose: No congestion/rhinnorhea. Mouth/Throat: Mucous membranes are moist.   Neck: Painless ROM.  Cardiovascular:   Good peripheral circulation. Respiratory: Normal respiratory effort.  No retractions.  Gastrointestinal: Soft and nontender.  Musculoskeletal: There is a palpation some swelling in the right shoulder.  Nv intact distally Neurologic:  MAE spontaneously. No gross focal neurologic deficits are appreciated.  Skin:  Skin is warm, dry and intact. No rash noted. Psychiatric: Mood and affect are normal. Speech and behavior are normal.    ED Results / Procedures / Treatments   Labs (all labs ordered are listed, but only abnormal results are displayed) Labs Reviewed - No data to display   EKG     RADIOLOGY Please see ED Course for my review  and interpretation.  I personally reviewed all radiographic images ordered to evaluate for the above acute complaints and reviewed radiology reports and findings.  These findings were personally discussed with the patient.  Please see medical record for radiology report.    PROCEDURES:  Critical Care performed:   Procedures   MEDICATIONS ORDERED IN ED: Medications  morphine (PF) 4 MG/ML injection 4 mg (4 mg Intramuscular Given 06/19/22 1850)  oxyCODONE-acetaminophen (PERCOCET/ROXICET) 5-325 MG per tablet 1 tablet (has no administration in time range)     IMPRESSION / MDM / ASSESSMENT AND PLAN / ED COURSE  I reviewed the triage vital signs and the nursing notes.                              Differential diagnosis includes, but is not limited to, fracture, contusion, dislocation  Patient presented to the ER for evaluation of fracture and injury as described above.  X-ray on my review and interpretation does not show any evidence of dislocation.  Neurovascular intact.  Stable and appropriate for placement in sling.  Patient given IM morphine.  Discussed return precautions and follow-up with orthopedics.    FINAL CLINICAL IMPRESSION(S) / ED DIAGNOSES   Final diagnoses:  Closed fracture of proximal end of right humerus, unspecified fracture morphology, initial encounter     Rx / DC Orders   ED Discharge Orders          Ordered    oxyCODONE-acetaminophen (PERCOCET) 5-325 MG tablet  Every 4 hours PRN        06/19/22 1908             Note:  This document was prepared using Dragon voice recognition software and may include unintentional dictation errors.    Merlyn Lot, MD 06/19/22 1910

## 2022-06-19 NOTE — ED Triage Notes (Signed)
Patient presents with right shoulder pain from fall today. Reports mechanical fall.

## 2022-06-19 NOTE — ED Notes (Signed)
Pt stated she oxy makes her feel weird. She doesn't want that.

## 2022-06-19 NOTE — ED Provider Triage Note (Signed)
  Emergency Medicine Provider Triage Evaluation Note  Eunie Lawn , a 61 y.o.female,  was evaluated in triage.  Pt complains of right shoulder/elbow pain after mechanical fall earlier today.  She states that she lost balance and fell onto her right side.  She did not brace herself because she recently broke her right wrist and has it in a sling.  Denies any other injuries at this time.   Review of Systems  Positive: Right shoulder/elbow pain Negative: Denies fever, chest pain, vomiting  Physical Exam  There were no vitals filed for this visit. Gen:   Awake, no distress   Resp:  Normal effort  MSK:   Moves extremities without difficulty  Other:  Tenderness appreciated along the right shoulder joint and olecranon process.  Medical Decision Making  Given the patient's initial medical screening exam, the following diagnostic evaluation has been ordered. The patient will be placed in the appropriate treatment space, once one is available, to complete the evaluation and treatment. I have discussed the plan of care with the patient and I have advised the patient that an ED physician or mid-level practitioner will reevaluate their condition after the test results have been received, as the results may give them additional insight into the type of treatment they may need.    Diagnostics: Right shoulder x-ray, right elbow x-ray.  Treatments: none immediately   Teodoro Spray, [place] 06/19/22 1713

## 2022-06-27 ENCOUNTER — Ambulatory Visit: Payer: PRIVATE HEALTH INSURANCE | Admitting: Occupational Therapy

## 2022-06-27 DIAGNOSIS — M25641 Stiffness of right hand, not elsewhere classified: Secondary | ICD-10-CM | POA: Diagnosis not present

## 2022-06-27 DIAGNOSIS — M6281 Muscle weakness (generalized): Secondary | ICD-10-CM

## 2022-06-27 DIAGNOSIS — M79641 Pain in right hand: Secondary | ICD-10-CM

## 2022-06-27 DIAGNOSIS — M25631 Stiffness of right wrist, not elsewhere classified: Secondary | ICD-10-CM

## 2022-06-27 NOTE — Therapy (Signed)
[facility] [address] [place], [address] Phone: [phone]   Fax:  [phone]  Occupational Therapy Treatment  Patient Details  Name: Alice Taylor MRN: [id number] Date of Birth: 1961/07/22 Referring Provider (OT): [place] PA   Encounter Date: 06/27/2022   OT End of Session - 06/27/22 1145     Visit Number 6    Number of Visits 6    Date for OT Re-Evaluation 06/27/22    OT Start Time 1030    OT Stop Time 1130    OT Time Calculation (min) 60 min    Activity Tolerance Patient tolerated treatment well    Behavior During Therapy [name] for tasks assessed/performed             Past Medical History:  Diagnosis Date   Cancer ([place]) 1970   Skin   GERD (gastroesophageal reflux disease)    History of kidney stones    PONV (postoperative nausea and vomiting)    x1 after orif ankle surgery    Past Surgical History:  Procedure Laterality Date   APPENDECTOMY     KNEE ARTHROSCOPY Right    KNEE ARTHROSCOPY Left 04/24/2018   Procedure: ARTHROSCOPY KNEE, PARTIAL MEDIAL MENISCECTOMY;  Surgeon: Dereck Leep, MD;  Location: [facility] ORS;  Service: Orthopedics;  Laterality: Left;   ORIF ANKLE FRACTURE Right 01/17/2019   Procedure: OPEN REDUCTION INTERNAL FIXATION (ORIF) ANKLE FRACTURE;  Surgeon: Hessie Knows, MD;  Location: [facility] ORS;  Service: Orthopedics;  Laterality: Right;   ORIF WRIST FRACTURE Right 02/21/2022   Procedure: OPEN REDUCTION INTERNAL FIXATION (ORIF) OR RIGHT DISTAL RADIUS FRACTURE.;  Surgeon: Corky Mull, MD;  Location: [facility] ORS;  Service: Orthopedics;  Laterality: Right;    There were no vitals filed for this visit.   Subjective Assessment - 06/27/22 1139     Subjective  You would not believe but I fell on concrete early last week-I tried to tough my arm and not a full my wrist.  I was stepping over something on the forearm.  I have been in the sling -my fingers just feels stiff and my wrist.     Pertinent History Pt last seen by PA at Ortho on 04/03/22 - Alice Taylor is a 61 y.o. female who presents for follow-up now 6 weeks status post an open reduction and internal fixation of her right distal radius fracture with an ulnar styloid fracture. Overall, the patient feels that she is doing well. She still notes some discomfort in her wrist which she rates at 4/10 on today's visit, and for which she has been taking Tylenol and applying ice as necessary with temporary partial relief of her symptoms. She been wearing her Velcro wrist immobilizer on a regular basis, removing it for bathing purposes and for exercises. She still notes moderate stiffness in the wrist and fingers, as well as some swelling, but denies any reinjury to the hand or wrist. 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  As  well as painting and being able to decorate at different events.    Currently in Pain? No/denies                [facility] OT Assessment - 06/27/22 0001       AROM   Right Forearm Pronation 90 Degrees    Right Forearm Supination 45 Degrees    Right Wrist Extension 60 Degrees    Right Wrist Flexion 70 Degrees    Right Wrist Radial Deviation 28 Degrees    Right Wrist Ulnar Deviation 23 Degrees      Right Hand AROM   R Index  MCP 0-90 85 Degrees    R Index PIP 0-100 90 Degrees    R Long  MCP 0-90 85 Degrees    R Long PIP 0-100 90 Degrees    R Ring  MCP 0-90 80 Degrees    R Ring PIP 0-100 95 Degrees    R Little  MCP 0-90 80 Degrees    R Little PIP 0-100 90 Degrees              Patient  arrived this date with right upper extremity in a sling post fall a week ago per patient resulting in a proximal humerus nondisplaced fracture.  Patient can take sling off per orthopedic orders in shower and Relaxit.  Patient very fearful for moving.  Did get a verbal order from orthopedics this date to remove sling and assess elbow flexion and extension as well as supination and wrist. Patient was able to maintain her wrist motion compared to few weeks ago.  Except supination. Patient do report a little bit of swelling in the hand with stiffness. Reviewed with patient again her tendon glides for right digits As well as passive range of motion for thumb IP and composite flexion prior to opposition. Patient was a little bit more stiff in the thumb. Patient can do moist heat or contrast as needed and continue to wear her Isotoner glove. Husband with patient this date. Assessed with elbow to side elbow flexion and extension in neutral and pronation. After some moist heat to elbow and forearm patient is show increased elbow flexion and extension. Remind patient to stop when feeling a slight pull keeping it less than a 1-2/10. Patient to check with orthopedics today if she can do elbow flexion and extension. With arm on pillow assess supination to neutral only. Patient also check with orthopedics if she is allowed to work on supination supported on pillow. Patient made great progress in 6 visits from start of care at 12 weeks postop and digit flexion and extension as well as wrist and strength prior to this fall. Patient to contact me as needed for follow-up.                  OT Education - 06/27/22 1145     Education Details Home exercises for wrist and hand review    Person(s) Educated Patient    Methods Explanation;Demonstration;Tactile cues;Verbal cues;Handout    Comprehension Verbal cues required;Returned demonstration;Verbalized understanding              OT Short Term  Goals - 06/27/22 1152       OT SHORT TERM GOAL #1   Title Patient to be independent in home program for active assisted range of motion for wrist and forearm in all planes to show progress to be able to upgrade patient to 2 pound weight.    Baseline Patient made great  progress until last visit but had a fall with a proximal humerus nondisplaced fracture since then.  Limited in supination and no strengthening at this time.    Status Deferred               OT Long Term Goals - 06/27/22 1153       OT LONG TERM GOAL #1   Title Right wrist and forearm active range of motion increased to within functional limits for patient to turn a doorknob as well as push and pull heavy door with no increase symptoms.    Baseline Patient made great progress to within normal limits last time but had a fracture a week ago and proximal humerus strengthening and supination on hold    Status Deferred      OT LONG TERM GOAL #2   Title Right composite fist improved for patient to be able to touch palm and use tools and grip steering wheel without increase symptoms    Baseline Still able to make composite fist and touch palm but not using that hand at the moment because of proximal humerus nondisplaced fracture week ago.  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    Baseline Make great progress until last visit but had a fall a week ago in a sling with a nondisplaced proximal humerus fracture on the right    Status Deferred                   Plan - 06/27/22 1146     Clinical Impression Statement Patient presented OT evaluation with a referral from orthopedics for possible splinting for stiffness of digits.  Patient has a diagnosis of postop Colles' fracture R distal radius  ORIF  on 02/21/22.  Marland Kitchen  Patient was seen since early September for 6 visits and made great progress in right hand and wrist active range of motion as well as strength.  Patient is returned this date for follow-up-reports she had a fall about a week ago that resulted in a proximal humerus nondisplaced fracture.  Patient is being seen by orthopedics.  Patient in a sling since 06/19/2022.  Reviewed with patient her measurements for her wrist and hand and reviewed home program to maintain her progress with her right wrist and hand.  Pt to cont with contrast in the evening for swelling with a Isotoner glove at nighttime.  Did get a verbal order from orthopedics-Lance Mikle Bosworth PA-to take patient out of sling and do elbow flexion extension and supination pronation.  Elbow extension -30 to improved after some moist heat to elbow forearm to -25.  Elbow flexion was 127.  After heat increased to 140.  Patient to ask orthopedics if she can do light elbow flexion extension with elbow to side with the wrist in neutral and pronation.  As well as asking about supination pronation on a pillow.  Patient to continue with hand and wrist home exercises and follow-up with me when needed.  Husband with patient this date.  Patient has a follow-up with orthopedics at 1:00 today.    OT Occupational Profile and History Problem Focused Assessment - Including review of records relating to presenting problem    Occupational performance deficits (Please refer  to evaluation for details): ADL's;IADL's;Work;Play;Leisure;Social Participation    Body Structure / Function / Physical Skills ADL;Strength;Dexterity;Pain;Edema;UE functional use;IADL;ROM;Scar mobility;Flexibility    Rehab Potential Good    Clinical Decision Making Limited treatment options, no task modification necessary    Comorbidities Affecting Occupational Performance: None    Modification or Assistance to Complete Evaluation  No modification of tasks or assist necessary to complete eval    OT  Frequency 1x / week    OT Duration --   1 wk   OT Treatment/Interventions Self-care/ADL training;Fluidtherapy;Moist Heat;Contrast Bath;Paraffin;Manual Therapy;Patient/family education;Passive range of motion;Scar mobilization;Therapeutic exercise;Splinting;DME and/or AE instruction    Consulted and Agree with Plan of Care Patient             Patient will benefit from skilled therapeutic intervention in order to improve the following deficits and impairments:   Body Structure / Function / Physical Skills: ADL, Strength, Dexterity, Pain, Edema, UE functional use, IADL, ROM, Scar mobility, Flexibility       Visit Diagnosis: Stiffness of right hand, not elsewhere classified  Stiffness of right wrist, not elsewhere classified  Muscle weakness (generalized)  Pain in right hand    Problem List Patient Active Problem List   Diagnosis Date Noted   Closed right trimalleolar fracture, initial encounter 01/17/2019    Rosalyn Gess, OTR/L,CLT 06/27/2022, 11:54 AM  [facility] [address] [place], [address] Phone: [phone]   Fax:  [phone]  Name: Alice [facility] MRN: [id number] Date of Birth: 11-22-1960

## 2022-07-28 ENCOUNTER — Ambulatory Visit: Payer: PRIVATE HEALTH INSURANCE | Attending: Surgery | Admitting: Physical Therapy

## 2022-07-28 ENCOUNTER — Encounter: Payer: Self-pay | Admitting: Physical Therapy

## 2022-07-28 DIAGNOSIS — M25631 Stiffness of right wrist, not elsewhere classified: Secondary | ICD-10-CM | POA: Insufficient documentation

## 2022-07-28 DIAGNOSIS — M79641 Pain in right hand: Secondary | ICD-10-CM | POA: Diagnosis present

## 2022-07-28 DIAGNOSIS — M25641 Stiffness of right hand, not elsewhere classified: Secondary | ICD-10-CM | POA: Insufficient documentation

## 2022-07-28 DIAGNOSIS — M25511 Pain in right shoulder: Secondary | ICD-10-CM | POA: Insufficient documentation

## 2022-07-28 DIAGNOSIS — M6281 Muscle weakness (generalized): Secondary | ICD-10-CM | POA: Diagnosis present

## 2022-07-28 NOTE — Therapy (Signed)
OUTPATIENT PHYSICAL THERAPY SHOULDER EVALUATION   Patient Name: Alice [facility] MRN: [id number] DOB:[birth year], 61 y.o., female Today's Date: 07/28/2022  END OF SESSION:  PT End of Session - 07/28/22 0955     Visit Number 1    Number of Visits 17    Date for PT Re-Evaluation 09/29/22    Authorization - Visit Number 1    Authorization - Number of Visits 10    Progress Note Due on Visit 10    PT Start Time 1000    PT Stop Time 1045    PT Time Calculation (min) 45 min    Activity Tolerance Patient tolerated treatment well    Behavior During Therapy WFL for tasks assessed/performed             Past Medical History:  Diagnosis Date   Cancer ([place]) 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   GERD (gastroesophageal reflux disease)    History of kidney stones    PONV (postoperative nausea and vomiting)    x1 after orif ankle surgery   Past Surgical History:  Procedure Laterality Date   APPENDECTOMY     KNEE ARTHROSCOPY Right    KNEE ARTHROSCOPY Left 04/24/2018   Procedure: ARTHROSCOPY KNEE, PARTIAL MEDIAL MENISCECTOMY;  Surgeon: Dereck Leep, MD;  Location: [facility] ORS;  Service: Orthopedics;  Laterality: Left;   ORIF ANKLE FRACTURE Right 01/17/2019   Procedure: OPEN REDUCTION INTERNAL FIXATION (ORIF) ANKLE FRACTURE;  Surgeon: Hessie Knows, MD;  Location: [facility] ORS;  Service: Orthopedics;  Laterality: Right;   ORIF WRIST FRACTURE Right 02/21/2022   Procedure: OPEN REDUCTION INTERNAL FIXATION (ORIF) OR RIGHT DISTAL RADIUS FRACTURE.;  Surgeon: Corky Mull, MD;  Location: [facility] ORS;  Service: Orthopedics;  Laterality: Right;   Patient Active Problem List   Diagnosis Date Noted   Closed right trimalleolar fracture, initial encounter 01/17/2019    PCP: Alvera Singh FNP  REFERRING PROVIDER: Milagros Evener  REFERRING DIAG: R prox humerus fracture 06/19/22 (closed)  THERAPY DIAG:  Acute pain of right shoulder  Rationale for Evaluation and Treatment: Rehabilitation  ONSET DATE: 06/19/22  SUBJECTIVE:                                                                                                                                                                                       SUBJECTIVE STATEMENT: R prox humerus fracture 06/19/22 (closed)  PERTINENT HISTORY: Pt is a 61 year old female presenting s/p non-surgical closed proximal humerus fracture 06/19/22 resulting from fall. Patient was in immobile sling until 07/10/22. Since 07/10/22 has been letting arm dangle, has been moving elbow and wrist, has not completed overhead lifting. Reports current and best pain level  1/10; worst pain 9/10. Reports pain is worse at night, is sleeping sitting up. Pt is R handed. Lives with husband who is assisting her in dressing and bathing (does not have a shower seat or grab bars. Is using claw foot tub with husbands help). Husband is driving at this time. She does feed herself and brush her teeth with her left hand modI, reporting this is difficult. 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 Farming is labor intensive, and she needs to be able to climb to top of the barn to throw down hay, drive the tractor and complete farm chores. Pt denies N/V, B&B changes, unexplained weight fluctuation, saddle paresthesia, fever, night sweats, or unrelenting night pain at this time.   PAIN:  Are you having pain? Yes: NPRS scale: 9/10 Pain location: R shoulder Pain description: sharp, "healing" pain Aggravating factors: sleeping, night time, attempting reach Relieving factors: tylenol  PRECAUTIONS: Other: no ROM over shoulder height  WEIGHT BEARING RESTRICTIONS: No  FALLS:  Has patient fallen in last 6 months? Yes.  Number of falls 2  LIVING ENVIRONMENT: Lives with: lives with their spouse Lives in: House/apartment Stairs: Yes: External: 2 steps; none Has following equipment at home: None  OCCUPATION: Cattle farmer  PLOF: Independent  PATIENT GOALS:Be able to return to farming chores  NEXT MD VISIT: Dec 7th 2023  OBJECTIVE:   DIAGNOSTIC FINDINGS:  06/19/22 Mildly displaced and comminuted fracture of the humeral neck and lateral humeral head.  PATIENT SURVEYS:  FOTO 32 goal 81  COGNITION: Overall cognitive status: Within functional limits for tasks assessed     SENSATION: WFL  POSTURE: FHRS, increased thoracic kyphosis R UE guarding maintaining elbow flex and increased R shoulder hiking  UPPER EXTREMITY ROM:   Active/ Passive ROM Right eval Left eval  Shoulder flexion 140/88 WNL  Shoulder extension 45/NT WNL  Shoulder abduction 71/79 WNL  Shoulder adduction    Shoulder internal rotation PROM @ 70d abd 52d WNL  Shoulder external rotation PROM @ 70d abd 16d  WNL  Elbow flexion WNL WNL  Elbow extension 12 WNL  Wrist flexion 60 WNL  Wrist extension 24 WNL  Wrist ulnar deviation  WNL  Wrist radial deviation  WNL  Wrist pronation  WNL  Wrist supination  WNL  (Blank rows = not tested)  UPPER EXTREMITY MMT:  MMT Right eval Left eval  Shoulder flexion  5  Shoulder extension  5  Shoulder abduction  5  Shoulder adduction    Shoulder internal rotation  5  Shoulder external rotation  5  Middle trapezius    Lower trapezius    Elbow flexion 4- 5  Elbow extension 4-   Wrist flexion 2+   Wrist extension 2+   Wrist ulnar deviation    Wrist radial deviation    Wrist pronation    Wrist supination    Grip strength (lbs)    (Blank rows = not tested)  Break test of R elbow not performed   JOINT MOBILITY TESTING:  Guarded throughout  PALPATION:  trigger points with tension to R UT >L; and R mid trap and rhomboid group pec minor.   TODAY'S TREATMENT:  DATE: 07/28/22 PT reviewed the following HEP with patient with patient able to demonstrate a set of the following with min cuing for correction needed. PT educated patient on parameters of therex (how/when to inc/decrease intensity, frequency, rep/set range, stretch hold time, and purpose of therex) with verbalized understanding.   Access Code: [email or web address] - Supine Pec Stretch  - 3 x daily - 7 x weekly - 30-60sec hold - Seated Upper Trapezius Stretch  - 3 x daily - 7 x weekly - 30-60secc hold - Supine Shoulder Flexion Extension AAROM with Dowel  - 3 x daily - 7 x weekly - 12-20 reps - Supine Shoulder Abduction AAROM with Dowel  - 3 x daily - 7 x weekly - 12-20 reps   PATIENT EDUCATION: Education details: Patient was educated on diagnosis, anatomy and pathology involved, prognosis, role of PT, and was given an HEP, demonstrating exercise with proper form following verbal and tactile cues, and was given a paper hand out to continue exercise at home. Pt was educated on and agreed to plan of care. Person educated: Patient Education method: Explanation, Demonstration, Verbal cues, and Handouts Education comprehension: verbalized understanding and returned demonstration  HOME EXERCISE PROGRAM: [id number]  ASSESSMENT:  CLINICAL IMPRESSION: Patient is a 61 y.o. female who was seen today for physical therapy evaluation and treatment s/p closed proximal R humerus fx 06/19/22. Pertinent history of ORIF distal radial and ulnar fracture in June 2023 with interrupted POC for OT due to subsequent fall and R humerus fx. Impairments in R shoulder, elbow and wrist ROM;  decreased RTC and periscapular strength, posture impairments, decreased motor control and scapulohumeral rhythm and pain. Activity limitations in reaching behind the back, overhead reaching, lifting, carrying, driving, pulling, and  pushing; inhibiting full participation in being abe to complete farm chores for her livlihood, painting and part-time work as Community education officer. Would benefit from skilled PT to address above deficits and promote optimal return to PLOF.   OBJECTIVE IMPAIRMENTS: decreased activity tolerance, decreased coordination, decreased endurance, decreased mobility, decreased ROM, decreased strength, increased fascial restrictions, impaired perceived functional ability, impaired flexibility, impaired tone, impaired UE functional use, improper body mechanics, postural dysfunction, and pain.   ACTIVITY LIMITATIONS: carrying, lifting, sleeping, transfers, bathing, toileting, dressing, self feeding, reach over head, and hygiene/grooming  PARTICIPATION LIMITATIONS: meal prep, cleaning, driving, shopping, community activity, occupation, yard work, church, and farming  PERSONAL FACTORS: Age, Past/current experiences, Sex, Time since onset of injury/illness/exacerbation, and 1-2 comorbidities: simultaneous wrist fx, hx of cancer  are also affecting patient's functional outcome.   REHAB POTENTIAL: Good  CLINICAL DECISION MAKING: Evolving/moderate complexity  EVALUATION COMPLEXITY: Moderate   GOALS: Goals reviewed with patient? Yes  SHORT TERM GOALS: Target date: 08/25/22  Pt will be independent with HEP in order to improve strength and mobility in order to improve function at home and work.  Baseline:07/28/22 Goal status: INITIAL    LONG TERM GOALS: Target date: 07/28/22  Patient will increase FOTO score to 62 to demonstrate predicted increase in functional mobility to complete ADLs Baseline: 32 Goal status: INITIAL   2.  Pt will demonstrated R shoulder gross AROM of R shoulder WNL in order to complete ADLS Baseline: see eval Goal status: INITIAL  3.  Pt will demonstrated R shoulder and periscapular gross MMT of 4+/5 in order to complete ADLS Baseline: see eval Goal status: INITIAL  4.  Pt  will decrease worst pain as reported on NPRS by at least 3 points in order to demonstrate clinically significant reduction  in pain.  Baseline: 9/10 Goal status: INITIAL     PLAN:  PT FREQUENCY: 1-2x/week  PT DURATION: 8 weeks  PLANNED INTERVENTIONS: Therapeutic exercises, Therapeutic activity, Neuromuscular re-education, Balance training, Gait training, Patient/Family education, Self Care, Joint mobilization, DME instructions, Dry Needling, Spinal manipulation, Spinal mobilization, Cryotherapy, Moist heat, scar mobilization, Splintting, Taping, Traction, Ultrasound, Biofeedback, Manual therapy, and Re-evaluation  PLAN FOR NEXT SESSION: HEP review; grip and periscapular MMT   Durwin Reges, PT 07/28/2022, 11:19 AM

## 2022-07-31 ENCOUNTER — Ambulatory Visit: Payer: PRIVATE HEALTH INSURANCE | Admitting: Occupational Therapy

## 2022-07-31 ENCOUNTER — Ambulatory Visit: Payer: PRIVATE HEALTH INSURANCE | Admitting: Physical Therapy

## 2022-07-31 ENCOUNTER — Encounter: Payer: Self-pay | Admitting: Physical Therapy

## 2022-07-31 DIAGNOSIS — M25511 Pain in right shoulder: Secondary | ICD-10-CM

## 2022-07-31 DIAGNOSIS — M25631 Stiffness of right wrist, not elsewhere classified: Secondary | ICD-10-CM

## 2022-07-31 DIAGNOSIS — M79641 Pain in right hand: Secondary | ICD-10-CM

## 2022-07-31 DIAGNOSIS — M6281 Muscle weakness (generalized): Secondary | ICD-10-CM

## 2022-07-31 DIAGNOSIS — M25641 Stiffness of right hand, not elsewhere classified: Secondary | ICD-10-CM

## 2022-07-31 NOTE — Therapy (Signed)
OUTPATIENT PHYSICAL THERAPY SHOULDER EVALUATION   Patient Name: Alice Taylor MRN: [id number] DOB:12-Jun-1961, 61 y.o., female Today's Date: 08/01/2022  END OF SESSION:  PT End of Session - 07/31/22 1103     Visit Number 2 (P)     Number of Visits 17 (P)     Date for PT Re-Evaluation 09/29/22 (P)     Authorization - Visit Number 2 (P)     Authorization - Number of Visits 10 (P)     Progress Note Due on Visit 10 (P)     PT Start Time 1032 (P)     PT Stop Time 1110 (P)     PT Time Calculation (min) 38 min (P)     Activity Tolerance Patient tolerated treatment well (P)     Behavior During Therapy WFL for tasks assessed/performed (P)               Past Medical History:  Diagnosis Date   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   GERD (gastroesophageal reflux disease)    History of kidney stones    PONV (postoperative nausea and vomiting)    x1 after orif ankle surgery   Past Surgical History:  Procedure Laterality Date   APPENDECTOMY     KNEE ARTHROSCOPY Right    KNEE ARTHROSCOPY Left 04/24/2018   Procedure: ARTHROSCOPY KNEE, PARTIAL MEDIAL MENISCECTOMY;  Surgeon: Dereck Leep, MD;  Location: [facility];  Service: Orthopedics;  Laterality: Left;   ORIF ANKLE FRACTURE Right 01/17/2019   Procedure: OPEN REDUCTION INTERNAL FIXATION (ORIF) ANKLE FRACTURE;  Surgeon: Hessie Knows, MD;  Location: [facility];  Service: Orthopedics;  Laterality: Right;   ORIF WRIST FRACTURE Right 02/21/2022   Procedure: OPEN REDUCTION INTERNAL FIXATION (ORIF) OR RIGHT DISTAL RADIUS FRACTURE.;  Surgeon: Corky Mull, MD;  Location: [facility];  Service: Orthopedics;  Laterality: Right;   Patient Active Problem List   Diagnosis Date Noted   Closed right trimalleolar fracture, initial encounter 01/17/2019    PCP: Alvera Singh FNP  REFERRING PROVIDER: Milagros Evener  REFERRING DIAG: R prox humerus fracture 06/19/22 (closed)  THERAPY DIAG:  Acute pain of right shoulder  Rationale for Evaluation and Treatment:  Rehabilitation  ONSET DATE: 06/19/22  SUBJECTIVE:                                                                                                                                                                                      SUBJECTIVE STATEMENT: Pt reports current 6/10 this am, reporting 10/10 pain following Friday's evaluation and forgetting her ice pack Friday evening, and last night trying to sleep. Completing HEP, this is going well.  PERTINENT HISTORY: Pt  is a 61 year old female presenting s/p non-surgical closed proximal humerus fracture 06/19/22 resulting from fall. Patient was in immobile sling until 07/10/22. Since 07/10/22 has been letting arm dangle, has been moving elbow and wrist, has not completed overhead lifting. Reports current and best pain level 1/10; worst pain 9/10. Reports pain is worse at night, is sleeping sitting up. Pt is R handed. Lives with husband who is assisting her in dressing and bathing (does not have a shower seat or grab bars. Is using claw foot tub with husbands help). Husband is driving at this time. She does feed herself and brush her teeth with her left hand modI, reporting this is difficult. 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 Farming is labor intensive, and she needs to be able to climb to top of the barn to throw down hay, drive the tractor and complete farm chores. Pt denies N/V, B&B changes, unexplained weight fluctuation, saddle paresthesia, fever, night sweats, or unrelenting night pain at this time.   PAIN:  Are you having pain? Yes: NPRS scale: 9/10 Pain location: R shoulder Pain description: sharp, "healing" pain Aggravating  factors: sleeping, night time, attempting reach Relieving factors: tylenol  PRECAUTIONS: Other: no ROM over shoulder height  WEIGHT BEARING RESTRICTIONS: No  FALLS:  Has patient fallen in last 6 months? Yes. Number of falls 2  LIVING ENVIRONMENT: Lives with: lives with their spouse Lives in: House/apartment Stairs: Yes: External: 2 steps; none Has following equipment at home: None  OCCUPATION: Cattle farmer  PLOF: Independent  PATIENT GOALS:Be able to return to farming chores  NEXT MD VISIT: Dec 7th 2023  OBJECTIVE:   DIAGNOSTIC FINDINGS:  06/19/22 Mildly displaced and comminuted fracture of the humeral neck and lateral humeral head.  PATIENT SURVEYS:  FOTO 32 goal 72  COGNITION: Overall cognitive status: Within functional limits for tasks assessed     SENSATION: WFL  POSTURE: FHRS, increased thoracic kyphosis R UE guarding maintaining elbow flex and increased R shoulder hiking  UPPER EXTREMITY ROM:   Active/ Passive ROM Right eval Left eval  Shoulder flexion 140/88 WNL  Shoulder extension 45/NT WNL  Shoulder abduction 71/79 WNL  Shoulder adduction    Shoulder internal rotation PROM @ 70d abd 52d WNL  Shoulder external rotation PROM @ 70d abd 16d  WNL  Elbow flexion WNL WNL  Elbow extension 12 WNL  Wrist flexion 60 WNL  Wrist extension 24 WNL  Wrist ulnar deviation  WNL  Wrist radial deviation  WNL  Wrist pronation  WNL  Wrist supination  WNL  (Blank rows = not tested)  UPPER EXTREMITY MMT:  MMT Right eval Left eval  Shoulder flexion  5  Shoulder extension  5  Shoulder abduction  5  Shoulder adduction    Shoulder internal rotation  5  Shoulder external rotation  5  Middle trapezius    Lower trapezius    Elbow flexion 4- 5  Elbow extension 4-   Wrist flexion 2+   Wrist extension 2+   Wrist ulnar deviation    Wrist radial deviation    Wrist pronation    Wrist supination    Grip strength (lbs)    (  Blank rows = not tested)  Break  test of R elbow not performed   JOINT MOBILITY TESTING:  Guarded throughout  PALPATION:  trigger points with tension to R UT >L; and R mid trap and rhomboid group pec minor.   TODAY'S TREATMENT:                                                                                                                                          Ther-Ex Nustep AAROM elbow flex<>ext under 90d of flex 42mns seat 8 UE 10 Supine AAROM dowel flex and abd x12 each with cuing for true abd with good carry over; education on scapulohumeral rhythm Supine pec stretch 178m Isometric flex/IR/ER 4x each 10sec hold  Manual STM with trigger point release to R bicep, brachioradialis, pronator teres, UT and pec minor PROM all directions with pain as guide for stopping point; flex to 90d only   DATE: 07/28/22  PT reviewed the following HEP with patient with patient able to demonstrate a set of the following with min cuing for correction needed. PT educated patient on parameters of therex (how/when to inc/decrease intensity, frequency, rep/set range, stretch hold time, and purpose of therex) with verbalized understanding.   Access Code: [email or web address] Supine Pec Stretch  - 3 x daily - 7 x weekly - 30-60sec hold - Seated Upper Trapezius Stretch  - 3 x daily - 7 x weekly - 30-60secc hold - Supine Shoulder Flexion Extension AAROM with Dowel  - 3 x daily - 7 x weekly - 12-20 reps - Supine Shoulder Abduction AAROM with Dowel  - 3 x daily - 7 x weekly - 12-20 reps   PATIENT EDUCATION: Education details: Patient was educated on diagnosis, anatomy and pathology involved, prognosis, role of PT, and was given an HEP, demonstrating exercise with proper form following verbal and tactile cues, and was given a paper hand out to continue exercise at home. Pt was educated on and agreed to plan of care. Person educated: Patient Education method: Explanation, Demonstration, Verbal cues, and Handouts Education comprehension: verbalized  understanding and returned demonstration  HOME EXERCISE PROGRAM: [id number]:  CLINICAL IMPRESSION: Patient is a [age].o. female who was seen today for physical therapy evaluation and treatment s/p closed proximal R humerus fx 06/19/22. Pertinent history of ORIF distal radial and ulnar fracture in June 2023 with interrupted POC for OT due to subsequent fall and R humerus fx. Impairments in R shoulder, elbow and wrist ROM;  decreased RTC and periscapular strength, posture impairments, decreased motor control and scapulohumeral rhythm and pain. 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   OBJECTIVE IMPAIRMENTS: decreased  activity tolerance, decreased coordination, decreased endurance, decreased mobility, decreased ROM, decreased strength, increased fascial restrictions, impaired perceived functional ability, impaired flexibility, impaired tone, impaired UE functional use, improper body mechanics, postural dysfunction, and pain.   ACTIVITY LIMITATIONS: carrying, lifting, sleeping, transfers, bathing, toileting, dressing, self feeding, reach over head, and hygiene/grooming  PARTICIPATION LIMITATIONS: meal prep, cleaning, driving, shopping, community activity, occupation, yard work, church, and farming  PERSONAL FACTORS: Age, Past/current experiences, Sex, Time since onset of injury/illness/exacerbation, and 1-2 comorbidities: simultaneous wrist fx, hx of cancer  are also affecting patient's functional outcome.   REHAB POTENTIAL: Good  CLINICAL DECISION MAKING: Evolving/moderate complexity  EVALUATION COMPLEXITY: Moderate   GOALS: Goals reviewed with patient? Yes  SHORT TERM GOALS:  Target date: 08/25/22  Pt will be independent with HEP in order to improve strength and mobility in order to improve function at home and work.  Baseline:07/28/22 Goal status: INITIAL    LONG TERM GOALS: Target date: 07/28/22  Patient will increase FOTO score to 62 to demonstrate predicted increase in functional mobility to complete ADLs Baseline: 32 Goal status: INITIAL   2.  Pt will demonstrated R shoulder gross AROM of R shoulder WNL in order to complete ADLS Baseline: see eval Goal status: INITIAL  3.  Pt will demonstrated R shoulder and periscapular gross MMT of 4+/5 in order to complete ADLS Baseline: see eval Goal status: INITIAL  4.  Pt will decrease worst pain as reported on NPRS by at least 3 points in order to demonstrate clinically significant reduction in pain.  Baseline: 9/10 Goal status: INITIAL     PLAN:  PT FREQUENCY: 1-2x/week  PT DURATION: 8 weeks  PLANNED INTERVENTIONS: Therapeutic exercises, Therapeutic activity, Neuromuscular re-education, Balance training, Gait training, Patient/Family education, Self Care, Joint mobilization, DME instructions, Dry Needling, Spinal manipulation, Spinal mobilization, Cryotherapy, Moist heat, scar mobilization, Splintting, Taping, Traction, Ultrasound, Biofeedback, Manual therapy, and Re-evaluation  PLAN FOR NEXT SESSION: HEP review; grip and periscapular MMT  Durwin Reges DPT Durwin Reges, PT 08/01/2022, 12:49 PM

## 2022-07-31 NOTE — Therapy (Signed)
[facility] [address], [place], [address] Phone: [phone]   Fax:  [phone]  Occupational Therapy Treatment  Patient Details  Name: Alice Taylor MRN: [id number] Date of Birth: 1961/05/27 Referring Provider (OT): [place] PA   Encounter Date: 07/31/2022   OT End of Session - 07/31/22 1058     Visit Number 7    Number of Visits 13    Date for OT Re-Evaluation 09/11/22    OT Start Time 1116    OT Stop Time 1200    OT Time Calculation (min) 44 min    Activity Tolerance Patient tolerated treatment well    Behavior During Therapy [facility] for tasks assessed/performed             Past Medical History:  Diagnosis Date   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   GERD (gastroesophageal reflux disease)    History of kidney stones    PONV (postoperative nausea and vomiting)    x1 after orif ankle surgery    Past Surgical History:  Procedure Laterality Date   APPENDECTOMY     KNEE ARTHROSCOPY Right    KNEE ARTHROSCOPY Left 04/24/2018   Procedure: ARTHROSCOPY KNEE, PARTIAL MEDIAL MENISCECTOMY;  Surgeon: Dereck Leep, MD;  Location: [facility];  Service: Orthopedics;  Laterality: Left;   ORIF ANKLE FRACTURE Right 01/17/2019   Procedure: OPEN REDUCTION INTERNAL FIXATION (ORIF) ANKLE FRACTURE;  Surgeon: Hessie Knows, MD;  Location: [facility];  Service: Orthopedics;  Laterality: Right;   ORIF WRIST FRACTURE Right 02/21/2022   Procedure: OPEN REDUCTION INTERNAL FIXATION (ORIF) OR RIGHT DISTAL RADIUS FRACTURE.;  Surgeon: Corky Mull, MD;  Location: [facility];  Service: Orthopedics;  Laterality: Right;    There were no vitals filed for this visit.   Subjective Assessment - 07/31/22 1058     Subjective  I tried to work on my fingers and the wrist as much as I can when I was in the sling.  But my fingers got tight as well as my wrist.  And the rotation.  Was not able to get in my paraffin    Pertinent History Pt last seen by  PA at Ortho on 04/03/22 - Alice Taylor is a 61 y.o. female who presents for follow-up now 6 weeks status post an open reduction and internal fixation of her right distal radius fracture with an ulnar styloid fracture. Overall, the patient feels that she is doing well. She still notes some discomfort in her wrist which she rates at 4/10 on today's visit, and for which she has been taking Tylenol and applying ice as necessary with temporary partial relief of her symptoms. She been wearing her Velcro wrist immobilizer on a regular basis, removing it for bathing purposes and for exercises. She still notes moderate stiffness in the wrist and fingers, as well as some swelling, but denies any reinjury to the hand or wrist. She also denies any fevers or chills, and denies any numbness or paresthesias to her fingers.Xray showed good healing at radius and ulnar and refer to therapy - pt has been doing PT at [facility] 3 x wk per pt the last month - pt refer to OT for digits stiffness and possible splint. 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  As well as painting and being able to decorate at different events.    Currently in Pain? No/denies                [facility] OT Assessment - 07/31/22 0001       AROM   Right Forearm Pronation 90 Degrees    Right Forearm Supination 65 Degrees    Right Wrist Extension 54 Degrees    Right Wrist Flexion 70 Degrees    Right Wrist Radial Deviation 20 Degrees    Right Wrist Ulnar Deviation 20 Degrees      Right Hand AROM   R Index  MCP 0-90 85 Degrees    R Index PIP 0-100 80 Degrees    R Long  MCP 0-90 90 Degrees    R Long PIP 0-100 90 Degrees    R Ring  MCP 0-90 90 Degrees     R Ring PIP 0-100 100 Degrees    R Little  MCP 0-90 90 Degrees    R Little PIP 0-100 95 Degrees            Compared to before presents for patient with decreased wrist and forearm range of motion.  Patient was unable to use her paraffin at home.  Education was done this date for patient to start doing her paraffin at home as well as active assisted range of motion and passive range of motion for wrist and forearm. After paraffin soft tissue massage done with [place] tool #2 for sweeping and brushing over volar and dorsal forearm.          OT Treatments/Exercises (OP) - 07/31/22 0001       RUE Paraffin   Number Minutes Paraffin 8 Minutes    RUE Paraffin Location --   hand , wrist and forearm   Comments Light supination stretch with heating pad half of the time.  Prior to self                Also done soft tissue over volar digits and palm prior to review of home program for fisting. Patient to focus on PIP extension and lumbrical fist. Followed by passive range of motion for intrinsic assist.  Followed by composite fist but with pain at distal palmar crease to block 90 degree MC flexion and focus on composite and flexion at PIP. Patient demonstrated and verbalized understanding.       OT Education - 07/31/22 1058     Education Details Home exercises for wrist and hand review    Person(s) Educated Patient    Methods Explanation;Demonstration;Tactile cues;Verbal cues;Handout    Comprehension Verbal cues required;Returned demonstration;Verbalized understanding              OT Short Term Goals - 07/31/22 1251       OT SHORT TERM GOAL #1   Title Patient to be independent in home program for active assisted range of motion for wrist and forearm in all planes to show progress to be able to upgrade patient to 2 pound weight.    Status Deferred               OT Long Term Goals - 07/31/22 1252       OT LONG TERM GOAL #1   Title Right wrist and forearm  active range of  motion increased to within functional limits for patient to turn a doorknob as well as bath and dress using R hand more than 50%    Baseline sup 60; decrease wrist AROM compare to prior to last fall - see flowsheet    Time 6    Period Weeks    Status On-going    Target Date 09/11/22      OT LONG TERM GOAL #2   Title Right composite fist improved for patient to be able to touch palm to cut food do hair    Baseline End range composite flexion decrease -and end range PIP extention limited -    Time 6    Period Weeks    Status On-going    Target Date 09/11/22      OT LONG TERM GOAL #3   Title Right grip and prehension strength improved to more than 75% compared to left hand to be able to carry more than 5 pounds as well as 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    Baseline no strenghtening since last fall - 6 wks ago - -limited  in composite fist    Time 6    Period Weeks    Status On-going    Target Date 09/11/22                   Plan - 07/31/22 1101     Clinical Impression Statement Patient was seen by this OT after her diagnosis of postop Colles' fracture R distal radius  ORIF on 02/21/22.  Pt made great progress at that time in about 6 visits but then had had another fall early Oct that resulted in a proximal humerus nondisplaced fracture.  SHe was in sling for while and now ready to start PT for shoulder - and return to OT with increase stiffness in hand, wrist and forearm. REviewed with pt her measurements for her wrist and hand and reviewed home program. Pt present with decrease supination, wrist in all planes , composite fist and PIP extention - limiting her functional use of R dominant hand in ADL's and IADL"s    OT Occupational Profile and History Problem Focused Assessment - Including review of records relating to presenting problem    Occupational performance deficits (Please refer to evaluation for details):  ADL's;IADL's;Work;Play;Leisure;Social Participation    Body Structure / Function / Physical Skills ADL;Strength;Dexterity;Pain;Edema;UE functional use;IADL;ROM;Scar mobility;Flexibility    Rehab Potential Good    Clinical Decision Making Limited treatment options, no task modification necessary    Comorbidities Affecting Occupational Performance: None    Modification or Assistance to Complete Evaluation  No modification of tasks or assist necessary to complete eval    OT Frequency Biweekly    OT Duration 6 weeks    OT Treatment/Interventions Self-care/ADL training;Fluidtherapy;Moist Heat;Contrast Bath;Paraffin;Manual Therapy;Patient/family education;Passive range of motion;Scar mobilization;Therapeutic exercise;Splinting;DME and/or AE instruction    Consulted and Agree with Plan of Care Patient             Patient will benefit from skilled therapeutic intervention in order to improve the following deficits and impairments:   Body Structure / Function / Physical Skills: ADL, Strength, Dexterity, Pain, Edema, UE functional use, IADL, ROM, Scar mobility, Flexibility       Visit Diagnosis: Stiffness of right hand, not elsewhere classified  Muscle weakness (generalized)  Stiffness of right wrist, not elsewhere classified  Pain in right hand    Problem List Patient Active Problem List   Diagnosis  Date Noted   Closed right trimalleolar fracture, initial encounter 01/17/2019    Rosalyn Gess, OTR/L,CLT 07/31/2022, 12:56 PM  [facility] [address] [place], [address] Phone: [phone]   Fax:  [phone]  Name: Briley Bumgarner MRN: [id number] Date of Birth: 02-Apr-1961

## 2022-08-01 ENCOUNTER — Ambulatory Visit: Payer: PRIVATE HEALTH INSURANCE | Admitting: Physical Therapy

## 2022-08-01 ENCOUNTER — Encounter: Payer: Self-pay | Admitting: Physical Therapy

## 2022-08-07 ENCOUNTER — Ambulatory Visit: Payer: PRIVATE HEALTH INSURANCE | Admitting: Physical Therapy

## 2022-08-07 ENCOUNTER — Encounter: Payer: Self-pay | Admitting: Physical Therapy

## 2022-08-07 ENCOUNTER — Ambulatory Visit: Payer: PRIVATE HEALTH INSURANCE | Admitting: Occupational Therapy

## 2022-08-07 DIAGNOSIS — M25641 Stiffness of right hand, not elsewhere classified: Secondary | ICD-10-CM

## 2022-08-07 DIAGNOSIS — M25631 Stiffness of right wrist, not elsewhere classified: Secondary | ICD-10-CM

## 2022-08-07 DIAGNOSIS — M6281 Muscle weakness (generalized): Secondary | ICD-10-CM

## 2022-08-07 DIAGNOSIS — M79641 Pain in right hand: Secondary | ICD-10-CM

## 2022-08-07 DIAGNOSIS — M25511 Pain in right shoulder: Secondary | ICD-10-CM | POA: Diagnosis not present

## 2022-08-07 NOTE — Therapy (Signed)
[facility] [address] [place], [address] Phone: [phone]   Fax:  [phone]  Occupational Therapy Treatment  Patient Details  Name: Alice Taylor MRN: [id number] Date of Birth: Jul 04, 1961 Referring Provider (OT): [place] PA   Encounter Date: 08/07/2022   OT End of Session - 08/07/22 1346     Visit Number 8    Number of Visits 13    Date for OT Re-Evaluation 09/11/22    OT Start Time 1200    OT Stop Time 1244    OT Time Calculation (min) 44 min    Activity Tolerance Patient tolerated treatment well    Behavior During Therapy [facility] for tasks assessed/performed             Past Medical History:  Diagnosis Date   Cancer ([place]) 1970   Skin   GERD (gastroesophageal reflux disease)    History of kidney stones    PONV (postoperative nausea and vomiting)    x1 after orif ankle surgery    Past Surgical History:  Procedure Laterality Date   APPENDECTOMY     KNEE ARTHROSCOPY Right    KNEE ARTHROSCOPY Left 04/24/2018   Procedure: ARTHROSCOPY KNEE, PARTIAL MEDIAL MENISCECTOMY;  Surgeon: Dereck Leep, MD;  Location: [facility] ORS;  Service: Orthopedics;  Laterality: Left;   ORIF ANKLE FRACTURE Right 01/17/2019   Procedure: OPEN REDUCTION INTERNAL FIXATION (ORIF) ANKLE FRACTURE;  Surgeon: Hessie Knows, MD;  Location: [facility] ORS;  Service: Orthopedics;  Laterality: Right;   ORIF WRIST FRACTURE Right 02/21/2022   Procedure: OPEN REDUCTION INTERNAL FIXATION (ORIF) OR RIGHT DISTAL RADIUS FRACTURE.;  Surgeon: Corky Mull, MD;  Location: [facility] ORS;  Service: Orthopedics;  Laterality: Right;    There were no vitals filed for this visit.   Subjective Assessment - 08/07/22 1344     Subjective  I just a little down today.  My arm and hand is tight and sore.  Had trouble sleeping last night.  I did start the paraffin on my hand.    Pertinent History Pt last seen by PA at Ortho on 04/03/22 - Alice Taylor is a 61 y.o.  female who presents for follow-up now 6 weeks status post an open reduction and internal fixation of her right distal radius fracture with an ulnar styloid fracture. Overall, the patient feels that she is doing well. She still notes some discomfort in her wrist which she rates at 4/10 on today's visit, and for which she has been taking Tylenol and applying ice as necessary with temporary partial relief of her symptoms. She been wearing her Velcro wrist immobilizer on a regular basis, removing it for bathing purposes and for exercises. She still notes moderate stiffness in the wrist and fingers, as well as some swelling, but denies any reinjury to the hand or wrist. She also denies any fevers or chills, and denies any numbness or paresthesias to her fingers.Xray showed good healing at radius and ulnar and refer to therapy - pt has been doing PT at [facility] 3 x wk per pt the last month - pt refer to OT for digits stiffness and possible splint. 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  As well as painting and being able to decorate at different events.    Currently in Pain? Yes    Pain Score 1     Pain Location Hand    Pain Orientation Right    Pain Descriptors / Indicators Tightness;Sore    Pain Type Surgical pain    Pain Onset More than a month ago                [facility] OT Assessment - 08/07/22 0001       AROM   Right Forearm Pronation 90 Degrees    Right Forearm Supination 75 Degrees    Right Wrist Extension 58 Degrees    Right Wrist Flexion 70 Degrees    Right Wrist Radial Deviation 24 Degrees    Right Wrist Ulnar Deviation 15 Degrees      Right Hand AROM   R Thumb MCP 0-60 60 Degrees    R  Thumb IP 0-80 45 Degrees    R Thumb Radial ABduction/ADduction 0-55 48    R Thumb Palmar ABduction/ADduction 0-45 55    R Thumb Opposition to Index --   opposition 2nd fold of 5th   R Index  MCP 0-90 80 Degrees    R Index PIP 0-100 85 Degrees    R Long  MCP 0-90 90 Degrees    R Long PIP 0-100 95 Degrees    R Ring  MCP 0-90 90 Degrees    R Ring PIP 0-100 100 Degrees    R Little  MCP 0-90 90 Degrees    R Little PIP 0-100 100 Degrees               Patient reports she was able to use her paraffin 1 time since last time seen. Had increased discomfort and pain in shoulder and scapula down to hand over the weekend.  Could not sleep good last night.  Increased stiffness in the hand especially thumb webspace.  Range of motion compared to last time about the same except supination did improve.  Had some increase thumb pain and tightness in the webspace.  Patient probably compensating more with lateral pinch because of inability to do supination.   I reviewed again the use of paraffin at home as well as active assisted range of motion and passive range of motion for wrist and forearm. After paraffin soft tissue massage to webspace as well as metacarpal spreads.  Done with Rhona Leavens tool #2 for sweeping and brushing over volar and dorsal forearm as well as volar hand and digits prior to passive range of motion                         OT Treatments/Exercises (OP) - 08/07/22 0001       RUE Paraffin   Number Minutes Paraffin 8 Minutes    RUE Paraffin Location Hand   wrist   Comments light supination stretch - heatingpad              After soft tissue mobilization PROM to digits as well as thumb IP and MCP flexion prior to any attempts of opposition. Patient to focus on PIP extension and lumbrical fist. Followed by passive range of motion for intrinsic assist.  Followed by composite fist but with pain at distal palmar crease to block 90 degree MC flexion and focus on  composite and flexion at  PIP. Wrist flexion extension as well as supination pronation active assisted range of motion x 10 reps Thumb palmar and radial abduction active assisted range of motion. Patient demonstrated and verbalized understanding.       OT Education - 08/07/22 1346     Education Details Home exercises for wrist and hand review    Person(s) Educated Patient    Methods Explanation;Demonstration;Tactile cues;Verbal cues;Handout    Comprehension Verbal cues required;Returned demonstration;Verbalized understanding              OT Short Term Goals - 07/31/22 1251       OT SHORT TERM GOAL #1   Title Patient to be independent in home program for active assisted range of motion for wrist and forearm in all planes to show progress to be able to upgrade patient to 2 pound weight.    Status Deferred               OT Long Term Goals - 07/31/22 1252       OT LONG TERM GOAL #1   Title Right wrist and forearm active range of motion increased to within functional limits for patient to turn a doorknob as well as bath and dress using R hand more than 50%    Baseline sup 60; decrease wrist AROM compare to prior to last fall - see flowsheet    Time 6    Period Weeks    Status On-going    Target Date 09/11/22      OT LONG TERM GOAL #2   Title Right composite fist improved for patient to be able to touch palm to cut food do hair    Baseline End range composite flexion decrease -and end range PIP extention limited -    Time 6    Period Weeks    Status On-going    Target Date 09/11/22      OT LONG TERM GOAL #3   Title Right grip and prehension strength improved to more than 75% compared to left hand to be able to carry more than 5 pounds as well as 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    Baseline no strenghtening since last fall - 6 wks ago - -limited  in composite fist    Time 6    Period Weeks    Status On-going    Target Date 09/11/22                    Plan - 08/07/22 1346     OT Occupational Profile and History Problem Focused Assessment - Including review of records relating to presenting problem    Occupational performance deficits (Please refer to evaluation for details): ADL's;IADL's;Work;Play;Leisure;Social Participation    Body Structure / Function / Physical Skills ADL;Strength;Dexterity;Pain;Edema;UE functional use;IADL;ROM;Scar mobility;Flexibility    Rehab Potential Good    Clinical Decision Making Limited treatment options, no task modification necessary    Comorbidities Affecting Occupational Performance: None    Modification or Assistance to Complete Evaluation  No modification of tasks or assist necessary to complete eval    OT Frequency --   increase to 2 x this week to progress ROM   OT Duration 6 weeks    OT Treatment/Interventions Self-care/ADL training;Fluidtherapy;Moist Heat;Contrast Bath;Paraffin;Manual Therapy;Patient/family education;Passive range of motion;Scar mobilization;Therapeutic exercise;Splinting;DME and/or AE instruction    Consulted and Agree with Plan of Care Patient             Patient will benefit from  skilled therapeutic intervention in order to improve the following deficits and impairments:   Body Structure / Function / Physical Skills: ADL, Strength, Dexterity, Pain, Edema, UE functional use, IADL, ROM, Scar mobility, Flexibility       Visit Diagnosis: Muscle weakness (generalized)  Stiffness of right hand, not elsewhere classified  Stiffness of right wrist, not elsewhere classified  Pain in right hand    Problem List Patient Active Problem List   Diagnosis Date Noted   Closed right trimalleolar fracture, initial encounter 01/17/2019    Rosalyn Gess, OTR/L,CLT 08/07/2022, 1:48 PM  [facility] [address], [place], [address] Phone: [phone]   Fax:   [phone]  Name: Alice Taylor MRN: [id number] Date of Birth: [birth year]

## 2022-08-07 NOTE — Therapy (Signed)
OUTPATIENT PHYSICAL THERAPY SHOULDER EVALUATION   Patient Name: Arrow Emmerich MRN: [id number] DOB:07-06-1961, 61 y.o., female Today's Date: 08/07/2022  END OF SESSION:  PT End of Session - 08/07/22 1328     Visit Number 3    Number of Visits 17    Date for PT Re-Evaluation 09/29/22    Authorization - Visit Number 3    Authorization - Number of Visits 10    Progress Note Due on Visit 10    PT Start Time 5809    PT Stop Time 1344    PT Time Calculation (min) 41 min    Activity Tolerance Patient tolerated treatment well    Behavior During Therapy WFL for tasks assessed/performed               Past Medical History:  Diagnosis Date   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   GERD (gastroesophageal reflux disease)    History of kidney stones    PONV (postoperative nausea and vomiting)    x1 after orif ankle surgery   Past Surgical History:  Procedure Laterality Date   APPENDECTOMY     KNEE ARTHROSCOPY Right    KNEE ARTHROSCOPY Left 04/24/2018   Procedure: ARTHROSCOPY KNEE, PARTIAL MEDIAL MENISCECTOMY;  Surgeon: Dereck Leep, MD;  Location: [facility] ORS;  Service: Orthopedics;  Laterality: Left;   ORIF ANKLE FRACTURE Right 01/17/2019   Procedure: OPEN REDUCTION INTERNAL FIXATION (ORIF) ANKLE FRACTURE;  Surgeon: Hessie Knows, MD;  Location: [facility] OR[facility];  Service: Orthopedics;  Laterality: Right;   ORIF WRIST FRACTURE Right 02/21/2022   Procedure: OPEN REDUCTION INTERNAL FIXATION (ORIF) OR RIGHT DISTAL RADIUS FRACTURE.;  Surgeon: Corky Mull, MD;  Location: [facility] OR[facility];  Service: Orthopedics;  Laterality: Right;   Patient Active Problem List   Diagnosis Date Noted   Closed right trimalleolar fracture, initial encounter 01/17/2019    PCP: Alvera Singh FNP  REFERRING PROVIDER: Milagros Evener  REFERRING DIAG: R prox humerus fracture 06/19/22 (closed)  THERAPY DIAG:  Muscle weakness (generalized)  Rationale for Evaluation and Treatment: Rehabilitation  ONSET DATE: 06/19/22  SUBJECTIVE:                                                                                                                                                                                       SUBJECTIVE STATEMENT: Pt doing well to date. 6/10 pain currently.   PERTINENT HISTORY: Pt is a 61 year old female presenting s/p non-surgical closed proximal humerus fracture 06/19/22 resulting from fall. Patient was in immobile sling until 07/10/22. Since 07/10/22 has been letting arm dangle, has been moving elbow and wrist, has not completed overhead lifting. Reports current and  best pain level 1/10; worst pain 9/10. Reports pain is worse at night, is sleeping sitting up. Pt is R handed. Lives with husband who is assisting her in dressing and bathing (does not have a shower seat or grab bars. Is using claw foot tub with husbands help). Husband is driving at this time. She does feed herself and brush her teeth with her left hand modI, reporting this is difficult. 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 Farming is labor intensive, and she needs to be able to climb to top of the barn to throw down hay, drive the tractor and complete farm chores. Pt denies N/V, B&B changes, unexplained weight fluctuation, saddle paresthesia, fever, night sweats, or unrelenting night pain at this time.   PAIN:  Are you having pain? Yes: NPRS scale: 9/10 Pain location: R shoulder Pain description: sharp, "healing" pain Aggravating factors: sleeping, night time, attempting reach Relieving factors: tylenol  PRECAUTIONS: Other: no ROM over shoulder height  WEIGHT BEARING RESTRICTIONS: No  FALLS:  Has patient fallen in last 6 months?  Yes. Number of falls 2  LIVING ENVIRONMENT: Lives with: lives with their spouse Lives in: House/apartment Stairs: Yes: External: 2 steps; none Has following equipment at home: None  OCCUPATION: Cattle farmer  PLOF: Independent  PATIENT GOALS:Be able to return to farming chores  NEXT MD VISIT: Dec 7th 2023  OBJECTIVE:   DIAGNOSTIC FINDINGS:  06/19/22 Mildly displaced and comminuted fracture of the humeral neck and lateral humeral head.  PATIENT SURVEYS:  FOTO 32 goal 30  COGNITION: Overall cognitive status: Within functional limits for tasks assessed     SENSATION: WFL  POSTURE: FHRS, increased thoracic kyphosis R UE guarding maintaining elbow flex and increased R shoulder hiking  UPPER EXTREMITY ROM:   Active/ Passive ROM Right eval Left eval  Shoulder flexion 140/88 WNL  Shoulder extension 45/NT WNL  Shoulder abduction 71/79 WNL  Shoulder adduction    Shoulder internal rotation PROM @ 70d abd 52d WNL  Shoulder external rotation PROM @ 70d abd 16d  WNL  Elbow flexion WNL WNL  Elbow extension 12 WNL  Wrist flexion 60 WNL  Wrist extension 24 WNL  Wrist ulnar deviation  WNL  Wrist radial deviation  WNL  Wrist pronation  WNL  Wrist supination  WNL  (Blank rows = not tested)  UPPER EXTREMITY MMT:  MMT Right eval Left eval  Shoulder flexion  5  Shoulder extension  5  Shoulder abduction  5  Shoulder adduction    Shoulder internal rotation  5  Shoulder external rotation  5  Middle trapezius    Lower trapezius    Elbow flexion 4- 5  Elbow extension 4-   Wrist flexion 2+   Wrist extension 2+   Wrist ulnar deviation    Wrist radial deviation    Wrist pronation    Wrist supination    Grip strength (lbs)    (Blank rows = not tested)  Break test of R elbow not performed   JOINT MOBILITY TESTING:  Guarded throughout  PALPATION:  trigger points with tension to R UT >L; and R mid trap and rhomboid group pec minor.   TODAY'S TREATMENT:  Ther-Ex Nustep AAROM elbow flex<>ext under 90d of flex 75mns seat 8 UE 10 Supine AAROM dowel flex and abd x12 each with cuing for true flex and abd with good carry over; education on scapulohumeral rhythm Supine 1# curl with ext into pillow x12 with pt reporting no pain throughout Supine AAROM dowel 90d flex retraction <> protraction with cuing for  Isometric flex/abd/ER 4x each 10sec hold  Manual STM with trigger point release to R UT and pec minor; bicep, brachioradialis, pronator teres, subsequently continuing while pt holds head of 2# DB to promote forearm supination with UE supported in max elbow ext PROM all directions with pain as guide for stopping point; flex to 90d only   DATE: 07/28/22  PT reviewed the following HEP with patient with patient able to demonstrate a set of the following with min cuing for correction needed. PT educated patient on parameters of therex (how/when to inc/decrease intensity, frequency, rep/set range, stretch hold time, and purpose of therex) with verbalized understanding.   Access Code: [email or web address]- Supine Pec Stretch  - 3 x daily - 7 x weekly - 30-60sec hold - Seated Upper Trapezius Stretch  - 3 x daily - 7 x weekly - 30-60secc hold - Supine Shoulder Flexion Extension AAROM with Dowel  - 3 x daily - 7 x weekly - 12-20 reps - Supine Shoulder Abduction AAROM with Dowel  - 3 x daily - 7 x weekly - 12-20 reps   PATIENT EDUCATION: Education details: Patient was educated on diagnosis, anatomy and pathology involved, prognosis, role of PT, and was given an HEP, demonstrating exercise with proper form following verbal and tactile cues, and was given a paper hand out to continue exercise at home. Pt was educated on and agreed to plan of care. Person educated: Patient Education method: Explanation, Demonstration, Verbal cues, and  Handouts Education comprehension: verbalized understanding and returned demonstration  HOME EXERCISE PROGRAM: [id number] ASSESSMENT:  CLINICAL IMPRESSION: Pt continued therex progression and manual techniques for increased mobility with success. Pt reports no increased pain throughout session with pain as guide and surgical precautions followed. Would benefit from skilled PT to address above deficits and promote optimal return to PLOF.   OBJECTIVE IMPAIRMENTS: decreased activity tolerance, decreased coordination, decreased endurance, decreased mobility, decreased ROM, decreased strength, increased fascial restrictions, impaired perceived functional ability, impaired flexibility, impaired tone, impaired UE functional use, improper body mechanics, postural dysfunction, and pain.   ACTIVITY LIMITATIONS: carrying, lifting, sleeping, transfers, bathing, toileting, dressing, self feeding, reach over head, and hygiene/grooming  PARTICIPATION LIMITATIONS: meal prep, cleaning, driving, shopping, community activity, occupation, yard work, church, and farming  PERSONAL FACTORS: Age, Past/current experiences, Sex, Time since onset of injury/illness/exacerbation, and 1-2 comorbidities: simultaneous wrist fx, hx of cancer  are also affecting patient's functional outcome.   REHAB POTENTIAL: Good  CLINICAL DECISION MAKING: Evolving/moderate complexity  EVALUATION COMPLEXITY: Moderate   GOALS: Goals reviewed with patient? Yes  SHORT TERM GOALS: Target date: 08/25/22  Pt will be independent with HEP in order to improve strength and mobility in order to improve function at home and work.  Baseline:07/28/22 Goal status: INITIAL    LONG TERM GOALS: Target date: 07/28/22  Patient will increase FOTO score to 62 to demonstrate predicted increase in functional mobility to complete ADLs Baseline: 32 Goal status: INITIAL   2.  Pt will demonstrated R shoulder gross AROM of R shoulder WNL in order to  complete ADLS Baseline: see eval Goal status: INITIAL  3.  Pt will demonstrated  R shoulder and periscapular gross MMT of 4+/5 in order to complete ADLS Baseline: see eval Goal status: INITIAL  4.  Pt will decrease worst pain as reported on NPRS by at least 3 points in order to demonstrate clinically significant reduction in pain.  Baseline: 9/10 Goal status: INITIAL     PLAN:  PT FREQUENCY: 1-2x/week  PT DURATION: 8 weeks  PLANNED INTERVENTIONS: Therapeutic exercises, Therapeutic activity, Neuromuscular re-education, Balance training, Gait training, Patient/Family education, Self Care, Joint mobilization, DME instructions, Dry Needling, Spinal manipulation, Spinal mobilization, Cryotherapy, Moist heat, scar mobilization, Splintting, Taping, Traction, Ultrasound, Biofeedback, Manual therapy, and Re-evaluation  PLAN FOR NEXT SESSION: HEP review; grip and periscapular MMT  Durwin Reges DPT Durwin Reges, PT 08/07/2022, 3:14 PM

## 2022-08-10 ENCOUNTER — Encounter: Payer: Self-pay | Admitting: Physical Therapy

## 2022-08-10 ENCOUNTER — Ambulatory Visit: Payer: PRIVATE HEALTH INSURANCE | Admitting: Occupational Therapy

## 2022-08-10 ENCOUNTER — Ambulatory Visit: Payer: PRIVATE HEALTH INSURANCE | Admitting: Physical Therapy

## 2022-08-10 DIAGNOSIS — M25511 Pain in right shoulder: Secondary | ICD-10-CM | POA: Diagnosis not present

## 2022-08-10 DIAGNOSIS — M25631 Stiffness of right wrist, not elsewhere classified: Secondary | ICD-10-CM

## 2022-08-10 DIAGNOSIS — M25641 Stiffness of right hand, not elsewhere classified: Secondary | ICD-10-CM

## 2022-08-10 DIAGNOSIS — M6281 Muscle weakness (generalized): Secondary | ICD-10-CM

## 2022-08-10 DIAGNOSIS — M79641 Pain in right hand: Secondary | ICD-10-CM

## 2022-08-10 NOTE — Therapy (Signed)
[facility] [address], [place], [address] Phone: [phone]   Fax:  [phone]  Occupational Therapy Treatment  Patient Details  Name: Sarra Rachels MRN: [id number] Date of Birth: [birth year] Referring Provider (OT): [place] PA   Encounter Date: 08/10/2022   OT End of Session - 08/10/22 1230     Visit Number 9    Number of Visits 13    Date for OT Re-Evaluation 09/11/22    OT Start Time 1208    OT Stop Time 1245    OT Time Calculation (min) 37 min    Activity Tolerance Patient tolerated treatment well    Behavior During Therapy [facility] for tasks assessed/performed             Past Medical History:  Diagnosis Date   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   GERD (gastroesophageal reflux disease)    History of kidney stones    PONV (postoperative nausea and vomiting)    x1 after orif ankle surgery    Past Surgical History:  Procedure Laterality Date   APPENDECTOMY     KNEE ARTHROSCOPY Right    KNEE ARTHROSCOPY Left 04/24/2018   Procedure: ARTHROSCOPY KNEE, PARTIAL MEDIAL MENISCECTOMY;  Surgeon: Dereck Leep, MD;  Location: [facility] ORS;  Service: Orthopedics;  Laterality: Left;   ORIF ANKLE FRACTURE Right 01/17/2019   Procedure: OPEN REDUCTION INTERNAL FIXATION (ORIF) ANKLE FRACTURE;  Surgeon: Hessie Knows, MD;  Location: [facility] ORS;  Service: Orthopedics;  Laterality: Right;   ORIF WRIST FRACTURE Right 02/21/2022   Procedure: OPEN REDUCTION INTERNAL FIXATION (ORIF) OR RIGHT DISTAL RADIUS FRACTURE.;  Surgeon: Corky Mull, MD;  Location: [facility] ORS;  Service: Orthopedics;  Laterality: Right;    There were no vitals filed for this visit.   Subjective Assessment - 08/10/22 1228     Subjective  I feels since last time hand and wrist not as tight -elbow getting better - not as tight    Pertinent History Pt last seen by PA at Ortho on 04/03/22 - Alice Taylor is a 61 y.o. female who presents for follow-up now 6 weeks  status post an open reduction and internal fixation of her right distal radius fracture with an ulnar styloid fracture. Overall, the patient feels that she is doing well. She still notes some discomfort in her wrist which she rates at 4/10 on today's visit, and for which she has been taking Tylenol and applying ice as necessary with temporary partial relief of her symptoms. She been wearing her Velcro wrist immobilizer on a regular basis, removing it for bathing purposes and for exercises. She still notes moderate stiffness in the wrist and fingers, as well as some swelling, but denies any reinjury to the hand or wrist. She also denies any fevers or chills, and denies any numbness or paresthesias to her fingers.Xray showed good healing at radius and ulnar and refer to therapy - pt has been doing PT at [facility] 3 x wk per pt the last month - pt refer to OT for digits stiffness and possible splint. 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  As well as painting and being able to decorate at different events.    Currently in Pain? Yes    Pain Score 3     Pain Location Hand    Pain Orientation Right    Pain Descriptors / Indicators Aching;Tightness    Pain Type Acute pain    Pain Onset More than a month ago                [facility] OT Assessment - 08/10/22 0001       AROM   Right Forearm Pronation 90 Degrees    Right Forearm Supination 80 Degrees    Right Wrist Extension 70 Degrees    Right Wrist Flexion 75 Degrees    Right Wrist Radial Deviation 24 Degrees    Right Wrist Ulnar Deviation 22 Degrees      Right Hand AROM   R Thumb MCP 0-60 60 Degrees    R Thumb IP 0-80 40 Degrees    R Thumb Radial  ABduction/ADduction 0-55 48    R Thumb Palmar ABduction/ADduction 0-45 55    R Thumb Opposition to Index --   Opposition to base of 5th                     OT Treatments/Exercises (OP) - 08/10/22 0001       RUE Paraffin   Number Minutes Paraffin 8 Minutes    RUE Paraffin Location Hand   wrist   Comments light sup/ wrist ext strech - soft tissue            Patient reports she was able to use her paraffin and feel like hand and wrist not as tight since last time  Range of motion compared to last time  made some good progress in sup and wrist ext  Flexion still decrease .  THumb AROM improving good   I reviewed again the use of paraffin at home as well as active assisted range of motion and passive range of motion for wrist and forearm. After paraffin soft tissue massage to webspace as well as metacarpal spreads.  Done with Rhona Leavens tool #2 for sweeping and brushing over volar and dorsal forearm as well as volar hand and digits prior to passive range of motion Prior to review prayer stretch horizontal towards her - 10 reps  Great progress  And wrist flexion over armrest  Focus on intrinsic fist this date -stretch in heat  Followed by AAROM prior to composite fist 12 reps PIP extention to cont to focus on to as intrinsic fist and composite increase           OT Education - 08/10/22 1230     Education Details Home exercises for wrist and hand review    Person(s) Educated Patient    Methods Explanation;Demonstration;Tactile cues;Verbal cues;Handout    Comprehension Verbal cues required;Returned demonstration;Verbalized understanding              OT Short Term Goals - 07/31/22 1251       OT SHORT TERM GOAL #1   Title Patient to be independent in home program for active assisted range of motion for wrist and forearm in all planes to show progress to be able to upgrade patient to 2 pound weight.    Status Deferred               OT Long Term  Goals - 07/31/22 1252       OT LONG  TERM GOAL #1   Title Right wrist and forearm active range of motion increased to within functional limits for patient to turn a doorknob as well as bath and dress using R hand more than 50%    Baseline sup 60; decrease wrist AROM compare to prior to last fall - see flowsheet    Time 6    Period Weeks    Status On-going    Target Date 09/11/22      OT LONG TERM GOAL #2   Title Right composite fist improved for patient to be able to touch palm to cut food do hair    Baseline End range composite flexion decrease -and end range PIP extention limited -    Time 6    Period Weeks    Status On-going    Target Date 09/11/22      OT LONG TERM GOAL #3   Title Right grip and prehension strength improved to more than 75% compared to left hand to be able to carry more than 5 pounds as well as 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    Baseline no strenghtening since last fall - 6 wks ago - -limited  in composite fist    Time 6    Period Weeks    Status On-going    Target Date 09/11/22                   Plan - 08/10/22 1231     Clinical Impression Statement Patient was seen by this OT after her diagnosis of postop Colles' fracture R distal radius  ORIF on 02/21/22.  Pt made great progress at that time in about 6 visits but then had had another fall early Oct that resulted in a proximal humerus nondisplaced fracture.  SHe was in sling for while and  started working with PT for shoulder - and return to OT with increase stiffness in hand, wrist and forearm but making great progress the last 2 sessions. Reviewed with pt her measurements for her wrist/forearm and hand and updated home program. Pt present with decrease supination, wrist in all planes , composite fist and PIP extention - limiting her functional use of R dominant hand in ADL's and IADL"s    OT Occupational Profile and History Problem Focused Assessment - Including review of  records relating to presenting problem    Occupational performance deficits (Please refer to evaluation for details): ADL's;IADL's;Work;Play;Leisure;Social Participation    Body Structure / Function / Physical Skills ADL;Strength;Dexterity;Pain;Edema;UE functional use;IADL;ROM;Scar mobility;Flexibility    Rehab Potential Good    Clinical Decision Making Limited treatment options, no task modification necessary    Comorbidities Affecting Occupational Performance: None    Modification or Assistance to Complete Evaluation  No modification of tasks or assist necessary to complete eval    OT Frequency 1x / week    OT Duration 4 weeks    OT Treatment/Interventions Self-care/ADL training;Fluidtherapy;Moist Heat;Contrast Bath;Paraffin;Manual Therapy;Patient/family education;Passive range of motion;Scar mobilization;Therapeutic exercise;Splinting;DME and/or AE instruction    Consulted and Agree with Plan of Care Patient             Patient will benefit from skilled therapeutic intervention in order to improve the following deficits and impairments:   Body Structure / Function / Physical Skills: ADL, Strength, Dexterity, Pain, Edema, UE functional use, IADL, ROM, Scar mobility, Flexibility       Visit Diagnosis: Muscle weakness (generalized)  Stiffness of right hand, not elsewhere classified  Stiffness  of right wrist, not elsewhere classified  Pain in right hand    Problem List Patient Active Problem List   Diagnosis Date Noted   Closed right trimalleolar fracture, initial encounter 01/17/2019    Rosalyn Gess, OTR/L,CLT 08/10/2022, 12:55 PM  [facility] [address], [place], [address] Phone: [phone]   Fax:  [phone]  Name: Krystall Kruckenberg MRN: [id number] Date of Birth: 29-Jul-1961

## 2022-08-10 NOTE — Therapy (Signed)
OUTPATIENT PHYSICAL THERAPY SHOULDER EVALUATION   Patient Name: Alice Taylor MRN: [id number] DOB:January 04, 1961, 61 y.o., female Today's Date: 08/10/2022  END OF SESSION:  PT End of Session - 08/10/22 1343     Visit Number 4 (P)     Number of Visits 17 (P)     Date for PT Re-Evaluation 09/29/22 (P)     Authorization - Visit Number 4 (P)     Authorization - Number of Visits 10 (P)     Progress Note Due on Visit 10 (P)     PT Start Time 1305 (P)     PT Stop Time 1345 (P)     PT Time Calculation (min) 40 min (P)     Activity Tolerance Patient tolerated treatment well (P)     Behavior During Therapy WFL for tasks assessed/performed (P)                 Past Medical History:  Diagnosis Date   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   GERD (gastroesophageal reflux disease)    History of kidney stones    PONV (postoperative nausea and vomiting)    x1 after orif ankle surgery   Past Surgical History:  Procedure Laterality Date   APPENDECTOMY     KNEE ARTHROSCOPY Right    KNEE ARTHROSCOPY Left 04/24/2018   Procedure: ARTHROSCOPY KNEE, PARTIAL MEDIAL MENISCECTOMY;  Surgeon: Dereck Leep, MD;  Location: [facility] ORS;  Service: Orthopedics;  Laterality: Left;   ORIF ANKLE FRACTURE Right 01/17/2019   Procedure: OPEN REDUCTION INTERNAL FIXATION (ORIF) ANKLE FRACTURE;  Surgeon: Hessie Knows, MD;  Location: [facility] OR[facility];  Service: Orthopedics;  Laterality: Right;   ORIF WRIST FRACTURE Right 02/21/2022   Procedure: OPEN REDUCTION INTERNAL FIXATION (ORIF) OR RIGHT DISTAL RADIUS FRACTURE.;  Surgeon: Corky Mull, MD;  Location: [facility] OR[facility];  Service: Orthopedics;  Laterality: Right;   Patient Active Problem List   Diagnosis Date Noted   Closed right trimalleolar fracture, initial encounter 01/17/2019    PCP: Alvera Singh FNP  REFERRING PROVIDER: Milagros Evener  REFERRING DIAG: R prox humerus fracture 06/19/22 (closed)  THERAPY DIAG:  Acute pain of right shoulder  Rationale for Evaluation and  Treatment: Rehabilitation  ONSET DATE: 06/19/22  SUBJECTIVE:                                                                                                                                                                                      SUBJECTIVE STATEMENT: Pt doing well to date. 6/10 pain currently.   PERTINENT HISTORY: Pt is a 61 year old female presenting s/p non-surgical closed proximal humerus fracture 06/19/22 resulting from fall. Patient was in  immobile sling until 07/10/22. Since 07/10/22 has been letting arm dangle, has been moving elbow and wrist, has not completed overhead lifting. Reports current and best pain level 1/10; worst pain 9/10. Reports pain is worse at night, is sleeping sitting up. Pt is R handed. Lives with husband who is assisting her in dressing and bathing (does not have a shower seat or grab bars. Is using claw foot tub with husbands help). Husband is driving at this time. She does feed herself and brush her teeth with her left hand modI, reporting this is difficult. 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 Farming is labor intensive, and she needs to be able to climb to top of the barn to throw down hay, drive the tractor and complete farm chores. Pt denies N/V, B&B changes, unexplained weight fluctuation, saddle paresthesia, fever, night sweats, or unrelenting night pain at this time.   PAIN:  Are you having pain? Yes: NPRS scale: 9/10 Pain location: R shoulder Pain description: sharp, "healing" pain Aggravating factors: sleeping, night time, attempting reach Relieving factors: tylenol  PRECAUTIONS: Other: no ROM over shoulder height  WEIGHT BEARING  RESTRICTIONS: No  FALLS:  Has patient fallen in last 6 months? Yes. Number of falls 2  LIVING ENVIRONMENT: Lives with: lives with their spouse Lives in: House/apartment Stairs: Yes: External: 2 steps; none Has following equipment at home: None  OCCUPATION: Cattle farmer  PLOF: Independent  PATIENT GOALS:Be able to return to farming chores  NEXT MD VISIT: Dec 7th 2023  OBJECTIVE:   DIAGNOSTIC FINDINGS:  06/19/22 Mildly displaced and comminuted fracture of the humeral neck and lateral humeral head.  PATIENT SURVEYS:  FOTO 32 goal 74  COGNITION: Overall cognitive status: Within functional limits for tasks assessed     SENSATION: WFL  POSTURE: FHRS, increased thoracic kyphosis R UE guarding maintaining elbow flex and increased R shoulder hiking  UPPER EXTREMITY ROM:   Active/ Passive ROM Right eval Left eval  Shoulder flexion 140/88 WNL  Shoulder extension 45/NT WNL  Shoulder abduction 71/79 WNL  Shoulder adduction    Shoulder internal rotation PROM @ 70d abd 52d WNL  Shoulder external rotation PROM @ 70d abd 16d  WNL  Elbow flexion WNL WNL  Elbow extension 12 WNL  Wrist flexion 60 WNL  Wrist extension 24 WNL  Wrist ulnar deviation  WNL  Wrist radial deviation  WNL  Wrist pronation  WNL  Wrist supination  WNL  (Blank rows = not tested)  UPPER EXTREMITY MMT:  MMT Right eval Left eval  Shoulder flexion  5  Shoulder extension  5  Shoulder abduction  5  Shoulder adduction    Shoulder internal rotation  5  Shoulder external rotation  5  Middle trapezius    Lower trapezius    Elbow flexion 4- 5  Elbow extension 4-   Wrist flexion 2+   Wrist extension 2+   Wrist ulnar deviation    Wrist radial deviation    Wrist pronation    Wrist supination    Grip strength (lbs)    (Blank rows = not tested)  Break test of R elbow not performed   JOINT MOBILITY TESTING:  Guarded  throughout  PALPATION:  trigger points with tension to R UT >L; and R mid  trap and rhomboid group pec minor.   TODAY'S TREATMENT:                                                                                                                                          Ther-Ex Supine AAROM dowel flex, abd, IR x12 each with cuing for true flex and abd with good carry over; education on scapulohumeral rhythm Supine flex AROM 2x5 Sidelying abd AROM 2x6 Sidelying ER AROM 2x6 with cuing for elbow ext and forearm supination to neutral with good carry over HEP update of AROM   Manual STM with trigger point release to R UT and pec minor; bicep, brachioradialis, pronator teres Following: Dry Needling: (2/2) 71m .25 needles placed along the R Biceps and pronator teres to decrease increased muscular spasms and trigger points with the patient positioned in supine. Patient was educated on risks and benefits of therapy and verbally consents to PT.  PROM all directions with pain as guide for stopping point   DATE: 07/28/22  PT reviewed the following HEP with patient with patient able to demonstrate a set of the following with min cuing for correction needed. PT educated patient on parameters of therex (how/when to inc/decrease intensity, frequency, rep/set range, stretch hold time, and purpose of therex) with verbalized understanding.   Access Code: [email or web address]- Supine Pec Stretch  - 3 x daily - 7 x weekly - 30-60sec hold - Seated Upper Trapezius Stretch  - 3 x daily - 7 x weekly - 30-60secc hold - Supine Shoulder Flexion Extension AAROM with Dowel  - 2-3 x daily - 7 x weekly - 12-20 reps - Supine Shoulder Abduction AAROM with Dowel  - 2-3 x daily - 7 x weekly - 12-20 reps - Supine Shoulder External Rotation in 45 Degrees Abduction AAROM with Dowel  - 2-3 x daily - 7 x weekly - 12-20 reps - Supine Shoulder Flexion Extension Full Range AROM  - 1 x daily - 1-2 x weekly - 2-3 sets - 5-10 reps - Sidelying Shoulder Abduction Palm Forward  - 1 x daily - 1-2 x weekly - 2-3 sets - 5-10  reps - Sidelying Shoulder External Rotation AROM  - 1 x daily - 1-2 x weekly - 2-3 sets - 5-10 reps   PATIENT EDUCATION: Education details: Patient was educated on diagnosis, anatomy and pathology involved, prognosis, role of PT, and was given an HEP, demonstrating exercise with proper form following verbal and tactile cues, and was given a paper hand out to continue exercise at home. Pt was educated on and agreed to plan of care. Person educated: Patient Education method: Explanation, Demonstration, Verbal cues, and Handouts Education comprehension: verbalized understanding and returned demonstration  HOME EXERCISE PROGRAM: [id number] ASSESSMENT:  CLINICAL IMPRESSION: Pt continued therex progression and manual techniques for  increased mobility with success. Pt reports no increased pain throughout session with pain as guide and surgical precautions followed. PT introduced AROM this session in supine and sidelying positions with HEP update for this with good understanding from patient. Pt responds well to TPDN with increased elbow ext and forearm supination following. Would benefit from skilled PT to address above deficits and promote optimal return to PLOF.   OBJECTIVE IMPAIRMENTS: decreased activity tolerance, decreased coordination, decreased endurance, decreased mobility, decreased ROM, decreased strength, increased fascial restrictions, impaired perceived functional ability, impaired flexibility, impaired tone, impaired UE functional use, improper body mechanics, postural dysfunction, and pain.   ACTIVITY LIMITATIONS: carrying, lifting, sleeping, transfers, bathing, toileting, dressing, self feeding, reach over head, and hygiene/grooming  PARTICIPATION LIMITATIONS: meal prep, cleaning, driving, shopping, community activity, occupation, yard work, church, and farming  PERSONAL FACTORS: Age, Past/current experiences, Sex, Time since onset of injury/illness/exacerbation, and 1-2  comorbidities: simultaneous wrist fx, hx of cancer  are also affecting patient's functional outcome.   REHAB POTENTIAL: Good  CLINICAL DECISION MAKING: Evolving/moderate complexity  EVALUATION COMPLEXITY: Moderate   GOALS: Goals reviewed with patient? Yes  SHORT TERM GOALS: Target date: 08/25/22  Pt will be independent with HEP in order to improve strength and mobility in order to improve function at home and work.  Baseline:07/28/22 Goal status: INITIAL    LONG TERM GOALS: Target date: 07/28/22  Patient will increase FOTO score to 62 to demonstrate predicted increase in functional mobility to complete ADLs Baseline: 32 Goal status: INITIAL   2.  Pt will demonstrated R shoulder gross AROM of R shoulder WNL in order to complete ADLS Baseline: see eval Goal status: INITIAL  3.  Pt will demonstrated R shoulder and periscapular gross MMT of 4+/5 in order to complete ADLS Baseline: see eval Goal status: INITIAL  4.  Pt will decrease worst pain as reported on NPRS by at least 3 points in order to demonstrate clinically significant reduction in pain.  Baseline: 9/10 Goal status: INITIAL     PLAN:  PT FREQUENCY: 1-2x/week  PT DURATION: 8 weeks  PLANNED INTERVENTIONS: Therapeutic exercises, Therapeutic activity, Neuromuscular re-education, Balance training, Gait training, Patient/Family education, Self Care, Joint mobilization, DME instructions, Dry Needling, Spinal manipulation, Spinal mobilization, Cryotherapy, Moist heat, scar mobilization, Splintting, Taping, Traction, Ultrasound, Biofeedback, Manual therapy, and Re-evaluation  PLAN FOR NEXT SESSION: HEP review; grip and periscapular MMT  Durwin Reges DPT Durwin Reges, PT 08/10/2022, 3:19 PM

## 2022-08-14 ENCOUNTER — Ambulatory Visit: Payer: PRIVATE HEALTH INSURANCE | Attending: Surgery

## 2022-08-14 DIAGNOSIS — M25641 Stiffness of right hand, not elsewhere classified: Secondary | ICD-10-CM | POA: Diagnosis present

## 2022-08-14 DIAGNOSIS — M79641 Pain in right hand: Secondary | ICD-10-CM | POA: Insufficient documentation

## 2022-08-14 DIAGNOSIS — M25631 Stiffness of right wrist, not elsewhere classified: Secondary | ICD-10-CM | POA: Insufficient documentation

## 2022-08-14 DIAGNOSIS — M25511 Pain in right shoulder: Secondary | ICD-10-CM | POA: Insufficient documentation

## 2022-08-14 DIAGNOSIS — M6281 Muscle weakness (generalized): Secondary | ICD-10-CM | POA: Insufficient documentation

## 2022-08-14 NOTE — Therapy (Signed)
OUTPATIENT PHYSICAL THERAPY SHOULDER TREATMENT   Patient Name: Alice Taylor MRN: [id number] DOB:04-Dec-1960, 61 y.o., female Today's Date: 08/14/2022  END OF SESSION:  PT End of Session - 08/14/22 1138     Visit Number 5    Number of Visits 17    Date for PT Re-Evaluation 09/29/22    Authorization Type [employer]    Authorization Time Period 07/28/22-10/11/21    Authorization - Visit Number 4    Authorization - Number of Visits 10    Progress Note Due on Visit 10    PT Start Time 1443   pt arrived late   PT Stop Time 1210    PT Time Calculation (min) 35 min    Activity Tolerance Patient tolerated treatment well;No increased pain    Behavior During Therapy WFL for tasks assessed/performed                Past Medical History:  Diagnosis Date   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   GERD (gastroesophageal reflux disease)    History of kidney stones    PONV (postoperative nausea and vomiting)    x1 after orif ankle surgery   Past Surgical History:  Procedure Laterality Date   APPENDECTOMY     KNEE ARTHROSCOPY Right    KNEE ARTHROSCOPY Left 04/24/2018   Procedure: ARTHROSCOPY KNEE, PARTIAL MEDIAL MENISCECTOMY;  Surgeon: Dereck Leep, MD;  Location: [facility];  Service: Orthopedics;  Laterality: Left;   ORIF ANKLE FRACTURE Right 01/17/2019   Procedure: OPEN REDUCTION INTERNAL FIXATION (ORIF) ANKLE FRACTURE;  Surgeon: Hessie Knows, MD;  Location: [facility];  Service: Orthopedics;  Laterality: Right;   ORIF WRIST FRACTURE Right 02/21/2022   Procedure: OPEN REDUCTION INTERNAL FIXATION (ORIF) OR RIGHT DISTAL RADIUS FRACTURE.;  Surgeon: Corky Mull, MD;  Location: [facility];  Service: Orthopedics;  Laterality: Right;   Patient Active Problem List   Diagnosis Date Noted   Closed right trimalleolar fracture, initial encounter 01/17/2019    PCP: Alvera Singh FNP  REFERRING PROVIDER: Milagros Evener  REFERRING DIAG: R prox humerus fracture 06/19/22 (closed)  THERAPY DIAG:   Muscle weakness (generalized)  Stiffness of right hand, not elsewhere classified  Stiffness of right wrist, not elsewhere classified  Rationale for Evaluation and Treatment: Rehabilitation  ONSET DATE: 06/19/22  SUBJECTIVE:                                                                                                                                                                                      SUBJECTIVE STATEMENT: Pt reports feeling sore appropriately after dry needling, felt to be more mobile in shoulder. Pain is high today for no  clear reason.  She sees [facility] on Wednesday, will ask about updates to 90 degree restriction in shoulder.   PERTINENT HISTORY: Pt is a 61 year old female presenting s/p non-surgical closed proximal humerus fracture 06/19/22 resulting from fall. Patient was in immobile sling until 07/10/22. Since 07/10/22 has been letting arm dangle, has been moving elbow and wrist, has not completed overhead lifting. Reports current and best pain level 1/10; worst pain 9/10. Reports pain is worse at night, is sleeping sitting up. Pt is R handed. Lives with husband who is assisting her in dressing and bathing (does not have a shower seat or grab bars. Is using claw foot tub with husbands help). Husband is driving at this time. She does feed herself and brush her teeth with her left hand modI, reporting this is difficult. 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 Farming is labor intensive, and she needs to be able to climb to top of the barn to throw down hay, drive the tractor and complete farm chores. Pt denies N/V, B&B changes, unexplained weight fluctuation, saddle  paresthesia, fever, night sweats, or unrelenting night pain at this time.   PAIN:  Are you having pain? Yes; 5/10 Rt anterior shoulder; 2/10 Rt biceps area pain   PRECAUTIONS: Other: no ROM over shoulder height per Cameron Proud PA 07/17/22   WEIGHT BEARING RESTRICTIONS: No  FALLS:  Has patient fallen in last 6 months? Yes. Number of falls 2   OCCUPATION: Cattle farmer  PLOF: Independent  PATIENT GOALS:Be able to return to farming chores  NEXT MD VISIT: Dec 6th 2023 with Kieth Brightly TREATMENT:                                                                                                                                         -UBE 60sec fwd, 60sec backward, 60sec fwd, 60sec backward  -seated flexion table slides 15x3secH, 3x30sec H (ROM to ~75 degrees, better when active)  -seated ABDCT table slides 15x3secH, 3x30sec H (ROM limited <60 degrees)  -supine long level flexion 1x15  -supine neutral GHJ elbow flexion/ABDCT 1x15 -short lever flexion overhead to isometric shoulder extension intowel towel 1x15 -90 degrees flexion with ABDCT dropout and return 1x10 to 45 degrees  -Left sidelying Rt shoulder ABDCT 1x15 -Left sidleying Rt shoulder short arc flexion/extension 1x10  -Left sidelying Rt shoulder ABDCT 1x15 -Left sidleying Rt shoulder short arc flexion/extension 1x10    Home exercises:  PT reviewed the following HEP with patient with patient able to demonstrate a set of the following with min  cuing for correction needed. PT educated patient on parameters of therex (how/when to inc/decrease intensity, frequency, rep/set range, stretch hold time, and purpose of therex) with verbalized understanding.   Access Code: [email or web address] - Supine Pec Stretch  - 3 x daily - 7 x weekly - 30-60sec hold - Seated Upper Trapezius Stretch  - 3 x daily - 7 x weekly - 30-60secc hold - Supine Shoulder Flexion Extension AAROM with Dowel  - 2-3 x daily - 7 x weekly - 12-20 reps - Supine  Shoulder Abduction AAROM with Dowel  - 2-3 x daily - 7 x weekly - 12-20 reps - Supine Shoulder External Rotation in 45 Degrees Abduction AAROM with Dowel  - 2-3 x daily - 7 x weekly - 12-20 reps - Supine Shoulder Flexion Extension Full Range AROM  - 1 x daily - 1-2 x weekly - 2-3 sets - 5-10 reps - Sidelying Shoulder Abduction Palm Forward  - 1 x daily - 1-2 x weekly - 2-3 sets - 5-10 reps - Sidelying Shoulder External Rotation AROM  - 1 x daily - 1-2 x weekly - 2-3 sets - 5-10 reps   PATIENT EDUCATION: Education details: Patient was educated on diagnosis, anatomy and pathology involved, prognosis, role of PT, and was given an HEP, demonstrating exercise with proper form following verbal and tactile cues, and was given a paper hand out to continue exercise at home. Pt was educated on and agreed to plan of care. Person educated: Patient Education method: Explanation, Demonstration, Verbal cues, and Handouts Education comprehension: verbalized understanding and returned demonstration  HOME EXERCISE PROGRAM: [id number]  ASSESSMENT:  CLINICAL IMPRESSION: ROM remains limited in Right GHJ quite a bit. ROM interventions are tolerated fairly, but pt has significant end range discomfort. Generally strength is progressing well, but difficult to note given persistent stiffness and ROM restriction. Will benefit from skilled PT to address above deficits and promote optimal return to PLOF.   OBJECTIVE IMPAIRMENTS: decreased activity tolerance, decreased coordination, decreased endurance, decreased mobility, decreased ROM, decreased strength, increased fascial restrictions, impaired perceived functional ability, impaired flexibility, impaired tone, impaired UE functional use, improper body mechanics, postural dysfunction, and pain.   ACTIVITY LIMITATIONS: carrying, lifting, sleeping, transfers, bathing, toileting, dressing, self feeding, reach over head, and hygiene/grooming  PARTICIPATION LIMITATIONS: meal  prep, cleaning, driving, shopping, community activity, occupation, yard work, church, and farming  PERSONAL FACTORS: Age, Past/current experiences, Sex, Time since onset of injury/illness/exacerbation, and 1-2 comorbidities: simultaneous wrist fx, hx of cancer  are also affecting patient's functional outcome.   REHAB POTENTIAL: Good  CLINICAL DECISION MAKING: Evolving/moderate complexity  EVALUATION COMPLEXITY: Moderate   GOALS: Goals reviewed with patient? Yes  SHORT TERM GOALS: Target date: 08/25/22  Pt will be independent with HEP in order to improve strength and mobility in order to improve function at home and work.  Baseline:07/28/22 Goal status: INITIAL    LONG TERM GOALS: Target date: 07/28/22  Patient will increase FOTO score to 62 to demonstrate predicted increase in functional mobility to complete ADLs Baseline: 32 Goal status: INITIAL   2.  Pt will demonstrated R shoulder gross AROM of R shoulder WNL in order to complete ADLS Baseline: see eval Goal status: INITIAL  3.  Pt will demonstrated R shoulder and periscapular gross MMT of 4+/5 in order to complete ADLS Baseline: see eval Goal status: INITIAL  4.  Pt will decrease worst pain as reported on NPRS by at least 3 points in order to demonstrate clinically significant reduction in pain.  Baseline: 9/10 Goal status: INITIAL     PLAN:  PT FREQUENCY: 1-2x/week  PT DURATION: 8 weeks  PLANNED INTERVENTIONS: Therapeutic exercises, Therapeutic activity, Neuromuscular re-education, Balance training, Gait training, Patient/Family education, Self Care, Joint mobilization, DME instructions, Dry Needling, Spinal manipulation, Spinal mobilization, Cryotherapy, Moist heat, scar mobilization, Splintting, Taping, Traction, Ultrasound, Biofeedback, Manual therapy, and Re-evaluation  PLAN FOR NEXT SESSION: FU on ortho visit and updates, continue with ROM and strength as permitted   84:69 AM, 08/14/22 Etta Grandchild,  PT, DPT Physical Therapist - [place] [phone] (Office)   Alice Taylor, PT 08/14/2022, 11:52 AM

## 2022-08-17 ENCOUNTER — Ambulatory Visit: Payer: PRIVATE HEALTH INSURANCE | Admitting: Physical Therapy

## 2022-08-17 ENCOUNTER — Ambulatory Visit: Payer: PRIVATE HEALTH INSURANCE | Admitting: Occupational Therapy

## 2022-08-17 DIAGNOSIS — M6281 Muscle weakness (generalized): Secondary | ICD-10-CM

## 2022-08-17 DIAGNOSIS — M79641 Pain in right hand: Secondary | ICD-10-CM

## 2022-08-17 DIAGNOSIS — M25511 Pain in right shoulder: Secondary | ICD-10-CM

## 2022-08-17 DIAGNOSIS — M25631 Stiffness of right wrist, not elsewhere classified: Secondary | ICD-10-CM

## 2022-08-17 DIAGNOSIS — M25641 Stiffness of right hand, not elsewhere classified: Secondary | ICD-10-CM

## 2022-08-17 NOTE — Therapy (Signed)
OUTPATIENT PHYSICAL THERAPY SHOULDER TREATMENT   Patient Name: Alice Taylor MRN: [id number] DOB:03/29/1961, 61 y.o., female Today's Date: 08/18/2022  END OF SESSION:  PT End of Session - 08/17/22 1110     Visit Number 6    Number of Visits 17    Date for PT Re-Evaluation 09/29/22    Authorization Type [employer]    Authorization Time Period 07/28/22-10/11/21    Authorization - Visit Number 5    Authorization - Number of Visits 10    PT Start Time 1497    PT Stop Time 1127    PT Time Calculation (min) 31 min    Activity Tolerance Patient tolerated treatment well;No increased pain    Behavior During Therapy WFL for tasks assessed/performed                 Past Medical History:  Diagnosis Date   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   GERD (gastroesophageal reflux disease)    History of kidney stones    PONV (postoperative nausea and vomiting)    x1 after orif ankle surgery   Past Surgical History:  Procedure Laterality Date   APPENDECTOMY     KNEE ARTHROSCOPY Right    KNEE ARTHROSCOPY Left 04/24/2018   Procedure: ARTHROSCOPY KNEE, PARTIAL MEDIAL MENISCECTOMY;  Surgeon: Dereck Leep, MD;  Location: [facility];  Service: Orthopedics;  Laterality: Left;   ORIF ANKLE FRACTURE Right 01/17/2019   Procedure: OPEN REDUCTION INTERNAL FIXATION (ORIF) ANKLE FRACTURE;  Surgeon: Hessie Knows, MD;  Location: [facility];  Service: Orthopedics;  Laterality: Right;   ORIF WRIST FRACTURE Right 02/21/2022   Procedure: OPEN REDUCTION INTERNAL FIXATION (ORIF) OR RIGHT DISTAL RADIUS FRACTURE.;  Surgeon: Corky Mull, MD;  Location: [facility];  Service: Orthopedics;  Laterality: Right;   Patient Active Problem List   Diagnosis Date Noted   Closed right trimalleolar fracture, initial encounter 01/17/2019    PCP: Alvera Singh FNP  REFERRING PROVIDER: Milagros Evener  REFERRING DIAG: R prox humerus fracture 06/19/22 (closed)  THERAPY DIAG:  Acute pain of right shoulder  Rationale for  Evaluation and Treatment: Rehabilitation  ONSET DATE: 06/19/22  SUBJECTIVE:                                                                                                                                                                                      SUBJECTIVE STATEMENT: Pt reports feeling sore appropriately after dry needling, felt to be more mobile in shoulder. Pain is high today for no clear reason.  She sees [facility] on Wednesday, will ask about updates to 90 degree restriction in shoulder.   PERTINENT HISTORY: Pt is a 60  year old female presenting s/p non-surgical closed proximal humerus fracture 06/19/22 resulting from fall. Patient was in immobile sling until 07/10/22. Since 07/10/22 has been letting arm dangle, has been moving elbow and wrist, has not completed overhead lifting. Reports current and best pain level 1/10; worst pain 9/10. Reports pain is worse at night, is sleeping sitting up. Pt is R handed. Lives with husband who is assisting her in dressing and bathing (does not have a shower seat or grab bars. Is using claw foot tub with husbands help). Husband is driving at this time. She does feed herself and brush her teeth with her left hand modI, reporting this is difficult. 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 Farming is labor intensive, and she needs to be able to climb to top of the barn to throw down hay, drive the tractor and complete farm chores. Pt denies N/V, B&B changes, unexplained weight fluctuation, saddle paresthesia, fever, night sweats, or unrelenting night pain at this time.   PAIN:  Are you having pain? Yes; 5/10 Rt anterior shoulder; 2/10 Rt biceps area  pain   PRECAUTIONS: Other: no ROM over shoulder height per Cameron Proud PA 07/17/22   WEIGHT BEARING RESTRICTIONS: No  FALLS:  Has patient fallen in last 6 months? Yes. Number of falls 2   OCCUPATION: Cattle farmer  PLOF: Independent  PATIENT GOALS:Be able to return to farming chores  NEXT MD VISIT: Dec 6th 2023 with Kieth Brightly TREATMENT:                                                                                                                                         -UBE 60sec fwd, 60sec backward, 60sec fwd, 60sec backward  - reclined AAROM overhead flex with dowel x15 - reclined AROM flex x12 with cuing for scapulohumeral rhythm -seated flexion table slides 15x3secH, 3x30sec H (ROM to ~75 degrees, better when active)  -seated ABDCT table slides 15x3secH, 3x30sec H (ROM limited <60 degrees)  -supine long level flexion 1x15  -supine neutral GHJ elbow flexion/ABDCT 1x15 -short lever flexion overhead to isometric shoulder extension intowel towel 1x15 -90 degrees flexion with ABDCT dropout and return 1x10 to 45 degrees  -Left sidelying Rt shoulder ABDCT 1x15 -Left sidleying Rt shoulder short arc flexion/extension 1x10  -Left sidelying Rt shoulder ABDCT 1x15 -Left sidleying Rt shoulder short arc flexion/extension 1x10    Supine AAROM dowel flex, abd, IR x12 each with cuing for true flex and abd with good carry over; education on scapulohumeral rhythm Supine flex AROM 2x5 Sidelying abd AROM  2x6 Sidelying ER AROM 2x6 with cuing for elbow ext and forearm supination to neutral with good carry over HEP update of AROM    Manual STM with trigger point release to R UT and pec minor; bicep, brachioradialis, pronator teres Following: Dry Needling: (2/2) 85m .25 needles placed along the R Biceps and pronator teres to decrease increased muscular spasms and trigger points with the patient positioned in supine. Patient was educated on risks and benefits of therapy and verbally  consents to PT.  PROM all directions with pain as guide for stopping point  Home exercises:  PT reviewed the following HEP with patient with patient able to demonstrate a set of the following with min cuing for correction needed. PT educated patient on parameters of therex (how/when to inc/decrease intensity, frequency, rep/set range, stretch hold time, and purpose of therex) with verbalized understanding.   Access Code: [email or web address]- Supine Pec Stretch  - 3 x daily - 7 x weekly - 30-60sec hold - Seated Upper Trapezius Stretch  - 3 x daily - 7 x weekly - 30-60secc hold - Supine Shoulder Flexion Extension AAROM with Dowel  - 2-3 x daily - 7 x weekly - 12-20 reps - Supine Shoulder Abduction AAROM with Dowel  - 2-3 x daily - 7 x weekly - 12-20 reps - Supine Shoulder External Rotation in 45 Degrees Abduction AAROM with Dowel  - 2-3 x daily - 7 x weekly - 12-20 reps - Supine Shoulder Flexion Extension Full Range AROM  - 1 x daily - 1-2 x weekly - 2-3 sets - 5-10 reps - Sidelying Shoulder Abduction Palm Forward  - 1 x daily - 1-2 x weekly - 2-3 sets - 5-10 reps - Sidelying Shoulder External Rotation AROM  - 1 x daily - 1-2 x weekly - 2-3 sets - 5-10 reps   PATIENT EDUCATION: Education details: Patient was educated on diagnosis, anatomy and pathology involved, prognosis, role of PT, and was given an HEP, demonstrating exercise with proper form following verbal and tactile cues, and was given a paper hand out to continue exercise at home. Pt was educated on and agreed to plan of care. Person educated: Patient Education method: Explanation, Demonstration, Verbal cues, and Handouts Education comprehension: verbalized understanding and returned demonstration  HOME EXERCISE PROGRAM: [id number] ASSESSMENT:  CLINICAL IMPRESSION: ROM remains limited in Right GHJ quite a bit. ROM interventions are tolerated fairly, but pt has significant end range discomfort. Generally strength is progressing well, but  difficult to note given persistent stiffness and ROM restriction. Will benefit from skilled PT to address above deficits and promote optimal return to PLOF.   OBJECTIVE IMPAIRMENTS: decreased activity tolerance, decreased coordination, decreased endurance, decreased mobility, decreased ROM, decreased strength, increased fascial restrictions, impaired perceived functional ability, impaired flexibility, impaired tone, impaired UE functional use, improper body mechanics, postural dysfunction, and pain.   ACTIVITY LIMITATIONS: carrying, lifting, sleeping, transfers, bathing, toileting, dressing, self feeding, reach over head, and hygiene/grooming  PARTICIPATION LIMITATIONS: meal prep, cleaning, driving, shopping, community activity, occupation, yard work, church, and farming  PERSONAL FACTORS: Age, Past/current experiences, Sex, Time since onset of injury/illness/exacerbation, and 1-2 comorbidities: simultaneous wrist fx, hx of cancer  are also affecting patient's functional outcome.   REHAB POTENTIAL: Good  CLINICAL DECISION MAKING: Evolving/moderate complexity  EVALUATION COMPLEXITY: Moderate   GOALS: Goals reviewed with patient? Yes  SHORT TERM GOALS: Target date: 08/25/22  Pt will be independent with HEP in order to improve strength and mobility in order to improve function  at home and work.  Baseline:07/28/22 Goal status: INITIAL    LONG TERM GOALS: Target date: 07/28/22  Patient will increase FOTO score to 62 to demonstrate predicted increase in functional mobility to complete ADLs Baseline: 32 Goal status: INITIAL   2.  Pt will demonstrated R shoulder gross AROM of R shoulder WNL in order to complete ADLS Baseline: see eval Goal status: INITIAL  3.  Pt will demonstrated R shoulder and periscapular gross MMT of 4+/5 in order to complete ADLS Baseline: see eval Goal status: INITIAL  4.  Pt will decrease worst pain as reported on NPRS by at least 3 points in order to  demonstrate clinically significant reduction in pain.  Baseline: 9/10 Goal status: INITIAL     PLAN:  PT FREQUENCY: 1-2x/week  PT DURATION: 8 weeks  PLANNED INTERVENTIONS: Therapeutic exercises, Therapeutic activity, Neuromuscular re-education, Balance training, Gait training, Patient/Family education, Self Care, Joint mobilization, DME instructions, Dry Needling, Spinal manipulation, Spinal mobilization, Cryotherapy, Moist heat, scar mobilization, Splintting, Taping, Traction, Ultrasound, Biofeedback, Manual therapy, and Re-evaluation  PLAN FOR NEXT SESSION: FU on ortho visit and updates, continue with ROM and strength as permitted    Durwin Reges DPT  Durwin Reges, PT 08/18/2022, 8:46 AM

## 2022-08-17 NOTE — Therapy (Signed)
[facility] [address] [place], [address] Phone: [phone]   Fax:  [phone]  Occupational Therapy Treatment/10th visit  Patient Details  Name: Alice Taylor MRN: [id number] Date of Birth: 1961/07/19 Referring Provider (OT): [place] PA   Encounter Date: 08/17/2022   OT End of Session - 08/17/22 1249     Visit Number 10    Number of Visits 13    Date for OT Re-Evaluation 09/11/22    OT Start Time 1203    OT Stop Time 1230    OT Time Calculation (min) 27 min    Activity Tolerance Patient tolerated treatment well    Behavior During Therapy [facility] for tasks assessed/performed             Past Medical History:  Diagnosis Date   Cancer ([place]) 1970   Skin   GERD (gastroesophageal reflux disease)    History of kidney stones    PONV (postoperative nausea and vomiting)    x1 after orif ankle surgery    Past Surgical History:  Procedure Laterality Date   APPENDECTOMY     KNEE ARTHROSCOPY Right    KNEE ARTHROSCOPY Left 04/24/2018   Procedure: ARTHROSCOPY KNEE, PARTIAL MEDIAL MENISCECTOMY;  Surgeon: Dereck Leep, MD;  Location: [facility] ORS;  Service: Orthopedics;  Laterality: Left;   ORIF ANKLE FRACTURE Right 01/17/2019   Procedure: OPEN REDUCTION INTERNAL FIXATION (ORIF) ANKLE FRACTURE;  Surgeon: Hessie Knows, MD;  Location: [facility] ORS;  Service: Orthopedics;  Laterality: Right;   ORIF WRIST FRACTURE Right 02/21/2022   Procedure: OPEN REDUCTION INTERNAL FIXATION (ORIF) OR RIGHT DISTAL RADIUS FRACTURE.;  Surgeon: Corky Mull, MD;  Location: [facility] ORS;  Service: Orthopedics;  Laterality: Right;    There were no vitals filed for this visit.   Subjective Assessment - 08/17/22 1248     Subjective  I am using my paraffin in the morning.  My hand and wrist does not feel as tight anymore.  Did see the doctor and he left my restrictions.  I started driving.    Pertinent History Pt last seen by PA at Ortho on  04/03/22 - Alice Taylor is a 61 y.o. female who presents for follow-up now 6 weeks status post an open reduction and internal fixation of her right distal radius fracture with an ulnar styloid fracture. Overall, the patient feels that she is doing well. She still notes some discomfort in her wrist which she rates at 4/10 on today's visit, and for which she has been taking Tylenol and applying ice as necessary with temporary partial relief of her symptoms. She been wearing her Velcro wrist immobilizer on a regular basis, removing it for bathing purposes and for exercises. She still notes moderate stiffness in the wrist and fingers, as well as some swelling, but denies any reinjury to the hand or wrist. She also denies any fevers or chills, and denies any numbness or paresthesias to her fingers.Xray showed good healing at radius and ulnar and refer to therapy - pt has been doing PT at [facility] 3 x wk per pt the last month - pt refer to OT for digits stiffness and possible splint. 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  As well as painting and being able to decorate at different events.    Currently in Pain? No/denies                [facility] OT Assessment - 08/17/22 0001       AROM   Right Forearm Pronation 90 Degrees    Right Forearm Supination 85 Degrees    Right Wrist Extension 65 Degrees    Right Wrist Flexion 75 Degrees    Right Wrist Radial Deviation 25 Degrees    Right Wrist Ulnar Deviation 20 Degrees      Strength   Right Hand Grip (lbs) 21    Right Hand Lateral Pinch 8 lbs    Right Hand 3 Point Pinch 11 lbs    Left Hand Grip (lbs) 60    Left Hand Lateral Pinch 15 lbs    Left Hand 3 Point  Pinch 14 lbs      Right Hand AROM   R Thumb MCP 0-60 60 Degrees    R Thumb IP 0-80 40 Degrees    R Thumb Radial ABduction/ADduction 0-55 50    R Thumb Palmar ABduction/ADduction 0-45 55    R Index  MCP 0-90 90 Degrees    R Index PIP 0-100 90 Degrees    R Long  MCP 0-90 90 Degrees    R Long PIP 0-100 95 Degrees    R Ring  MCP 0-90 90 Degrees    R Ring PIP 0-100 100 Degrees    R Little  MCP 0-90 90 Degrees    R Little PIP 0-100 100 Degrees               Patient reports she was able to use her paraffin now last 2 wks in am - but did not do in the evening - pt encouraged to use it at nighttime for achiness and pain.  And feel like hand and wrist not as tight since last week   Wrist active range of motion as well as supination improving consistently.   As well as digits flexion and thumb.   See flowsheet -patient to continue with endrange supination as well as wrist flexion extension stretches and thumb IP flexion.   After her range of motion she can initiate today 1 pound weight for supination and pronation as well as wrist in all planes 10 reps to 12 reps 2 times a day  As well as teal medium putty for gripping, lateral and 3-point pinch 12 reps to 15 twice a day.   If pain-free can increase to a second and the third set in the next 10 to 12 days and follow-up with me in 2 weeks.                  OT Education - 08/17/22 1249     Education Details Home exercises for wrist and hand review    Person(s) Educated Patient    Methods Explanation;Demonstration;Tactile cues;Verbal cues;Handout    Comprehension Verbal cues required;Returned demonstration;Verbalized understanding              OT Short Term Goals - 07/31/22 1251       OT SHORT TERM GOAL #1   Title Patient to be independent in home program for active assisted range of motion for wrist and forearm in all planes to show progress to be able to upgrade  patient to 2 pound weight.    Status Deferred                OT Long Term Goals - 07/31/22 1252       OT LONG TERM GOAL #1   Title Right wrist and forearm active range of motion increased to within functional limits for patient to turn a doorknob as well as bath and dress using R hand more than 50%    Baseline sup 60; decrease wrist AROM compare to prior to last fall - see flowsheet    Time 6    Period Weeks    Status On-going    Target Date 09/11/22      OT LONG TERM GOAL #2   Title Right composite fist improved for patient to be able to touch palm to cut food do hair    Baseline End range composite flexion decrease -and end range PIP extention limited -    Time 6    Period Weeks    Status On-going    Target Date 09/11/22      OT LONG TERM GOAL #3   Title Right grip and prehension strength improved to more than 75% compared to left hand to be able to carry more than 5 pounds as well as 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    Baseline no strenghtening since last fall - 6 wks ago - -limited  in composite fist    Time 6    Period Weeks    Status On-going    Target Date 09/11/22                   Plan - 08/17/22 1250     Clinical Impression Statement Patient was seen by this OT after her diagnosis of postop Colles' fracture R distal radius  ORIF on 02/21/22.  Pt made great progress at that time in about 6 visits but then had had another fall early Oct that resulted in a proximal humerus nondisplaced fracture.  SHe was in sling for while and  started working with PT for shoulder - and return to OT with increase stiffness in hand, wrist and forearm but made great progress in ROM - and restriction was lift - pt can start back doing some 1 lbs for wrist and forearm -and putty for grip and prehension - pt to upgrade the next week sets and reps. Will follow up in 2 wks. Cont to be limited  in  functional use of R dominant hand in ADL's and IADL"s    OT Occupational Profile and History Problem Focused  Assessment - Including review of records relating to presenting problem    Occupational performance deficits (Please refer to evaluation for details): ADL's;IADL's;Work;Play;Leisure;Social Participation    Body Structure / Function / Physical Skills ADL;Strength;Dexterity;Pain;Edema;UE functional use;IADL;ROM;Scar mobility;Flexibility    Rehab Potential Good    Clinical Decision Making Limited treatment options, no task modification necessary    Comorbidities Affecting Occupational Performance: None    Modification or Assistance to Complete Evaluation  No modification of tasks or assist necessary to complete eval    OT Frequency Biweekly    OT Duration 4 weeks    OT Treatment/Interventions Self-care/ADL training;Fluidtherapy;Moist Heat;Contrast Bath;Paraffin;Manual Therapy;Patient/family education;Passive range of motion;Scar mobilization;Therapeutic exercise;Splinting;DME and/or AE instruction    Consulted and Agree with Plan of Care Patient             Patient will benefit from skilled therapeutic intervention in  order to improve the following deficits and impairments:   Body Structure / Function / Physical Skills: ADL, Strength, Dexterity, Pain, Edema, UE functional use, IADL, ROM, Scar mobility, Flexibility       Visit Diagnosis: Muscle weakness (generalized)  Stiffness of right hand, not elsewhere classified  Stiffness of right wrist, not elsewhere classified  Pain in right hand    Problem List Patient Active Problem List   Diagnosis Date Noted   Closed right trimalleolar fracture, initial encounter 01/17/2019    Rosalyn Gess, OTR/L,CLT 08/17/2022, 12:53 PM  [facility] [address], [place], [address] Phone: [phone]   Fax:  [phone]  Name: Alice Taylor MRN: [id number] Date of Birth: [birth year]

## 2022-08-18 ENCOUNTER — Encounter: Payer: Self-pay | Admitting: Physical Therapy

## 2022-08-21 ENCOUNTER — Encounter: Payer: Self-pay | Admitting: Physical Therapy

## 2022-08-21 ENCOUNTER — Ambulatory Visit: Payer: PRIVATE HEALTH INSURANCE | Admitting: Physical Therapy

## 2022-08-21 DIAGNOSIS — M25511 Pain in right shoulder: Secondary | ICD-10-CM

## 2022-08-21 DIAGNOSIS — M6281 Muscle weakness (generalized): Secondary | ICD-10-CM | POA: Diagnosis not present

## 2022-08-21 NOTE — Therapy (Signed)
OUTPATIENT PHYSICAL THERAPY SHOULDER TREATMENT   Patient Name: Alice Taylor MRN: [id number] DOB:1961-06-21, 61 y.o., female Today's Date: 08/21/2022  END OF SESSION:  PT End of Session - 08/21/22 1354     Visit Number 7    Number of Visits 17    Date for PT Re-Evaluation 09/29/22    Authorization Type [employer]    Authorization Time Period 07/28/22-10/11/21    Authorization - Visit Number 7    Authorization - Number of Visits 10    Progress Note Due on Visit 10    PT Start Time 3299    PT Stop Time 1430    PT Time Calculation (min) 38 min    Activity Tolerance Patient tolerated treatment well;No increased pain    Behavior During Therapy WFL for tasks assessed/performed                  Past Medical History:  Diagnosis Date   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   GERD (gastroesophageal reflux disease)    History of kidney stones    PONV (postoperative nausea and vomiting)    x1 after orif ankle surgery   Past Surgical History:  Procedure Laterality Date   APPENDECTOMY     KNEE ARTHROSCOPY Right    KNEE ARTHROSCOPY Left 04/24/2018   Procedure: ARTHROSCOPY KNEE, PARTIAL MEDIAL MENISCECTOMY;  Surgeon: Dereck Leep, MD;  Location: [facility] ORS;  Service: Orthopedics;  Laterality: Left;   ORIF ANKLE FRACTURE Right 01/17/2019   Procedure: OPEN REDUCTION INTERNAL FIXATION (ORIF) ANKLE FRACTURE;  Surgeon: Hessie Knows, MD;  Location: [facility] ORS;  Service: Orthopedics;  Laterality: Right;   ORIF WRIST FRACTURE Right 02/21/2022   Procedure: OPEN REDUCTION INTERNAL FIXATION (ORIF) OR RIGHT DISTAL RADIUS FRACTURE.;  Surgeon: Corky Mull, MD;  Location: [facility] OR[facility];  Service: Orthopedics;  Laterality: Right;   Patient Active Problem List   Diagnosis Date Noted   Closed right trimalleolar fracture, initial encounter 01/17/2019    PCP: Alvera Singh FNP  REFERRING PROVIDER: Milagros Evener  REFERRING DIAG: R prox humerus fracture 06/19/22 (closed)  THERAPY DIAG:  Acute pain of  right shoulder  Rationale for Evaluation and Treatment: Rehabilitation  ONSET DATE: 06/19/22  SUBJECTIVE:                                                                                                                                                                                      SUBJECTIVE STATEMENT: Pt reports feeling sore appropriately after dry needling, felt to be more mobile in shoulder. Pain is high today for no clear reason.  She sees [place] on Wednesday, will ask about updates to 90 degree restriction  in shoulder.   PERTINENT HISTORY: Pt is a 61 year old female presenting s/p non-surgical closed proximal humerus fracture 06/19/22 resulting from fall. Patient was in immobile sling until 07/10/22. Since 07/10/22 has been letting arm dangle, has been moving elbow and wrist, has not completed overhead lifting. Reports current and best pain level 1/10; worst pain 9/10. Reports pain is worse at night, is sleeping sitting up. Pt is R handed. Lives with husband who is assisting her in dressing and bathing (does not have a shower seat or grab bars. Is using claw foot tub with husbands help). Husband is driving at this time. She does feed herself and brush her teeth with her left hand modI, reporting this is difficult. 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 Farming is labor intensive, and she needs to be able to climb to top of the barn to throw down hay, drive the tractor and complete farm chores. Pt denies N/V, B&B changes, unexplained weight fluctuation, saddle paresthesia, fever, night sweats, or unrelenting night pain at this time.   PAIN:  Are you having pain? Yes; 5/10 Rt anterior  shoulder; 2/10 Rt biceps area pain   PRECAUTIONS: Other: no ROM over shoulder height per Cameron Proud PA 07/17/22   WEIGHT BEARING RESTRICTIONS: No  FALLS:  Has patient fallen in last 6 months? Yes. Number of falls 2   OCCUPATION: Cattle farmer  PLOF: Independent  PATIENT GOALS:Be able to return to farming chores  NEXT MD VISIT: Dec 6th 2023 with Kieth Brightly TREATMENT:                                                                                                                                         -UBE 64mn fwd; 245m bwd  for gentle strengthening and motion - Supine AAROM overhead flex with dowel with 3# AW x12 - Sidelying 1# DB ER 2x 10 with cuing for technique with good carry over  - Short lever flexion seated 2x 10 - Short lever abd seated 2x 10   Manual STM with trigger point release to R UT and pec minor; bicep, brachioradialis, pronator teres Following: Dry Needling: (1/2/2) 3044m25 needles placed along the R UT, Biceps and pronator teres to decrease increased muscular spasms and trigger points with the patient positioned in supine. Patient was educated on risks and benefits of therapy and verbally consents to PT.  PROM all directions with pain as guide for stopping point  Home exercises:  PT reviewed the following HEP with patient with patient able to demonstrate a set  of the following with min cuing for correction needed. PT educated patient on parameters of therex (how/when to inc/decrease intensity, frequency, rep/set range, stretch hold time, and purpose of therex) with verbalized understanding.   Access Code: [email or web address] - Supine Pec Stretch  - 3 x daily - 7 x weekly - 30-60sec hold - Seated Upper Trapezius Stretch  - 3 x daily - 7 x weekly - 30-60secc hold - Supine Shoulder Flexion Extension AAROM with Dowel  - 2-3 x daily - 7 x weekly - 12-20 reps - Supine Shoulder Abduction AAROM with Dowel  - 2-3 x daily - 7 x weekly - 12-20 reps - Supine Shoulder  External Rotation in 45 Degrees Abduction AAROM with Dowel  - 2-3 x daily - 7 x weekly - 12-20 reps - Supine Shoulder Flexion Extension Full Range AROM  - 1 x daily - 1-2 x weekly - 2-3 sets - 5-10 reps - Sidelying Shoulder Abduction Palm Forward  - 1 x daily - 1-2 x weekly - 2-3 sets - 5-10 reps - Sidelying Shoulder External Rotation AROM  - 1 x daily - 1-2 x weekly - 2-3 sets - 5-10 reps   PATIENT EDUCATION: Education details: Patient was educated on diagnosis, anatomy and pathology involved, prognosis, role of PT, and was given an HEP, demonstrating exercise with proper form following verbal and tactile cues, and was given a paper hand out to continue exercise at home. Pt was educated on and agreed to plan of care. Person educated: Patient Education method: Explanation, Demonstration, Verbal cues, and Handouts Education comprehension: verbalized understanding and returned demonstration  HOME EXERCISE PROGRAM: [id number]  ASSESSMENT:  CLINICAL IMPRESSION: PT continued therex progression for increased shoulder mobility and strength with success. Pt with most difficulty with overhead motion (>90d) passive and actively. PT encouraged patient to focus on overhead AAROM with dowel with understanding. Pt is able to comply with all cuing for proper technique of therex with good effort throughout session. Will benefit from skilled PT to address above deficits and promote optimal return to PLOF.   OBJECTIVE IMPAIRMENTS: decreased activity tolerance, decreased coordination, decreased endurance, decreased mobility, decreased ROM, decreased strength, increased fascial restrictions, impaired perceived functional ability, impaired flexibility, impaired tone, impaired UE functional use, improper body mechanics, postural dysfunction, and pain.   ACTIVITY LIMITATIONS: carrying, lifting, sleeping, transfers, bathing, toileting, dressing, self feeding, reach over head, and hygiene/grooming  PARTICIPATION  LIMITATIONS: meal prep, cleaning, driving, shopping, community activity, occupation, yard work, church, and farming  PERSONAL FACTORS: Age, Past/current experiences, Sex, Time since onset of injury/illness/exacerbation, and 1-2 comorbidities: simultaneous wrist fx, hx of cancer  are also affecting patient's functional outcome.   REHAB POTENTIAL: Good  CLINICAL DECISION MAKING: Evolving/moderate complexity  EVALUATION COMPLEXITY: Moderate   GOALS: Goals reviewed with patient? Yes  SHORT TERM GOALS: Target date: 08/25/22  Pt will be independent with HEP in order to improve strength and mobility in order to improve function at home and work.  Baseline:07/28/22 Goal status: INITIAL    LONG TERM GOALS: Target date: 07/28/22  Patient will increase FOTO score to 62 to demonstrate predicted increase in functional mobility to complete ADLs Baseline: 32 Goal status: INITIAL   2.  Pt will demonstrated R shoulder gross AROM of R shoulder WNL in order to complete ADLS Baseline: see eval Goal status: INITIAL  3.  Pt will demonstrated R shoulder and periscapular gross MMT of 4+/5 in order to complete ADLS Baseline: see eval Goal status: INITIAL  4.  Pt will  decrease worst pain as reported on NPRS by at least 3 points in order to demonstrate clinically significant reduction in pain.  Baseline: 9/10 Goal status: INITIAL     PLAN:  PT FREQUENCY: 1-2x/week  PT DURATION: 8 weeks  PLANNED INTERVENTIONS: Therapeutic exercises, Therapeutic activity, Neuromuscular re-education, Balance training, Gait training, Patient/Family education, Self Care, Joint mobilization, DME instructions, Dry Needling, Spinal manipulation, Spinal mobilization, Cryotherapy, Moist heat, scar mobilization, Splintting, Taping, Traction, Ultrasound, Biofeedback, Manual therapy, and Re-evaluation  PLAN FOR NEXT SESSION: FU on ortho visit and updates, continue with ROM and strength as permitted    Durwin Reges  DPT  Durwin Reges, PT 08/21/2022, 2:39 PM

## 2022-08-23 ENCOUNTER — Ambulatory Visit: Payer: PRIVATE HEALTH INSURANCE | Admitting: Physical Therapy

## 2022-08-24 ENCOUNTER — Ambulatory Visit: Payer: PRIVATE HEALTH INSURANCE | Admitting: Physical Therapy

## 2022-08-24 ENCOUNTER — Encounter: Payer: Self-pay | Admitting: Physical Therapy

## 2022-08-24 DIAGNOSIS — M25511 Pain in right shoulder: Secondary | ICD-10-CM

## 2022-08-24 DIAGNOSIS — M6281 Muscle weakness (generalized): Secondary | ICD-10-CM | POA: Diagnosis not present

## 2022-08-24 NOTE — Therapy (Incomplete)
OUTPATIENT PHYSICAL THERAPY SHOULDER TREATMENT   Patient Name: Alice Taylor MRN: [id number] DOB:27-May-1961, 61 y.o., female Today's Date: 08/24/2022  END OF SESSION:  PT End of Session - 08/24/22 1204     Visit Number 8    Number of Visits 17    Date for PT Re-Evaluation 09/29/22    Authorization Type [employer]    Authorization Time Period 07/28/22-10/11/21    Authorization - Visit Number 8    Authorization - Number of Visits 10    Progress Note Due on Visit 10    PT Start Time 1200    PT Stop Time 1238    PT Time Calculation (min) 38 min    Activity Tolerance Patient tolerated treatment well;No increased pain    Behavior During Therapy WFL for tasks assessed/performed                  Past Medical History:  Diagnosis Date   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   GERD (gastroesophageal reflux disease)    History of kidney stones    PONV (postoperative nausea and vomiting)    x1 after orif ankle surgery   Past Surgical History:  Procedure Laterality Date   APPENDECTOMY     KNEE ARTHROSCOPY Right    KNEE ARTHROSCOPY Left 04/24/2018   Procedure: ARTHROSCOPY KNEE, PARTIAL MEDIAL MENISCECTOMY;  Surgeon: Dereck Leep, MD;  Location: [facility];  Service: Orthopedics;  Laterality: Left;   ORIF ANKLE FRACTURE Right 01/17/2019   Procedure: OPEN REDUCTION INTERNAL FIXATION (ORIF) ANKLE FRACTURE;  Surgeon: Hessie Knows, MD;  Location: [facility];  Service: Orthopedics;  Laterality: Right;   ORIF WRIST FRACTURE Right 02/21/2022   Procedure: OPEN REDUCTION INTERNAL FIXATION (ORIF) OR RIGHT DISTAL RADIUS FRACTURE.;  Surgeon: Corky Mull, MD;  Location: [facility];  Service: Orthopedics;  Laterality: Right;   Patient Active Problem List   Diagnosis Date Noted   Closed right trimalleolar fracture, initial encounter 01/17/2019    PCP: Alvera Singh FNP  REFERRING PROVIDER: Milagros Evener  REFERRING DIAG: R prox humerus fracture 06/19/22 (closed)  THERAPY DIAG:  Acute pain of  right shoulder  Muscle weakness (generalized)  Rationale for Evaluation and Treatment: Rehabilitation  ONSET DATE: 06/19/22  SUBJECTIVE:                                                                                                                                                                                      SUBJECTIVE STATEMENT: Pt reports feeling sore appropriately after dry needling, felt to be more mobile in shoulder. Pain is high today for no clear reason.  She sees [place] on Wednesday, will ask about updates  to 90 degree restriction in shoulder.   PERTINENT HISTORY: Pt is a 61 year old female presenting s/p non-surgical closed proximal humerus fracture 06/19/22 resulting from fall. Patient was in immobile sling until 07/10/22. Since 07/10/22 has been letting arm dangle, has been moving elbow and wrist, has not completed overhead lifting. Reports current and best pain level 1/10; worst pain 9/10. Reports pain is worse at night, is sleeping sitting up. Pt is R handed. Lives with husband who is assisting her in dressing and bathing (does not have a shower seat or grab bars. Is using claw foot tub with husbands help). Husband is driving at this time. She does feed herself and brush her teeth with her left hand modI, reporting this is difficult. 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 Farming is labor intensive, and she needs to be able to climb to top of the barn to throw down hay, drive the tractor and complete farm chores. Pt denies N/V, B&B changes, unexplained weight fluctuation, saddle paresthesia, fever, night sweats, or unrelenting night pain at this time.   PAIN:  Are you  having pain? Yes; 5/10 Rt anterior shoulder; 2/10 Rt biceps area pain   PRECAUTIONS: Other: no ROM over shoulder height per Cameron Proud PA 07/17/22   WEIGHT BEARING RESTRICTIONS: No  FALLS:  Has patient fallen in last 6 months? Yes. Number of falls 2   OCCUPATION: Cattle farmer  PLOF: Independent  PATIENT GOALS:Be able to return to farming chores  NEXT MD VISIT: Dec 6th 2023 with Kieth Brightly TREATMENT:                                                                                                                                         -UBE 29mn fwd; 275m bwd  for gentle strengthening and motion - Supine AAROM overhead flex with dowel with 3# AW x12 - Sidelying 1# DB ER 2x 10 with cuing for technique with good carry over  - Short lever flexion seated 2x 10 - Short lever abd seated 2x 10   Manual STM with trigger point release to R UT and pec minor; bicep, brachioradialis, pronator teres Following: Dry Needling: (1/2/2) 304m25 needles placed along the R UT, Biceps and pronator teres to decrease increased muscular spasms and trigger points with the patient positioned in supine. Patient was educated on risks and benefits of therapy and verbally consents to PT.  PROM all directions with pain as guide for stopping point  Home exercises:  PT reviewed the following HEP with patient with patient able  to demonstrate a set of the following with min cuing for correction needed. PT educated patient on parameters of therex (how/when to inc/decrease intensity, frequency, rep/set range, stretch hold time, and purpose of therex) with verbalized understanding.   Access Code: [email or web address] - Supine Pec Stretch  - 3 x daily - 7 x weekly - 30-60sec hold - Seated Upper Trapezius Stretch  - 3 x daily - 7 x weekly - 30-60secc hold - Supine Shoulder Flexion Extension AAROM with Dowel  - 2-3 x daily - 7 x weekly - 12-20 reps - Supine Shoulder Abduction AAROM with Dowel  - 2-3 x daily - 7 x weekly  - 12-20 reps - Supine Shoulder External Rotation in 45 Degrees Abduction AAROM with Dowel  - 2-3 x daily - 7 x weekly - 12-20 reps - Supine Shoulder Flexion Extension Full Range AROM  - 1 x daily - 1-2 x weekly - 2-3 sets - 5-10 reps - Sidelying Shoulder Abduction Palm Forward  - 1 x daily - 1-2 x weekly - 2-3 sets - 5-10 reps - Sidelying Shoulder External Rotation AROM  - 1 x daily - 1-2 x weekly - 2-3 sets - 5-10 reps   PATIENT EDUCATION: Education details: Patient was educated on diagnosis, anatomy and pathology involved, prognosis, role of PT, and was given an HEP, demonstrating exercise with proper form following verbal and tactile cues, and was given a paper hand out to continue exercise at home. Pt was educated on and agreed to plan of care. Person educated: Patient Education method: Explanation, Demonstration, Verbal cues, and Handouts Education comprehension: verbalized understanding and returned demonstration  HOME EXERCISE PROGRAM: [email or web address]  ASSESSMENT:  CLINICAL IMPRESSION: PT continued therex progression for increased shoulder mobility and strength with success. Pt with most difficulty with overhead motion (>90d) passive and actively. PT encouraged patient to focus on overhead AAROM with dowel with understanding. Pt is able to comply with all cuing for proper technique of therex with good effort throughout session. Will benefit from skilled PT to address above deficits and promote optimal return to PLOF.   OBJECTIVE IMPAIRMENTS: decreased activity tolerance, decreased coordination, decreased endurance, decreased mobility, decreased ROM, decreased strength, increased fascial restrictions, impaired perceived functional ability, impaired flexibility, impaired tone, impaired UE functional use, improper body mechanics, postural dysfunction, and pain.   ACTIVITY LIMITATIONS: carrying, lifting, sleeping, transfers, bathing, toileting, dressing, self feeding, reach over head, and  hygiene/grooming  PARTICIPATION LIMITATIONS: meal prep, cleaning, driving, shopping, community activity, occupation, yard work, church, and farming  PERSONAL FACTORS: Age, Past/current experiences, Sex, Time since onset of injury/illness/exacerbation, and 1-2 comorbidities: simultaneous wrist fx, hx of cancer  are also affecting patient's functional outcome.   REHAB POTENTIAL: Good  CLINICAL DECISION MAKING: Evolving/moderate complexity  EVALUATION COMPLEXITY: Moderate   GOALS: Goals reviewed with patient? Yes  SHORT TERM GOALS: Target date: 08/25/22  Pt will be independent with HEP in order to improve strength and mobility in order to improve function at home and work.  Baseline:07/28/22 Goal status: INITIAL    LONG TERM GOALS: Target date: 07/28/22  Patient will increase FOTO score to 62 to demonstrate predicted increase in functional mobility to complete ADLs Baseline: 32 Goal status: INITIAL   2.  Pt will demonstrated R shoulder gross AROM of R shoulder WNL in order to complete ADLS Baseline: see eval Goal status: INITIAL  3.  Pt will demonstrated R shoulder and periscapular gross MMT of 4+/5 in order to complete ADLS Baseline: see eval Goal status: INITIAL  4.  Pt will decrease worst pain as reported on NPRS by at least 3 points in order to demonstrate clinically significant reduction in pain.  Baseline: 9/10 Goal status: INITIAL     PLAN:  PT FREQUENCY: 1-2x/week  PT DURATION: 8 weeks  PLANNED INTERVENTIONS: Therapeutic exercises, Therapeutic activity, Neuromuscular re-education, Balance training, Gait training, Patient/Family education, Self Care, Joint mobilization, DME instructions, Dry Needling, Spinal manipulation, Spinal mobilization, Cryotherapy, Moist heat, scar mobilization, Splintting, Taping, Traction, Ultrasound, Biofeedback, Manual therapy, and Re-evaluation  PLAN FOR NEXT SESSION: FU on ortho visit and updates, continue with ROM and strength  as permitted    Durwin Reges DPT  Durwin Reges, PT 08/24/2022, 12:05 PM

## 2022-08-24 NOTE — Therapy (Signed)
OUTPATIENT PHYSICAL THERAPY SHOULDER TREATMENT   Patient Name: Alice Taylor MRN: [id number] DOB:Mar 25, 1961, 61 y.o., female Today's Date: 08/25/2022  END OF SESSION:  PT End of Session - 08/24/22 1204     Visit Number 8    Number of Visits 17    Date for PT Re-Evaluation 09/29/22    Authorization Type [employer]    Authorization Time Period 07/28/22-10/11/21    Authorization - Visit Number 8    Authorization - Number of Visits 10    Progress Note Due on Visit 10    PT Start Time 1200    PT Stop Time 1230    PT Time Calculation (min) 30 min    Activity Tolerance Patient tolerated treatment well;No increased pain    Behavior During Therapy WFL for tasks assessed/performed                  Past Medical History:  Diagnosis Date   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   GERD (gastroesophageal reflux disease)    History of kidney stones    PONV (postoperative nausea and vomiting)    x1 after orif ankle surgery   Past Surgical History:  Procedure Laterality Date   APPENDECTOMY     KNEE ARTHROSCOPY Right    KNEE ARTHROSCOPY Left 04/24/2018   Procedure: ARTHROSCOPY KNEE, PARTIAL MEDIAL MENISCECTOMY;  Surgeon: Dereck Leep, MD;  Location: [facility];  Service: Orthopedics;  Laterality: Left;   ORIF ANKLE FRACTURE Right 01/17/2019   Procedure: OPEN REDUCTION INTERNAL FIXATION (ORIF) ANKLE FRACTURE;  Surgeon: Hessie Knows, MD;  Location: [facility];  Service: Orthopedics;  Laterality: Right;   ORIF WRIST FRACTURE Right 02/21/2022   Procedure: OPEN REDUCTION INTERNAL FIXATION (ORIF) OR RIGHT DISTAL RADIUS FRACTURE.;  Surgeon: Corky Mull, MD;  Location: [facility];  Service: Orthopedics;  Laterality: Right;   Patient Active Problem List   Diagnosis Date Noted   Closed right trimalleolar fracture, initial encounter 01/17/2019    PCP: Alvera Singh FNP  REFERRING PROVIDER: Milagros Evener  REFERRING DIAG: R prox humerus fracture 06/19/22 (closed)  THERAPY DIAG:  Acute pain of  right shoulder  Muscle weakness (generalized)  Rationale for Evaluation and Treatment: Rehabilitation  ONSET DATE: 06/19/22  SUBJECTIVE:                                                                                                                                                                                      SUBJECTIVE STATEMENT: Pt reports feeling sore appropriately after dry needling, felt to be more mobile in shoulder. Pain is high today for no clear reason.  She sees [place] on Wednesday, will ask about updates  to 90 degree restriction in shoulder.   PERTINENT HISTORY: Pt is a 61 year old female presenting s/p non-surgical closed proximal humerus fracture 06/19/22 resulting from fall. Patient was in immobile sling until 07/10/22. Since 07/10/22 has been letting arm dangle, has been moving elbow and wrist, has not completed overhead lifting. Reports current and best pain level 1/10; worst pain 9/10. Reports pain is worse at night, is sleeping sitting up. Pt is R handed. Lives with husband who is assisting her in dressing and bathing (does not have a shower seat or grab bars. Is using claw foot tub with husbands help). Husband is driving at this time. She does feed herself and brush her teeth with her left hand modI, reporting this is difficult. 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 Farming is labor intensive, and she needs to be able to climb to top of the barn to throw down hay, drive the tractor and complete farm chores. Pt denies N/V, B&B changes, unexplained weight fluctuation, saddle paresthesia, fever, night sweats, or unrelenting night pain at this time.   PAIN:  Are you  having pain? Yes; 5/10 Rt anterior shoulder; 2/10 Rt biceps area pain   PRECAUTIONS: Other: no ROM over shoulder height per Cameron Proud PA 07/17/22   WEIGHT BEARING RESTRICTIONS: No  FALLS:  Has patient fallen in last 6 months? Yes. Number of falls 2   OCCUPATION: Cattle farmer  PLOF: Independent  PATIENT GOALS:Be able to return to farming chores  NEXT MD VISIT: Dec 6th 2023 with Kieth Brightly TREATMENT:                                                                                                                                         -UBE 10mn fwd; 249m bwd  for gentle strengthening and motion - Supine AAROM overhead flex with dowel x12 - Supine AAROM abd dowel  - Short lever flexion seated 2x 10 - rotation stretch on wall x12 3sec hold   Manual STM with trigger point release to R UT and pec minor; bicep, brachioradialis, pronator teres Following: Dry Needling: (1/1) 3060m25 needles placed along the Biceps and pronator teres to decrease increased muscular spasms and trigger points with the patient positioned in supine. Patient was educated on risks and benefits of therapy and verbally consents to PT.  PROM all directions with pain as guide for stopping point  Home exercises:  PT reviewed the following HEP with patient with patient able to demonstrate a set of the following with min cuing for correction needed. PT  educated patient on parameters of therex (how/when to inc/decrease intensity, frequency, rep/set range, stretch hold time, and purpose of therex) with verbalized understanding.   Access Code: [email or web address] - Supine Pec Stretch  - 3 x daily - 7 x weekly - 30-60sec hold - Seated Upper Trapezius Stretch  - 3 x daily - 7 x weekly - 30-60secc hold - Supine Shoulder Flexion Extension AAROM with Dowel  - 2-3 x daily - 7 x weekly - 12-20 reps - Supine Shoulder Abduction AAROM with Dowel  - 2-3 x daily - 7 x weekly - 12-20 reps - Supine Shoulder External Rotation in 45  Degrees Abduction AAROM with Dowel  - 2-3 x daily - 7 x weekly - 12-20 reps - Supine Shoulder Flexion Extension Full Range AROM  - 1 x daily - 1-2 x weekly - 2-3 sets - 5-10 reps - Sidelying Shoulder Abduction Palm Forward  - 1 x daily - 1-2 x weekly - 2-3 sets - 5-10 reps - Sidelying Shoulder External Rotation AROM  - 1 x daily - 1-2 x weekly - 2-3 sets - 5-10 reps   PATIENT EDUCATION: Education details: Patient was educated on diagnosis, anatomy and pathology involved, prognosis, role of PT, and was given an HEP, demonstrating exercise with proper form following verbal and tactile cues, and was given a paper hand out to continue exercise at home. Pt was educated on and agreed to plan of care. Person educated: Patient Education method: Explanation, Demonstration, Verbal cues, and Handouts Education comprehension: verbalized understanding and returned demonstration  HOME EXERCISE PROGRAM: [email or web address]  ASSESSMENT:  CLINICAL IMPRESSION: PT continued therex progression for increased shoulder mobility and strength with success. Pt with most difficulty with overhead motion (>90d) passive and actively. PT encouraged patient to focus on overhead AAROM with dowel with understanding. Pt is able to comply with all cuing for proper technique of therex with good effort throughout session. Will benefit from skilled PT to address above deficits and promote optimal return to PLOF.   OBJECTIVE IMPAIRMENTS: decreased activity tolerance, decreased coordination, decreased endurance, decreased mobility, decreased ROM, decreased strength, increased fascial restrictions, impaired perceived functional ability, impaired flexibility, impaired tone, impaired UE functional use, improper body mechanics, postural dysfunction, and pain.   ACTIVITY LIMITATIONS: carrying, lifting, sleeping, transfers, bathing, toileting, dressing, self feeding, reach over head, and hygiene/grooming  PARTICIPATION LIMITATIONS: meal prep,  cleaning, driving, shopping, community activity, occupation, yard work, church, and farming  PERSONAL FACTORS: Age, Past/current experiences, Sex, Time since onset of injury/illness/exacerbation, and 1-2 comorbidities: simultaneous wrist fx, hx of cancer  are also affecting patient's functional outcome.   REHAB POTENTIAL: Good  CLINICAL DECISION MAKING: Evolving/moderate complexity  EVALUATION COMPLEXITY: Moderate   GOALS: Goals reviewed with patient? Yes  SHORT TERM GOALS: Target date: 08/25/22  Pt will be independent with HEP in order to improve strength and mobility in order to improve function at home and work.  Baseline:07/28/22 Goal status: INITIAL    LONG TERM GOALS: Target date: 07/28/22  Patient will increase FOTO score to 62 to demonstrate predicted increase in functional mobility to complete ADLs Baseline: 32 Goal status: INITIAL   2.  Pt will demonstrated R shoulder gross AROM of R shoulder WNL in order to complete ADLS Baseline: see eval Goal status: INITIAL  3.  Pt will demonstrated R shoulder and periscapular gross MMT of 4+/5 in order to complete ADLS Baseline: see eval Goal status: INITIAL  4.  Pt will decrease worst pain as reported on NPRS by at least  3 points in order to demonstrate clinically significant reduction in pain.  Baseline: 9/10 Goal status: INITIAL     PLAN:  PT FREQUENCY: 1-2x/week  PT DURATION: 8 weeks  PLANNED INTERVENTIONS: Therapeutic exercises, Therapeutic activity, Neuromuscular re-education, Balance training, Gait training, Patient/Family education, Self Care, Joint mobilization, DME instructions, Dry Needling, Spinal manipulation, Spinal mobilization, Cryotherapy, Moist heat, scar mobilization, Splintting, Taping, Traction, Ultrasound, Biofeedback, Manual therapy, and Re-evaluation  PLAN FOR NEXT SESSION: FU on ortho visit and updates, continue with ROM and strength as permitted    Durwin Reges DPT  Durwin Reges, PT 08/25/2022, 10:41 AM

## 2022-08-28 ENCOUNTER — Ambulatory Visit: Payer: PRIVATE HEALTH INSURANCE | Admitting: Occupational Therapy

## 2022-08-28 ENCOUNTER — Encounter: Payer: Self-pay | Admitting: Physical Therapy

## 2022-08-28 ENCOUNTER — Ambulatory Visit: Payer: PRIVATE HEALTH INSURANCE | Admitting: Physical Therapy

## 2022-08-28 DIAGNOSIS — M6281 Muscle weakness (generalized): Secondary | ICD-10-CM

## 2022-08-28 DIAGNOSIS — M25641 Stiffness of right hand, not elsewhere classified: Secondary | ICD-10-CM

## 2022-08-28 DIAGNOSIS — M25511 Pain in right shoulder: Secondary | ICD-10-CM

## 2022-08-28 DIAGNOSIS — M25631 Stiffness of right wrist, not elsewhere classified: Secondary | ICD-10-CM

## 2022-08-28 DIAGNOSIS — M79641 Pain in right hand: Secondary | ICD-10-CM

## 2022-08-28 NOTE — Therapy (Signed)
OUTPATIENT PHYSICAL THERAPY SHOULDER TREATMENT   Patient Name: Alice Taylor MRN: [id number] DOB:12-21-1960, 61 y.o., female Today's Date: 08/28/2022  END OF SESSION:  PT End of Session - 08/28/22 0921     Visit Number 9    Number of Visits 17    Date for PT Re-Evaluation 09/29/22    Authorization Type [employer]    Authorization Time Period 07/28/22-10/11/21    Authorization - Visit Number 9    Authorization - Number of Visits 10    Progress Note Due on Visit 10    PT Start Time 0922    PT Stop Time 1000    PT Time Calculation (min) 38 min    Activity Tolerance Patient tolerated treatment well;No increased pain    Behavior During Therapy WFL for tasks assessed/performed                   Past Medical History:  Diagnosis Date   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   GERD (gastroesophageal reflux disease)    History of kidney stones    PONV (postoperative nausea and vomiting)    x1 after orif ankle surgery   Past Surgical History:  Procedure Laterality Date   APPENDECTOMY     KNEE ARTHROSCOPY Right    KNEE ARTHROSCOPY Left 04/24/2018   Procedure: ARTHROSCOPY KNEE, PARTIAL MEDIAL MENISCECTOMY;  Surgeon: Dereck Leep, MD;  Location: [facility];  Service: Orthopedics;  Laterality: Left;   ORIF ANKLE FRACTURE Right 01/17/2019   Procedure: OPEN REDUCTION INTERNAL FIXATION (ORIF) ANKLE FRACTURE;  Surgeon: Hessie Knows, MD;  Location: [facility];  Service: Orthopedics;  Laterality: Right;   ORIF WRIST FRACTURE Right 02/21/2022   Procedure: OPEN REDUCTION INTERNAL FIXATION (ORIF) OR RIGHT DISTAL RADIUS FRACTURE.;  Surgeon: Corky Mull, MD;  Location: [facility];  Service: Orthopedics;  Laterality: Right;   Patient Active Problem List   Diagnosis Date Noted   Closed right trimalleolar fracture, initial encounter 01/17/2019    PCP: Alvera Singh FNP  REFERRING PROVIDER: Milagros Evener  REFERRING DIAG: R prox humerus fracture 06/19/22 (closed)  THERAPY DIAG:  Acute pain of  right shoulder  Muscle weakness (generalized)  Rationale for Evaluation and Treatment: Rehabilitation  ONSET DATE: 06/19/22  SUBJECTIVE:                                                                                                                                                                                      SUBJECTIVE STATEMENT: Pt reports feeling sore appropriately after dry needling, felt to be more mobile in shoulder. Pain is high today for no clear reason.  She sees [place] on Wednesday, will ask about  updates to 90 degree restriction in shoulder.   PERTINENT HISTORY: Pt is a 61 year old female presenting s/p non-surgical closed proximal humerus fracture 06/19/22 resulting from fall. Patient was in immobile sling until 07/10/22. Since 07/10/22 has been letting arm dangle, has been moving elbow and wrist, has not completed overhead lifting. Reports current and best pain level 1/10; worst pain 9/10. Reports pain is worse at night, is sleeping sitting up. Pt is R handed. Lives with husband who is assisting her in dressing and bathing (does not have a shower seat or grab bars. Is using claw foot tub with husbands help). Husband is driving at this time. She does feed herself and brush her teeth with her left hand modI, reporting this is difficult. 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 Farming is labor intensive, and she needs to be able to climb to top of the barn to throw down hay, drive the tractor and complete farm chores. Pt denies N/V, B&B changes, unexplained weight fluctuation, saddle paresthesia, fever, night sweats, or unrelenting night pain at this time.   PAIN:  Are you  having pain? Yes; 5/10 Rt anterior shoulder; 2/10 Rt biceps area pain   PRECAUTIONS: Other: no ROM over shoulder height per Cameron Proud PA 07/17/22   WEIGHT BEARING RESTRICTIONS: No  FALLS:  Has patient fallen in last 6 months? Yes. Number of falls 2   OCCUPATION: Cattle farmer  PLOF: Independent  PATIENT GOALS:Be able to return to farming chores  NEXT MD VISIT: Dec 6th 2023 with Kieth Brightly TREATMENT:                                                                                                                                         -UBE 56mn fwd; 213m bwd  for gentle strengthening and motion - Reclined lawn chair AAROM overhead flex with dowel; abd with dowel x12e [place] AAROM flex x12 - Short lever flexion seated x12 - Short lever abd seated x12 Towel across back x12   Manual STM with trigger point release to R UT, latissimus, bicep, brachioradialis, pronator teres Following: Dry Needling: (1/1/1) 3077m25 needles placed along the Biceps, UTand pronator teres to decrease increased muscular spasms and trigger points with the patient positioned in supine. Patient was educated on risks and benefits of therapy and verbally consents to PT.  PROM all directions with pain as guide for stopping point  Home exercises:  PT reviewed the following HEP with patient with patient able to demonstrate a set of the following with min cuing for  correction needed. PT educated patient on parameters of therex (how/when to inc/decrease intensity, frequency, rep/set range, stretch hold time, and purpose of therex) with verbalized understanding.   Access Code: [email or web address] - Supine Pec Stretch  - 3 x daily - 7 x weekly - 30-60sec hold - Seated Upper Trapezius Stretch  - 3 x daily - 7 x weekly - 30-60secc hold - Supine Shoulder Flexion Extension AAROM with Dowel  - 2-3 x daily - 7 x weekly - 12-20 reps - Supine Shoulder Abduction AAROM with Dowel  - 2-3 x daily - 7 x weekly - 12-20 reps -  Supine Shoulder External Rotation in 45 Degrees Abduction AAROM with Dowel  - 2-3 x daily - 7 x weekly - 12-20 reps - Supine Shoulder Flexion Extension Full Range AROM  - 1 x daily - 1-2 x weekly - 2-3 sets - 5-10 reps - Sidelying Shoulder Abduction Palm Forward  - 1 x daily - 1-2 x weekly - 2-3 sets - 5-10 reps - Sidelying Shoulder External Rotation AROM  - 1 x daily - 1-2 x weekly - 2-3 sets - 5-10 reps   PATIENT EDUCATION: Education details: Patient was educated on diagnosis, anatomy and pathology involved, prognosis, role of PT, and was given an HEP, demonstrating exercise with proper form following verbal and tactile cues, and was given a paper hand out to continue exercise at home. Pt was educated on and agreed to plan of care. Person educated: Patient Education method: Explanation, Demonstration, Verbal cues, and Handouts Education comprehension: verbalized understanding and returned demonstration  HOME EXERCISE PROGRAM: [id number]  ASSESSMENT:  CLINICAL IMPRESSION: PT continued therex progression for increased shoulder mobility and strength with success. Pt with increased motion with overhead flexion this session. Pt is able to comply with all cuing for proper technique of therex with minimal increase pain. Patient continues to respond well to TPDN with increased elbow mobility following (in ext) Will benefit from skilled PT to address above deficits and promote optimal return to PLOF.   OBJECTIVE IMPAIRMENTS: decreased activity tolerance, decreased coordination, decreased endurance, decreased mobility, decreased ROM, decreased strength, increased fascial restrictions, impaired perceived functional ability, impaired flexibility, impaired tone, impaired UE functional use, improper body mechanics, postural dysfunction, and pain.   ACTIVITY LIMITATIONS: carrying, lifting, sleeping, transfers, bathing, toileting, dressing, self feeding, reach over head, and hygiene/grooming  PARTICIPATION  LIMITATIONS: meal prep, cleaning, driving, shopping, community activity, occupation, yard work, church, and farming  PERSONAL FACTORS: Age, Past/current experiences, Sex, Time since onset of injury/illness/exacerbation, and 1-2 comorbidities: simultaneous wrist fx, hx of cancer  are also affecting patient's functional outcome.   REHAB POTENTIAL: Good  CLINICAL DECISION MAKING: Evolving/moderate complexity  EVALUATION COMPLEXITY: Moderate   GOALS: Goals reviewed with patient? Yes  SHORT TERM GOALS: Target date: 08/25/22  Pt will be independent with HEP in order to improve strength and mobility in order to improve function at home and work.  Baseline:07/28/22 Goal status: INITIAL    LONG TERM GOALS: Target date: 07/28/22  Patient will increase FOTO score to 62 to demonstrate predicted increase in functional mobility to complete ADLs Baseline: 32 Goal status: INITIAL   2.  Pt will demonstrated R shoulder gross AROM of R shoulder WNL in order to complete ADLS Baseline: see eval Goal status: INITIAL  3.  Pt will demonstrated R shoulder and periscapular gross MMT of 4+/5 in order to complete ADLS Baseline: see eval Goal status: INITIAL  4.  Pt will decrease worst pain as reported on NPRS by  at least 3 points in order to demonstrate clinically significant reduction in pain.  Baseline: 9/10 Goal status: INITIAL     PLAN:  PT FREQUENCY: 1-2x/week  PT DURATION: 8 weeks  PLANNED INTERVENTIONS: Therapeutic exercises, Therapeutic activity, Neuromuscular re-education, Balance training, Gait training, Patient/Family education, Self Care, Joint mobilization, DME instructions, Dry Needling, Spinal manipulation, Spinal mobilization, Cryotherapy, Moist heat, scar mobilization, Splintting, Taping, Traction, Ultrasound, Biofeedback, Manual therapy, and Re-evaluation  PLAN FOR NEXT SESSION: FU on ortho visit and updates, continue with ROM and strength as permitted    Durwin Reges  DPT  Durwin Reges, PT 08/28/2022, 10:13 AM

## 2022-08-28 NOTE — Therapy (Signed)
[facility] [address] [place], [address] Phone: [phone]   Fax:  [phone]  Occupational Therapy Treatment  Patient Details  Name: Alice Taylor MRN: [id number] Date of Birth: 07-05-1961 Referring Provider (OT): [place] PA   Encounter Date: 08/28/2022   OT End of Session - 08/28/22 1114     Visit Number 11    Number of Visits 13    Date for OT Re-Evaluation 09/11/22    OT Start Time 1115    OT Stop Time 1159    OT Time Calculation (min) 44 min    Activity Tolerance Patient tolerated treatment well    Behavior During Therapy [facility] for tasks assessed/performed             Past Medical History:  Diagnosis Date   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   GERD (gastroesophageal reflux disease)    History of kidney stones    PONV (postoperative nausea and vomiting)    x1 after orif ankle surgery    Past Surgical History:  Procedure Laterality Date   APPENDECTOMY     KNEE ARTHROSCOPY Right    KNEE ARTHROSCOPY Left 04/24/2018   Procedure: ARTHROSCOPY KNEE, PARTIAL MEDIAL MENISCECTOMY;  Surgeon: Dereck Leep, MD;  Location: [facility];  Service: Orthopedics;  Laterality: Left;   ORIF ANKLE FRACTURE Right 01/17/2019   Procedure: OPEN REDUCTION INTERNAL FIXATION (ORIF) ANKLE FRACTURE;  Surgeon: Hessie Knows, MD;  Location: [facility];  Service: Orthopedics;  Laterality: Right;   ORIF WRIST FRACTURE Right 02/21/2022   Procedure: OPEN REDUCTION INTERNAL FIXATION (ORIF) OR RIGHT DISTAL RADIUS FRACTURE.;  Surgeon: Corky Mull, MD;  Location: [facility];  Service: Orthopedics;  Laterality: Right;    There were no vitals filed for this visit.   Subjective Assessment - 08/28/22 1114     Subjective  This rainy weather yesterday really worked my arm -felt sore and hurting wrist and shoulder. Elbow still not straight .Done the putty and 1 lbs weight    Pertinent History Pt last seen by PA at Ortho on 04/03/22 - Alice Taylor  is a 61 y.o. female who presents for follow-up now 6 weeks status post an open reduction and internal fixation of her right distal radius fracture with an ulnar styloid fracture. Overall, the patient feels that she is doing well. She still notes some discomfort in her wrist which she rates at 4/10 on today's visit, and for which she has been taking Tylenol and applying ice as necessary with temporary partial relief of her symptoms. She been wearing her Velcro wrist immobilizer on a regular basis, removing it for bathing purposes and for exercises. She still notes moderate stiffness in the wrist and fingers, as well as some swelling, but denies any reinjury to the hand or wrist. She also denies any fevers or chills, and denies any numbness or paresthesias to her fingers.Xray showed good healing at radius and ulnar and refer to therapy - pt has been doing PT at [facility] 3 x wk per pt the last month - pt refer to OT for digits stiffness and possible splint. 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  As well as painting and being able to decorate at different events.    Currently in Pain? Yes    Pain Score 4     Pain Location Shoulder    Pain Orientation Right    Pain Descriptors / Indicators Aching;Sore    Pain Type Acute pain    Pain Onset 1 to 4 weeks ago    Pain Frequency Intermittent                [facility] OT Assessment - 08/28/22 0001       AROM   Right Forearm Pronation 90 Degrees    Right Forearm Supination 80 Degrees    Right Wrist Extension 60 Degrees    Right Wrist Flexion 74 Degrees      Strength   Right Hand Grip (lbs) 25    Right Hand Lateral Pinch 11 lbs    Right Hand 3 Point Pinch  10 lbs    Left Hand Grip (lbs) 60    Left Hand Lateral Pinch 15 lbs    Left Hand 3 Point Pinch 14 lbs                Patient arrived with continuous elbow extension stiffness as well as endrange supination and wrist extension more than flexion. Patient report increased stiffness and soreness after writing yesterday. Recommended for patient to use her paraffin bath also on her elbow with a gentle elbow extension stretch at home.      OT Treatments/Exercises (OP) - 08/28/22 0001       RUE Paraffin   Number Minutes Paraffin 8 Minutes    RUE Paraffin Location --   elbow and hand , wrist   Comments ext stretch and supination elbow and wrist - prior to soft tissue and ROM             Soft tissue massage to volar elbow over bicep as well as forearm prior to gentle elbow extension stretch, supination and wrist extension.   Patient showed increased grip and prehension strength since last time   Reviewed with patient stretches. And upgrade to 2 pound weight for supination and pronation with a light stretch and supination supported elbow to side 12 reps. 2 pound weight with elbow to side for radial ulnar deviation 12 reps Patient ankle crease sets and reps over the next 2 to 3 weeks pain-free.  Upgrade patient to firm green medium putty for gripping, lateral and 3-point pinch 12 reps to 15 twice a day.   If pain-free can increase to a second and the third set in the next 2 to 3 weeks.      OT Education - 08/28/22 1114     Education Details Home exercises for wrist and hand review    Person(s) Educated Patient    Methods Explanation;Demonstration;Tactile cues;Verbal cues;Handout    Comprehension Verbal cues required;Returned demonstration;Verbalized understanding              OT Short Term Goals - 07/31/22 1251       OT SHORT TERM GOAL #1   Title Patient to be independent in home program for active assisted range of motion for wrist and forearm in all planes to  show progress to be able to upgrade patient to 2 pound weight.    Status Deferred               OT Long Term Goals - 07/31/22 1252  OT LONG TERM GOAL #1   Title Right wrist and forearm active range of motion increased to within functional limits for patient to turn a doorknob as well as bath and dress using R hand more than 50%    Baseline sup 60; decrease wrist AROM compare to prior to last fall - see flowsheet    Time 6    Period Weeks    Status On-going    Target Date 09/11/22      OT LONG TERM GOAL #2   Title Right composite fist improved for patient to be able to touch palm to cut food do hair    Baseline End range composite flexion decrease -and end range PIP extention limited -    Time 6    Period Weeks    Status On-going    Target Date 09/11/22      OT LONG TERM GOAL #3   Title Right grip and prehension strength improved to more than 75% compared to left hand to be able to carry more than 5 pounds as well as 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    Baseline no strenghtening since last fall - 6 wks ago - -limited  in composite fist    Time 6    Period Weeks    Status On-going    Target Date 09/11/22                   Plan - 08/28/22 1115     Clinical Impression Statement Patient was seen by this OT after her diagnosis of postop Colles' fracture R distal radius  ORIF on 02/21/22.  Pt made great progress at that time in about 6 visits but then had had another fall early Oct that resulted in a proximal humerus nondisplaced fracture.  SHe was in sling for while and  started working with PT for shoulder - and return to OT with increase stiffness in hand, wrist and forearm but made great progress in ROM - and restriction was lift - Pt today with decrease supination , wrist and elbow extention - pt to start doing paraffin to elbow prior to ROM - can upgrade to 2 lbs for wrist and forearm - and putty to med firm green- showed increase grip  and prehension too since last seen -  Will follow up in 2 -3 wks. Cont to be limited  in  functional use of R dominant hand in ADL's and IADL"s    OT Occupational Profile and History Problem Focused Assessment - Including review of records relating to presenting problem    Occupational performance deficits (Please refer to evaluation for details): ADL's;IADL's;Work;Play;Leisure;Social Participation    Body Structure / Function / Physical Skills ADL;Strength;Dexterity;Pain;Edema;UE functional use;IADL;ROM;Scar mobility;Flexibility    Rehab Potential Good    Clinical Decision Making Limited treatment options, no task modification necessary    Comorbidities Affecting Occupational Performance: None    Modification or Assistance to Complete Evaluation  No modification of tasks or assist necessary to complete eval    OT Frequency Biweekly    OT Duration 4 weeks    OT Treatment/Interventions Self-care/ADL training;Fluidtherapy;Moist Heat;Contrast Bath;Paraffin;Manual Therapy;Patient/family education;Passive range of motion;Scar mobilization;Therapeutic exercise;Splinting;DME and/or AE instruction    Consulted and Agree with Plan of Care Patient             Patient will benefit from skilled therapeutic intervention in order to improve the following deficits and impairments:   Body Structure / Function / Physical  Skills: ADL, Strength, Dexterity, Pain, Edema, UE functional use, IADL, ROM, Scar mobility, Flexibility       Visit Diagnosis: Muscle weakness (generalized)  Stiffness of right hand, not elsewhere classified  Stiffness of right wrist, not elsewhere classified  Pain in right hand    Problem List Patient Active Problem List   Diagnosis Date Noted   Closed right trimalleolar fracture, initial encounter 01/17/2019    Rosalyn Gess, OTR/L,CLT 08/28/2022, 1:01 PM  [facility] [address] [place],  [address] Phone: [phone]   Fax:  [phone]  Name: Alice Taylor MRN: [id number] Date of Birth: 1960/11/11

## 2022-08-31 ENCOUNTER — Ambulatory Visit: Payer: PRIVATE HEALTH INSURANCE | Admitting: Physical Therapy

## 2022-08-31 ENCOUNTER — Encounter: Payer: Self-pay | Admitting: Physical Therapy

## 2022-08-31 DIAGNOSIS — M6281 Muscle weakness (generalized): Secondary | ICD-10-CM | POA: Diagnosis not present

## 2022-08-31 DIAGNOSIS — M25511 Pain in right shoulder: Secondary | ICD-10-CM

## 2022-08-31 NOTE — Therapy (Signed)
OUTPATIENT PHYSICAL THERAPY SHOULDER TREATMENT   Patient Name: Alice Taylor MRN: [id number] DOB:13-Dec-1960, 61 y.o., female Today's Date: 08/31/2022  END OF SESSION:  PT End of Session - 08/31/22 1133     Visit Number 10    Number of Visits 17    Date for PT Re-Evaluation 09/29/22    Authorization Type [employer]    Authorization Time Period 07/28/22-10/11/21    Authorization - Visit Number 10    Authorization - Number of Visits 10    Progress Note Due on Visit 10    PT Start Time 1132    PT Stop Time 1210    PT Time Calculation (min) 38 min    Activity Tolerance Patient tolerated treatment well;No increased pain    Behavior During Therapy WFL for tasks assessed/performed                    Past Medical History:  Diagnosis Date   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   GERD (gastroesophageal reflux disease)    History of kidney stones    PONV (postoperative nausea and vomiting)    x1 after orif ankle surgery   Past Surgical History:  Procedure Laterality Date   APPENDECTOMY     KNEE ARTHROSCOPY Right    KNEE ARTHROSCOPY Left 04/24/2018   Procedure: ARTHROSCOPY KNEE, PARTIAL MEDIAL MENISCECTOMY;  Surgeon: Dereck Leep, MD;  Location: [facility];  Service: Orthopedics;  Laterality: Left;   ORIF ANKLE FRACTURE Right 01/17/2019   Procedure: OPEN REDUCTION INTERNAL FIXATION (ORIF) ANKLE FRACTURE;  Surgeon: Hessie Knows, MD;  Location: [facility];  Service: Orthopedics;  Laterality: Right;   ORIF WRIST FRACTURE Right 02/21/2022   Procedure: OPEN REDUCTION INTERNAL FIXATION (ORIF) OR RIGHT DISTAL RADIUS FRACTURE.;  Surgeon: Corky Mull, MD;  Location: [facility];  Service: Orthopedics;  Laterality: Right;   Patient Active Problem List   Diagnosis Date Noted   Closed right trimalleolar fracture, initial encounter 01/17/2019    PCP: Alvera Singh FNP  REFERRING PROVIDER: Milagros Evener  REFERRING DIAG: R prox humerus fracture 06/19/22 (closed)  THERAPY DIAG:  Acute pain  of right shoulder  Muscle weakness (generalized)  Rationale for Evaluation and Treatment: Rehabilitation  ONSET DATE: 06/19/22  SUBJECTIVE:                                                                                                                                                                                      SUBJECTIVE STATEMENT: Pt reports feeling sore appropriately after dry needling, felt to be more mobile in shoulder. Reports her motion feels like it is improving, and she moving it more  PERTINENT HISTORY:  Pt is a 61 year old female presenting s/p non-surgical closed proximal humerus fracture 06/19/22 resulting from fall. Patient was in immobile sling until 07/10/22. Since 07/10/22 has been letting arm dangle, has been moving elbow and wrist, has not completed overhead lifting. Reports current and best pain level 1/10; worst pain 9/10. Reports pain is worse at night, is sleeping sitting up. Pt is R handed. Lives with husband who is assisting her in dressing and bathing (does not have a shower seat or grab bars. Is using claw foot tub with husbands help). Husband is driving at this time. She does feed herself and brush her teeth with her left hand modI, reporting this is difficult. 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 Farming is labor intensive, and she needs to be able to climb to top of the barn to throw down hay, drive the tractor and complete farm chores. Pt denies N/V, B&B changes, unexplained weight fluctuation, saddle paresthesia, fever, night sweats, or unrelenting night pain at this time.   PAIN:  Are you having pain? Yes; 5/10 Rt anterior shoulder; 2/10 Rt biceps  area pain   PRECAUTIONS: Other: no ROM over shoulder height per Cameron Proud PA 07/17/22   WEIGHT BEARING RESTRICTIONS: No  FALLS:  Has patient fallen in last 6 months? Yes. Number of falls 2   OCCUPATION: Cattle farmer  PLOF: Independent  PATIENT GOALS:Be able to return to farming chores  NEXT MD VISIT: Dec 6th 2023 with Kieth Brightly TREATMENT:                                                                                                                                         -UBE L3 19mn fwd; 238m bwd  for gentle strengthening and motion - Reclined lawn chair AAROM overhead flex with dowel; abd with dowel x12e Supine Er <> IR in 15d abd 1# DB x12  Prone row 1# DB x12 with cuing for full elbow ext at bottom of exercise Prone elbow ext 1# DB x12 min cuing for full avail elbow ext Prone shoulder abd BW x12 Standing bicep curl 3# x12 with cuing for full avail elbow ext and forearm pronation with good carry over  Seated lat stretch/release 69m34m  Manual STM with trigger point release to R UT, bicep, brachioradialis, pronator teres Following: Dry Needling: (1/1) 66m50m5 needles placed along the Biceps, and UT to decrease increased muscular spasms and trigger points with the patient positioned in supine. Patient was educated on risks and benefits of therapy and verbally consents to PT.  PROM all directions  Home exercises:  PT reviewed the following HEP with patient with patient able to demonstrate a set of the following with min cuing for correction needed. PT educated patient on parameters of therex (how/when to inc/decrease intensity, frequency, rep/set range, stretch hold time, and purpose of therex) with verbalized understanding.   Access Code: [email or web address] - Supine Pec Stretch  - 3 x daily - 7 x weekly - 30-60sec hold - Seated Upper Trapezius Stretch  - 3 x daily - 7 x weekly - 30-60secc hold - Supine Shoulder Flexion Extension AAROM with Dowel  - 2-3 x daily - 7 x  weekly - 12-20 reps - Supine Shoulder Abduction AAROM with Dowel  - 2-3 x daily - 7 x weekly - 12-20 reps - Supine Shoulder External Rotation in 45 Degrees Abduction AAROM with Dowel  - 2-3 x daily - 7 x weekly - 12-20 reps - Supine Shoulder Flexion Extension Full Range AROM  - 1 x daily - 1-2 x weekly - 2-3 sets - 5-10 reps - Sidelying Shoulder Abduction Palm Forward  - 1 x daily - 1-2 x weekly - 2-3 sets - 5-10 reps - Sidelying Shoulder External Rotation AROM  - 1 x daily - 1-2 x weekly - 2-3 sets - 5-10 reps   PATIENT EDUCATION: Education details: Patient was educated on diagnosis, anatomy and pathology involved, prognosis, role of PT, and was given an HEP, demonstrating exercise with proper form following verbal and tactile cues, and was given a paper hand out to continue exercise at home. Pt was educated on and agreed to plan of care. Person educated: Patient Education method: Explanation, Demonstration, Verbal cues, and Handouts Education comprehension: verbalized understanding and returned demonstration  HOME EXERCISE PROGRAM: [email or web address]  ASSESSMENT:  CLINICAL IMPRESSION: PT continued therex progression for increased shoulder mobility and strength with success. Pt is able to comply with all cuing for proper technique of therex with minimal increase pain. Patient continues to respond well to TPDN with increased mobility following (in ext) Will benefit from skilled PT to address above deficits and promote optimal return to PLOF.   OBJECTIVE IMPAIRMENTS: decreased activity tolerance, decreased coordination, decreased endurance, decreased mobility, decreased ROM, decreased strength, increased fascial restrictions, impaired perceived functional ability, impaired flexibility, impaired tone, impaired UE functional use, improper body mechanics, postural dysfunction, and pain.   ACTIVITY LIMITATIONS: carrying, lifting, sleeping, transfers, bathing, toileting, dressing, self feeding, reach over  head, and hygiene/grooming  PARTICIPATION LIMITATIONS: meal prep, cleaning, driving, shopping, community activity, occupation, yard work, church, and farming  PERSONAL FACTORS: Age, Past/current experiences, Sex, Time since onset of injury/illness/exacerbation, and 1-2 comorbidities: simultaneous wrist fx, hx of cancer  are also affecting patient's functional outcome.   REHAB POTENTIAL: Good  CLINICAL DECISION MAKING: Evolving/moderate complexity  EVALUATION COMPLEXITY: Moderate   GOALS: Goals reviewed with patient? Yes  SHORT TERM GOALS: Target date: 08/25/22  Pt will be independent with HEP in order to improve strength and mobility in order to improve function at home and work.  Baseline:07/28/22 Goal status: INITIAL    LONG TERM GOALS: Target date: 07/28/22  Patient will increase FOTO score to 62 to demonstrate predicted increase in functional mobility to complete ADLs Baseline: 32 Goal status: INITIAL   2.  Pt will demonstrated R shoulder gross AROM of R shoulder WNL in order to complete ADLS Baseline: see eval Goal status: INITIAL  3.  Pt will demonstrated R shoulder and periscapular gross MMT of 4+/5 in order to complete ADLS Baseline:  see eval Goal status: INITIAL  4.  Pt will decrease worst pain as reported on NPRS by at least 3 points in order to demonstrate clinically significant reduction in pain.  Baseline: 9/10 Goal status: INITIAL     PLAN:  PT FREQUENCY: 1-2x/week  PT DURATION: 8 weeks  PLANNED INTERVENTIONS: Therapeutic exercises, Therapeutic activity, Neuromuscular re-education, Balance training, Gait training, Patient/Family education, Self Care, Joint mobilization, DME instructions, Dry Needling, Spinal manipulation, Spinal mobilization, Cryotherapy, Moist heat, scar mobilization, Splintting, Taping, Traction, Ultrasound, Biofeedback, Manual therapy, and Re-evaluation  PLAN FOR NEXT SESSION: continued POC   Durwin Reges DPT  Durwin Reges, PT 08/31/2022, 12:28 PM

## 2022-09-05 ENCOUNTER — Ambulatory Visit: Payer: PRIVATE HEALTH INSURANCE | Admitting: Physical Therapy

## 2022-09-06 ENCOUNTER — Ambulatory Visit: Payer: PRIVATE HEALTH INSURANCE | Admitting: Physical Therapy

## 2022-09-08 ENCOUNTER — Ambulatory Visit: Payer: PRIVATE HEALTH INSURANCE | Admitting: Physical Therapy

## 2022-09-13 ENCOUNTER — Ambulatory Visit: Payer: PRIVATE HEALTH INSURANCE | Attending: Surgery | Admitting: Physical Therapy

## 2022-09-13 ENCOUNTER — Encounter: Payer: Self-pay | Admitting: Physical Therapy

## 2022-09-13 DIAGNOSIS — M25511 Pain in right shoulder: Secondary | ICD-10-CM | POA: Diagnosis present

## 2022-09-13 DIAGNOSIS — M25631 Stiffness of right wrist, not elsewhere classified: Secondary | ICD-10-CM | POA: Diagnosis present

## 2022-09-13 DIAGNOSIS — M6281 Muscle weakness (generalized): Secondary | ICD-10-CM | POA: Insufficient documentation

## 2022-09-13 DIAGNOSIS — M25641 Stiffness of right hand, not elsewhere classified: Secondary | ICD-10-CM | POA: Insufficient documentation

## 2022-09-13 NOTE — Therapy (Signed)
OUTPATIENT PHYSICAL THERAPY SHOULDER TREATMENT/Progress Note 07/28/22 - 09/13/22   Patient Name: Alice Taylor MRN: [id number] DOB:10/21/1960, 62 y.o., female Today's Date: 09/13/2022  END OF SESSION:  PT End of Session - 09/13/22 1008     Visit Number 11    Number of Visits 17    Date for PT Re-Evaluation 09/29/22    Authorization Type [employer]    Authorization Time Period 07/28/22-10/11/21    Authorization - Visit Number 11    Authorization - Number of Visits 17    Progress Note Due on Visit 20    PT Start Time 5329    PT Stop Time 1050    PT Time Calculation (min) 43 min    Activity Tolerance Patient tolerated treatment well;No increased pain    Behavior During Therapy WFL for tasks assessed/performed                     Past Medical History:  Diagnosis Date   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   GERD (gastroesophageal reflux disease)    History of kidney stones    PONV (postoperative nausea and vomiting)    x1 after orif ankle surgery   Past Surgical History:  Procedure Laterality Date   APPENDECTOMY     KNEE ARTHROSCOPY Right    KNEE ARTHROSCOPY Left 04/24/2018   Procedure: ARTHROSCOPY KNEE, PARTIAL MEDIAL MENISCECTOMY;  Surgeon: Dereck Leep, MD;  Location: [facility];  Service: Orthopedics;  Laterality: Left;   ORIF ANKLE FRACTURE Right 01/17/2019   Procedure: OPEN REDUCTION INTERNAL FIXATION (ORIF) ANKLE FRACTURE;  Surgeon: Hessie Knows, MD;  Location: [facility];  Service: Orthopedics;  Laterality: Right;   ORIF WRIST FRACTURE Right 02/21/2022   Procedure: OPEN REDUCTION INTERNAL FIXATION (ORIF) OR RIGHT DISTAL RADIUS FRACTURE.;  Surgeon: Corky Mull, MD;  Location: [facility];  Service: Orthopedics;  Laterality: Right;   Patient Active Problem List   Diagnosis Date Noted   Closed right trimalleolar fracture, initial encounter 01/17/2019    PCP: Alvera Singh FNP  REFERRING PROVIDER: Milagros Evener  REFERRING DIAG: R prox humerus fracture 06/19/22  (closed)  THERAPY DIAG:  Acute pain of right shoulder - Plan: PT plan of care cert/re-cert  Rationale for Evaluation and Treatment: Rehabilitation  ONSET DATE: 06/19/22  SUBJECTIVE:                                                                                                                                                                                      SUBJECTIVE STATEMENT: Pt reports that she hs been working hard on her shoulder mobility since last visit. Her wrist has been bothering her. Minimal shoulder  pain this am, more stiffness.   PERTINENT HISTORY: Pt is a 62 year old female presenting s/p non-surgical closed proximal humerus fracture 06/19/22 resulting from fall. Patient was in immobile sling until 07/10/22. Since 07/10/22 has been letting arm dangle, has been moving elbow and wrist, has not completed overhead lifting. Reports current and best pain level 1/10; worst pain 9/10. Reports pain is worse at night, is sleeping sitting up. Pt is R handed. Lives with husband who is assisting her in dressing and bathing (does not have a shower seat or grab bars. Is using claw foot tub with husbands help). Husband is driving at this time. She does feed herself and brush her teeth with her left hand modI, reporting this is difficult. 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 Farming is labor intensive, and she needs to be able to climb to top of the barn to throw down hay, drive the tractor and complete farm chores. Pt denies N/V, B&B changes, unexplained weight fluctuation, saddle paresthesia, fever, night sweats, or unrelenting night pain at this time.   PAIN:  Are you having pain?  Yes; 5/10 Rt anterior shoulder; 2/10 Rt biceps area pain   PRECAUTIONS: Other: no ROM over shoulder height per Cameron Proud PA 07/17/22   WEIGHT BEARING RESTRICTIONS: No  FALLS:  Has patient fallen in last 6 months? Yes. Number of falls 2   OCCUPATION: Cattle farmer  PLOF: Independent  PATIENT GOALS:Be able to return to farming chores  NEXT MD VISIT: Dec 6th 2023 with Kieth Brightly TREATMENT:                                                                                                                                         -UBE L3 44mn fwd; 240m bwd  for gentle strengthening and motion Fwd overhead walks with PVC; side walks abd PVC x12 - Pulleys IR AAROM x12  AROM flex: 110d abd:83d ER: C5 IR: mid sacrum   Prone row 4# DB x12 with cuing for full elbow ext at bottom of exercise Prone I 4# DB x12 min cuing for maintained full avail elbow ext Prone shoulder Y BW x12 to min overhead  Prone row 4# DB x12  Prone I 4# DB x12 min cuing for maintained full avail elbow ext Prone abd BW x12  Lat pull down 15# x12 with good carry over of initial cuing for technique Wall push up x12 from fist position due to lack of wrist ext with min cuing for equal R and L push  Lat pull down  15# x12  Wall push up x12 from fist position due to lack of wrist ext   Seated lat stretch/release 20mn   Manual STM with trigger point release to R latissimus Aggressive PROM all directions  Home exercises:  PT reviewed the following HEP with patient with patient able to demonstrate a set of the following with min cuing for correction needed. PT educated patient on parameters of therex (how/when to inc/decrease intensity, frequency, rep/set range, stretch hold time, and purpose of therex) with verbalized understanding.   Access Code: [email or web address]- Supine Pec Stretch  - 3 x daily - 7 x weekly - 30-60sec hold - Seated Upper Trapezius Stretch  - 3 x daily - 7 x weekly - 30-60secc hold - Supine  Shoulder Flexion Extension AAROM with Dowel  - 2-3 x daily - 7 x weekly - 12-20 reps - Supine Shoulder Abduction AAROM with Dowel  - 2-3 x daily - 7 x weekly - 12-20 reps - Supine Shoulder External Rotation in 45 Degrees Abduction AAROM with Dowel  - 2-3 x daily - 7 x weekly - 12-20 reps - Supine Shoulder Flexion Extension Full Range AROM  - 1 x daily - 1-2 x weekly - 2-3 sets - 5-10 reps - Sidelying Shoulder Abduction Palm Forward  - 1 x daily - 1-2 x weekly - 2-3 sets - 5-10 reps - Sidelying Shoulder External Rotation AROM  - 1 x daily - 1-2 x weekly - 2-3 sets - 5-10 reps   PATIENT EDUCATION: Education details: Patient was educated on diagnosis, anatomy and pathology involved, prognosis, role of PT, and was given an HEP, demonstrating exercise with proper form following verbal and tactile cues, and was given a paper hand out to continue exercise at home. Pt was educated on and agreed to plan of care. Person educated: Patient Education method: Explanation, Demonstration, Verbal cues, and Handouts Education comprehension: verbalized understanding and returned demonstration  HOME EXERCISE PROGRAM: [id number] ASSESSMENT:  CLINICAL IMPRESSION: PT reassessed goals this session where patient is making progress toward all goals, with pain goal met. Patient shows steady improvement in AROM and PROM. PT educated patient on more aggressive mobility for therapy and HEP to encourage improvement in mobility with understanding. PT will continue progression as able.    OBJECTIVE IMPAIRMENTS: decreased activity tolerance, decreased coordination, decreased endurance, decreased mobility, decreased ROM, decreased strength, increased fascial restrictions, impaired perceived functional ability, impaired flexibility, impaired tone, impaired UE functional use, improper body mechanics, postural dysfunction, and pain.   ACTIVITY LIMITATIONS: carrying, lifting, sleeping, transfers, bathing, toileting, dressing, self  feeding, reach over head, and hygiene/grooming  PARTICIPATION LIMITATIONS: meal prep, cleaning, driving, shopping, community activity, occupation, yard work, church, and farming  PERSONAL FACTORS: Age, Past/current experiences, Sex, Time since onset of injury/illness/exacerbation, and 1-2 comorbidities: simultaneous wrist fx, hx of cancer  are also affecting patient's functional outcome.   REHAB POTENTIAL: Good  CLINICAL DECISION MAKING: Evolving/moderate complexity  EVALUATION COMPLEXITY: Moderate   GOALS: Goals reviewed with patient? Yes  SHORT TERM GOALS: Target date: 08/25/22  Pt will be independent with HEP in order to improve strength and mobility in order to improve function at home and work.  Baseline:07/28/22 HEP given; 09/13/22 completing HEP, new one to be given next visit Goal status: revised    LONG TERM GOALS: Target date: 07/28/22  Patient will increase FOTO score to 62 to demonstrate predicted increase in functional mobility to complete ADLs Baseline: 32 09/13/22 Goal status: ONGOING   2.  Pt will  demonstrated R shoulder gross AROM of R shoulder WNL in order to complete ADLS Baseline: see eval; 09/13/22  flex: 110d abd:83d ER: C5 IR: mid sacrum Goal status: ONGOING  3.  Pt will demonstrated R shoulder and periscapular gross MMT of 4+/5 in order to complete ADLS Baseline: see eval 09/14/43 lack of motion inhibiting true MMT measurement Goal status: ONGOING  4.  Pt will decrease worst pain as reported on NPRS by at least 3 points in order to demonstrate clinically significant reduction in pain.  Baseline: 9/10; 09/13/22 3/10 Goal status: MET     PLAN:  PT FREQUENCY: 1-2x/week  PT DURATION: 8 weeks  PLANNED INTERVENTIONS: Therapeutic exercises, Therapeutic activity, Neuromuscular re-education, Balance training, Gait training, Patient/Family education, Self Care, Joint mobilization, DME instructions, Dry Needling, Spinal manipulation, Spinal mobilization,  Cryotherapy, Moist heat, scar mobilization, Splintting, Taping, Traction, Ultrasound, Biofeedback, Manual therapy, and Re-evaluation  PLAN FOR NEXT SESSION: update HEP   Durwin Reges DPT  Durwin Reges, PT 09/13/2022, 2:11 PM

## 2022-09-15 ENCOUNTER — Ambulatory Visit: Payer: PRIVATE HEALTH INSURANCE | Admitting: Physical Therapy

## 2022-09-15 ENCOUNTER — Encounter: Payer: Self-pay | Admitting: Physical Therapy

## 2022-09-15 DIAGNOSIS — M25511 Pain in right shoulder: Secondary | ICD-10-CM | POA: Diagnosis not present

## 2022-09-15 DIAGNOSIS — M25641 Stiffness of right hand, not elsewhere classified: Secondary | ICD-10-CM

## 2022-09-15 DIAGNOSIS — M6281 Muscle weakness (generalized): Secondary | ICD-10-CM

## 2022-09-15 NOTE — Therapy (Signed)
OUTPATIENT PHYSICAL THERAPY SHOULDER TREATMENT/Progress Note 07/28/22 - 09/13/22   Patient Name: Alice Taylor MRN: [id number] DOB:January 02, 1961, 62 y.o., female Today's Date: 09/15/2022  END OF SESSION:  PT End of Session - 09/15/22 0955     Visit Number 12    Number of Visits 17    Date for PT Re-Evaluation 09/29/22    Authorization Type [employer]    Authorization Time Period 07/28/22-10/11/21    Authorization - Visit Number 12    Authorization - Number of Visits 17    Progress Note Due on Visit 20    PT Start Time 0950    PT Stop Time 1030    PT Time Calculation (min) 40 min    Activity Tolerance Patient tolerated treatment well;No increased pain    Behavior During Therapy WFL for tasks assessed/performed                      Past Medical History:  Diagnosis Date   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   GERD (gastroesophageal reflux disease)    History of kidney stones    PONV (postoperative nausea and vomiting)    x1 after orif ankle surgery   Past Surgical History:  Procedure Laterality Date   APPENDECTOMY     KNEE ARTHROSCOPY Right    KNEE ARTHROSCOPY Left 04/24/2018   Procedure: ARTHROSCOPY KNEE, PARTIAL MEDIAL MENISCECTOMY;  Surgeon: Dereck Leep, MD;  Location: [facility];  Service: Orthopedics;  Laterality: Left;   ORIF ANKLE FRACTURE Right 01/17/2019   Procedure: OPEN REDUCTION INTERNAL FIXATION (ORIF) ANKLE FRACTURE;  Surgeon: Hessie Knows, MD;  Location: [facility];  Service: Orthopedics;  Laterality: Right;   ORIF WRIST FRACTURE Right 02/21/2022   Procedure: OPEN REDUCTION INTERNAL FIXATION (ORIF) OR RIGHT DISTAL RADIUS FRACTURE.;  Surgeon: Corky Mull, MD;  Location: [facility];  Service: Orthopedics;  Laterality: Right;   Patient Active Problem List   Diagnosis Date Noted   Closed right trimalleolar fracture, initial encounter 01/17/2019    PCP: Alvera Singh FNP  REFERRING PROVIDER: Milagros Evener  REFERRING DIAG: R prox humerus fracture 06/19/22  (closed)  THERAPY DIAG:  Acute pain of right shoulder  Muscle weakness (generalized)  Stiffness of right hand, not elsewhere classified  Rationale for Evaluation and Treatment: Rehabilitation  ONSET DATE: 06/19/22  SUBJECTIVE:                                                                                                                                                                                      SUBJECTIVE STATEMENT: Pt reports that she hs been working hard on her shoulder mobility since last visit. Her wrist  has been bothering her. Minimal shoulder pain this am, more stiffness.   PERTINENT HISTORY: Pt is a 62 year old female presenting s/p non-surgical closed proximal humerus fracture 06/19/22 resulting from fall. Patient was in immobile sling until 07/10/22. Since 07/10/22 has been letting arm dangle, has been moving elbow and wrist, has not completed overhead lifting. Reports current and best pain level 1/10; worst pain 9/10. Reports pain is worse at night, is sleeping sitting up. Pt is R handed. Lives with husband who is assisting her in dressing and bathing (does not have a shower seat or grab bars. Is using claw foot tub with husbands help). Husband is driving at this time. She does feed herself and brush her teeth with her left hand modI, reporting this is difficult. 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 Farming is labor intensive, and she needs to be able to climb to top of the barn to throw down hay, drive the tractor and complete farm chores. Pt denies N/V, B&B changes, unexplained weight fluctuation, saddle paresthesia, fever, night sweats, or unrelenting night pain  at this time.   PAIN:  Are you having pain? Yes; 5/10 Rt anterior shoulder; 2/10 Rt biceps area pain   PRECAUTIONS: Other: no ROM over shoulder height per Cameron Proud PA 07/17/22   WEIGHT BEARING RESTRICTIONS: No  FALLS:  Has patient fallen in last 6 months? Yes. Number of falls 2   OCCUPATION: Cattle farmer  PLOF: Independent  PATIENT GOALS:Be able to return to farming chores  NEXT MD VISIT: Dec 6th 2023 with Kieth Brightly TREATMENT:                                                                                                                                         -UBE L3 74mn fwd; 2110m bwd  for gentle strengthening and motion Fwd overhead walks with PVC; side walks abd PVC x12 - Pulleys IR AAROM x12  Lat pull down 15# x12 with cuing for eccentric control Wall push up x12 cuing for set up with good carry over; from fists Y on wall x12 with good carry over of initial cuing for set up   Lat pull down 15# x12  Wall push up x12  Y on wall x12  PT reviewed the following HEP with patient with patient able to demonstrate a set of the following with min cuing for correction needed. PT educated patient on parameters of therex (how/when to inc/decrease intensity, frequency, rep/set range, stretch hold time, and purpose of therex) with verbalized understanding.  Access Code:  [email or web address] - Standing Shoulder Flexion Wall Walk  - 2-3 x daily - 7 x weekly - 12-20 reps - 3-5sec hold - Standing Shoulder Abduction Finger Walk at Wall  - 2-3 x daily - 7 x weekly - 12-20 reps - 3-5sec hold - Standing Shoulder Internal Rotation Stretch with Towel  - 2-3 x daily - 7 x weekly - 12-20 reps - 3-5sec hold - Prone Chest Stretch on Chair  - 2-3 x daily - 7 x weekly - 30-60 hold - Standing Lat Pull Down with Resistance - Elbows Bent  - 1-2 x weekly - 2-3 sets - 12-20 reps - Low Trap Setting at [place]  - 1-2 x weekly - 2-3 sets - 12-20 reps - Wall Push Up  - 1-2 x weekly - 2-3 sets - 12-20  reps     Manual STM with trigger point release to R latissimus Following: Dry Needling: (2) 75m .25 needles placed along the R latissimus to decrease increased muscular spasms and trigger points with the patient positioned in supine using pincer/pull away ant-post direction of needle. Patient was educated on risks and benefits of therapy and verbally consents to PT.   Aggressive PROM all directions  Home exercises:  PT reviewed the following HEP with patient with patient able to demonstrate a set of the following with min cuing for correction needed. PT educated patient on parameters of therex (how/when to inc/decrease intensity, frequency, rep/set range, stretch hold time, and purpose of therex) with verbalized understanding.   Access Code: [email or web address]- Standing Shoulder Flexion Wall Walk  - 2-3 x daily - 7 x weekly - 12-20 reps - 3-5sec hold - Standing Shoulder Abduction Finger Walk at Wall  - 2-3 x daily - 7 x weekly - 12-20 reps - 3-5sec hold - Standing Shoulder Internal Rotation Stretch with Towel  - 2-3 x daily - 7 x weekly - 12-20 reps - 3-5sec hold - Prone Chest Stretch on Chair  - 2-3 x daily - 7 x weekly - 30-60 hold - Standing Lat Pull Down with Resistance - Elbows Bent  - 1-2 x weekly - 2-3 sets - 12-20 reps - [place] Trap Setting at [place] - 1-2 x weekly - 2-3 sets - 12-20 reps - Wall Push Up  - 1-2 x weekly - 2-3 sets - 12-20 reps   PATIENT EDUCATION: Education details: Patient was educated on diagnosis, anatomy and pathology involved, prognosis, role of PT, and was given an HEP, demonstrating exercise with proper form following verbal and tactile cues, and was given a paper hand out to continue exercise at home. Pt was educated on and agreed to plan of care. Person educated: Patient Education method: Explanation, Demonstration, Verbal cues, and Handouts Education comprehension: verbalized understanding and returned demonstration  HOME EXERCISE  PROGRAM: [email or web address] ASSESSMENT:  CLINICAL IMPRESSION: PT continued therex progression for increased mobility, scapulohumeral rhythm, and strength with success. PT updated HEP to reflect current progress and to allow for progression toward goals. Patient is able to verbalize and demonstrate understanding of all HEP updates. Pt is able to comply with all cuing for proper technique of therex with good effort. Patient will continue to benefit from skilled PT to address motion and strengt deficits.    OBJECTIVE IMPAIRMENTS: decreased activity tolerance, decreased coordination, decreased endurance, decreased mobility, decreased ROM, decreased strength, increased fascial restrictions, impaired perceived functional ability, impaired flexibility, impaired tone, impaired UE functional use, improper body mechanics, postural dysfunction, and pain.   ACTIVITY LIMITATIONS: carrying, lifting,  sleeping, transfers, bathing, toileting, dressing, self feeding, reach over head, and hygiene/grooming  PARTICIPATION LIMITATIONS: meal prep, cleaning, driving, shopping, community activity, occupation, yard work, church, and farming  PERSONAL FACTORS: Age, Past/current experiences, Sex, Time since onset of injury/illness/exacerbation, and 1-2 comorbidities: simultaneous wrist fx, hx of cancer  are also affecting patient's functional outcome.   REHAB POTENTIAL: Good  CLINICAL DECISION MAKING: Evolving/moderate complexity  EVALUATION COMPLEXITY: Moderate   GOALS: Goals reviewed with patient? Yes  SHORT TERM GOALS: Target date: 08/25/22  Pt will be independent with HEP in order to improve strength and mobility in order to improve function at home and work.  Baseline:07/28/22 HEP given; 09/13/22 completing HEP, new one to be given next visit Goal status: revised    LONG TERM GOALS: Target date: 07/28/22  Patient will increase FOTO score to 62 to demonstrate predicted increase in functional mobility to complete  ADLs Baseline: 32 09/13/22 Goal status: ONGOING   2.  Pt will demonstrated R shoulder gross AROM of R shoulder WNL in order to complete ADLS Baseline: see eval; 09/13/22  flex: 110d abd:83d ER: C5 IR: mid sacrum Goal status: ONGOING  3.  Pt will demonstrated R shoulder and periscapular gross MMT of 4+/5 in order to complete ADLS Baseline: see eval 09/14/43 lack of motion inhibiting true MMT measurement Goal status: ONGOING  4.  Pt will decrease worst pain as reported on NPRS by at least 3 points in order to demonstrate clinically significant reduction in pain.  Baseline: 9/10; 09/13/22 3/10 Goal status: MET     PLAN:  PT FREQUENCY: 1-2x/week  PT DURATION: 8 weeks  PLANNED INTERVENTIONS: Therapeutic exercises, Therapeutic activity, Neuromuscular re-education, Balance training, Gait training, Patient/Family education, Self Care, Joint mobilization, DME instructions, Dry Needling, Spinal manipulation, Spinal mobilization, Cryotherapy, Moist heat, scar mobilization, Splintting, Taping, Traction, Ultrasound, Biofeedback, Manual therapy, and Re-evaluation  PLAN FOR NEXT SESSION: update HEP   Durwin Reges DPT  Durwin Reges, PT 09/15/2022, 10:33 AM

## 2022-09-18 ENCOUNTER — Ambulatory Visit: Payer: PRIVATE HEALTH INSURANCE | Admitting: Physical Therapy

## 2022-09-18 ENCOUNTER — Ambulatory Visit: Payer: PRIVATE HEALTH INSURANCE | Admitting: Occupational Therapy

## 2022-09-20 ENCOUNTER — Ambulatory Visit: Payer: PRIVATE HEALTH INSURANCE | Admitting: Occupational Therapy

## 2022-09-20 ENCOUNTER — Encounter: Payer: Self-pay | Admitting: Physical Therapy

## 2022-09-20 ENCOUNTER — Encounter: Payer: Self-pay | Admitting: Occupational Therapy

## 2022-09-20 ENCOUNTER — Ambulatory Visit: Payer: PRIVATE HEALTH INSURANCE | Admitting: Physical Therapy

## 2022-09-20 DIAGNOSIS — M6281 Muscle weakness (generalized): Secondary | ICD-10-CM

## 2022-09-20 DIAGNOSIS — M25631 Stiffness of right wrist, not elsewhere classified: Secondary | ICD-10-CM

## 2022-09-20 DIAGNOSIS — M25511 Pain in right shoulder: Secondary | ICD-10-CM | POA: Diagnosis not present

## 2022-09-20 DIAGNOSIS — M25641 Stiffness of right hand, not elsewhere classified: Secondary | ICD-10-CM

## 2022-09-20 NOTE — Therapy (Signed)
OUTPATIENT PHYSICAL THERAPY SHOULDER TREATMENT/Progress Note 07/28/22 - 09/13/22   Patient Name: Alice Taylor MRN: [id number] DOB:1960/11/25, 62 y.o., female Today's Date: 09/20/2022  END OF SESSION:  PT End of Session - 09/20/22 1424     Visit Number 13    Number of Visits 17    Date for PT Re-Evaluation 09/29/22    Authorization Type [employer]    Authorization Time Period 07/28/22-10/11/21    Authorization - Visit Number 13    Authorization - Number of Visits 17    Progress Note Due on Visit 20    PT Start Time 1430    PT Stop Time 1510    PT Time Calculation (min) 40 min    Activity Tolerance Patient tolerated treatment well;No increased pain    Behavior During Therapy WFL for tasks assessed/performed                       Past Medical History:  Diagnosis Date   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   GERD (gastroesophageal reflux disease)    History of kidney stones    PONV (postoperative nausea and vomiting)    x1 after orif ankle surgery   Past Surgical History:  Procedure Laterality Date   APPENDECTOMY     KNEE ARTHROSCOPY Right    KNEE ARTHROSCOPY Left 04/24/2018   Procedure: ARTHROSCOPY KNEE, PARTIAL MEDIAL MENISCECTOMY;  Surgeon: Dereck Leep, MD;  Location: [facility];  Service: Orthopedics;  Laterality: Left;   ORIF ANKLE FRACTURE Right 01/17/2019   Procedure: OPEN REDUCTION INTERNAL FIXATION (ORIF) ANKLE FRACTURE;  Surgeon: Hessie Knows, MD;  Location: [facility];  Service: Orthopedics;  Laterality: Right;   ORIF WRIST FRACTURE Right 02/21/2022   Procedure: OPEN REDUCTION INTERNAL FIXATION (ORIF) OR RIGHT DISTAL RADIUS FRACTURE.;  Surgeon: Corky Mull, MD;  Location: [facility];  Service: Orthopedics;  Laterality: Right;   Patient Active Problem List   Diagnosis Date Noted   Closed right trimalleolar fracture, initial encounter 01/17/2019    PCP: Alvera Singh FNP  REFERRING PROVIDER: Milagros Evener  REFERRING DIAG: R prox humerus fracture 06/19/22  (closed)  THERAPY DIAG:  Acute pain of right shoulder  Muscle weakness (generalized)  Rationale for Evaluation and Treatment: Rehabilitation  ONSET DATE: 06/19/22  SUBJECTIVE:                                                                                                                                                                                      SUBJECTIVE STATEMENT: Pt reports feeling good after TPDN last visit, and that this helped with her motion. Minimal pain today.   PERTINENT HISTORY: Pt is  a 61 year old female presenting s/p non-surgical closed proximal humerus fracture 06/19/22 resulting from fall. Patient was in immobile sling until 07/10/22. Since 07/10/22 has been letting arm dangle, has been moving elbow and wrist, has not completed overhead lifting. Reports current and best pain level 1/10; worst pain 9/10. Reports pain is worse at night, is sleeping sitting up. Pt is R handed. Lives with husband who is assisting her in dressing and bathing (does not have a shower seat or grab bars. Is using claw foot tub with husbands help). Husband is driving at this time. She does feed herself and brush her teeth with her left hand modI, reporting this is difficult. 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 Farming is labor intensive, and she needs to be able to climb to top of the barn to throw down hay, drive the tractor and complete farm chores. Pt denies N/V, B&B changes, unexplained weight fluctuation, saddle paresthesia, fever, night sweats, or unrelenting night pain at this time.   PAIN:  Are you having pain? Yes; 5/10 Rt anterior shoulder; 2/10 Rt biceps area pain    PRECAUTIONS: Other: no ROM over shoulder height per Cameron Proud PA 07/17/22   WEIGHT BEARING RESTRICTIONS: No  FALLS:  Has patient fallen in last 6 months? Yes. Number of falls 2   OCCUPATION: Cattle farmer  PLOF: Independent  PATIENT GOALS:Be able to return to farming chores  NEXT MD VISIT: Dec 6th 2023 with Kieth Brightly TREATMENT:                                                                                                                                         -UBE L3 36mn fwd; 242m bwd  for gentle strengthening and motion - Pulleys IR AAROM x12  AROM flex seated x12 with cuing for scapulohumeral rhythm with good carry over; AROM abd seated with LUE behind back x12  Prone row BW x12 Prone overhead flex x12  Prone row 4# DB x12 Prone overhead flex x12  Qped alt UE raise x12 with L hand on DB when RUE lift due to wrist pain On wall x12  Doorway lat stretch 30sec Post capsule stretch 45sec hold     Manual STM with trigger point release to R latissimus Following: Dry Needling: (2) 3029m25 needles placed along the R latissimus to decrease increased muscular spasms and trigger points with the patient positioned in supine using pincer/pull away ant-post direction of needle. Patient was educated on risks and benefits of therapy and verbally consents to PT.  Aggressive PROM all directions  Home exercises:  PT reviewed the following HEP with patient with patient able to demonstrate a set of the following with min cuing for correction needed. PT educated patient on parameters of therex (how/when to inc/decrease intensity, frequency, rep/set range, stretch hold time, and purpose of therex) with verbalized understanding.   Access Code: [email or web address] - Standing Shoulder Flexion Wall Walk  - 2-3 x daily - 7 x weekly - 12-20 reps - 3-5sec hold - Standing Shoulder Abduction Finger Walk at Wall  - 2-3 x daily - 7 x weekly - 12-20 reps - 3-5sec hold - Standing Shoulder  Internal Rotation Stretch with Towel  - 2-3 x daily - 7 x weekly - 12-20 reps - 3-5sec hold - Prone Chest Stretch on Chair  - 2-3 x daily - 7 x weekly - 30-60 hold - Standing Lat Pull Down with Resistance - Elbows Bent  - 1-2 x weekly - 2-3 sets - 12-20 reps - Low Trap Setting at [place]  - 1-2 x weekly - 2-3 sets - 12-20 reps - Wall Push Up  - 1-2 x weekly - 2-3 sets - 12-20 reps   PATIENT EDUCATION: Education details: Patient was educated on diagnosis, anatomy and pathology involved, prognosis, role of PT, and was given an HEP, demonstrating exercise with proper form following verbal and tactile cues, and was given a paper hand out to continue exercise at home. Pt was educated on and agreed to plan of care. Person educated: Patient Education method: Explanation, Demonstration, Verbal cues, and Handouts Education comprehension: verbalized understanding and returned demonstration  HOME EXERCISE PROGRAM: [email or web address]  ASSESSMENT:  CLINICAL IMPRESSION: PT continued therex progression for increased mobility, scapulohumeral rhythm, and strength with success. PT updated HEP to reflect current progress and to allow for progression toward goals. Patient is able to verbalize and demonstrate understanding of all HEP updates. Pt is able to comply with all cuing for proper technique of therex with good effort. Patient will continue to benefit from skilled PT to address motion and strengt deficits.    OBJECTIVE IMPAIRMENTS: decreased activity tolerance, decreased coordination, decreased endurance, decreased mobility, decreased ROM, decreased strength, increased fascial restrictions, impaired perceived functional ability, impaired flexibility, impaired tone, impaired UE functional use, improper body mechanics, postural dysfunction, and pain.   ACTIVITY LIMITATIONS: carrying, lifting, sleeping, transfers, bathing, toileting, dressing, self feeding, reach over head, and hygiene/grooming  PARTICIPATION  LIMITATIONS: meal prep, cleaning, driving, shopping, community activity, occupation, yard work, church, and farming  PERSONAL FACTORS: Age, Past/current experiences, Sex, Time since onset of injury/illness/exacerbation, and 1-2 comorbidities: simultaneous wrist fx, hx of cancer  are also affecting patient's functional outcome.   REHAB POTENTIAL: Good  CLINICAL DECISION MAKING: Evolving/moderate complexity  EVALUATION COMPLEXITY: Moderate   GOALS: Goals reviewed with patient? Yes  SHORT TERM GOALS: Target date: 08/25/22  Pt will be independent with HEP in order to improve strength and mobility in order to improve function at home and work.  Baseline:07/28/22 HEP given; 09/13/22 completing HEP, new one to be given next visit Goal status: revised    LONG TERM GOALS: Target date: 07/28/22  Patient will increase FOTO score to 62 to demonstrate predicted increase in functional mobility to complete ADLs Baseline: 32 09/13/22 Goal status: ONGOING   2.  Pt will demonstrated R shoulder gross AROM of R shoulder WNL in order to complete ADLS Baseline: see eval; 09/13/22  flex: 110d abd:83d ER: C5 IR: mid sacrum Goal status: ONGOING  3.  Pt will demonstrated  R shoulder and periscapular gross MMT of 4+/5 in order to complete ADLS Baseline: see eval 09/14/43 lack of motion inhibiting true MMT measurement Goal status: ONGOING  4.  Pt will decrease worst pain as reported on NPRS by at least 3 points in order to demonstrate clinically significant reduction in pain.  Baseline: 9/10; 09/13/22 3/10 Goal status: MET     PLAN:  PT FREQUENCY: 1-2x/week  PT DURATION: 8 weeks  PLANNED INTERVENTIONS: Therapeutic exercises, Therapeutic activity, Neuromuscular re-education, Balance training, Gait training, Patient/Family education, Self Care, Joint mobilization, DME instructions, Dry Needling, Spinal manipulation, Spinal mobilization, Cryotherapy, Moist heat, scar mobilization, Splintting, Taping,  Traction, Ultrasound, Biofeedback, Manual therapy, and Re-evaluation  PLAN FOR NEXT SESSION: update HEP   Durwin Reges DPT  Durwin Reges, PT 09/20/2022, 3:13 PM

## 2022-09-20 NOTE — Therapy (Signed)
[facility] [address] [place], [address] Phone: [phone]   Fax:  [phone]  Occupational Therapy Treatment  Patient Details  Name: Alice Taylor MRN: [id number] Date of Birth: 1961-08-23 Referring Provider (OT): Jefferson PA   Encounter Date: 09/20/2022   OT End of Session - 09/20/22 1621     Visit Number 12    Number of Visits 16    Date for OT Re-Evaluation 11/15/22    OT Start Time 1530    OT Stop Time 1613    OT Time Calculation (min) 43 min    Activity Tolerance Patient tolerated treatment well    Behavior During Therapy [facility] for tasks assessed/performed             Past Medical History:  Diagnosis Date   Cancer ([place]) 1970   Skin   GERD (gastroesophageal reflux disease)    History of kidney stones    PONV (postoperative nausea and vomiting)    x1 after orif ankle surgery    Past Surgical History:  Procedure Laterality Date   APPENDECTOMY     KNEE ARTHROSCOPY Right    KNEE ARTHROSCOPY Left 04/24/2018   Procedure: ARTHROSCOPY KNEE, PARTIAL MEDIAL MENISCECTOMY;  Surgeon: Dereck Leep, MD;  Location: [facility];  Service: Orthopedics;  Laterality: Left;   ORIF ANKLE FRACTURE Right 01/17/2019   Procedure: OPEN REDUCTION INTERNAL FIXATION (ORIF) ANKLE FRACTURE;  Surgeon: Hessie Knows, MD;  Location: [facility] OR[facility];  Service: Orthopedics;  Laterality: Right;   ORIF WRIST FRACTURE Right 02/21/2022   Procedure: OPEN REDUCTION INTERNAL FIXATION (ORIF) OR RIGHT DISTAL RADIUS FRACTURE.;  Surgeon: Corky Mull, MD;  Location: [facility] OR[facility];  Service: Orthopedics;  Laterality: Right;    There were no vitals filed for this visit.   Subjective Assessment - 09/20/22 1619     Subjective  Doing better with the wrist and grip - doing putty and paraffin -but my pinkie still curls since previous fall and had difficulty with wall pushup    Pertinent History Pt last seen by PA at Ortho on 04/03/22 - Alice Taylor is a  62 y.o. female who presents for follow-up now 6 weeks status post an open reduction and internal fixation of her right distal radius fracture with an ulnar styloid fracture. Overall, the patient feels that she is doing well. She still notes some discomfort in her wrist which she rates at 4/10 on today's visit, and for which she has been taking Tylenol and applying ice as necessary with temporary partial relief of her symptoms. She been wearing her Velcro wrist immobilizer on a regular basis, removing it for bathing purposes and for exercises. She still notes moderate stiffness in the wrist and fingers, as well as some swelling, but denies any reinjury to the hand or wrist. She also denies any fevers or chills, and denies any numbness or paresthesias to her fingers.Xray showed good healing at radius and ulnar and refer to therapy - pt has been doing PT at [facility] 3 x wk per pt the last month - pt refer to OT for digits stiffness and possible splint. 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  As well as painting and being able to decorate at different events.    Currently in Pain? No/denies                [facility] OT Assessment - 09/20/22 0001       AROM   Right Wrist Extension 65 Degrees    Right Wrist Flexion 80 Degrees      Strength   Right Hand Grip (lbs) 30    Right Hand Lateral Pinch 14 lbs    Right Hand 3 Point Pinch 13 lbs    Left Hand Grip (lbs) 60    Left Hand Lateral Pinch 15 lbs    Left Hand 3 Point Pinch 14 lbs             Pt cont to have reports of 5th digit PIP wants to claw since previous fall. And was not able to do wall pushup earlier today. Upon assessment -grip and  prehension strength increase  As well as wrist and forearm AROM - strength  Reviewed with pt table slides to increase PROM of wrist past 70 degrees- with great success - able to do wall pushup with no increase symptoms  Pt positive Tinel  and tenderness at Cubital tunnel Report normal sensation in 5th digit  Pt elbow increase by 2 cm compare to circumference and over bicep - pt is R hand dominant  But fitted with tubigrip E for night time           OT Treatments/Exercises (OP) - 09/20/22 0001       RUE Paraffin   Number Minutes Paraffin 8 Minutes    RUE Paraffin Location Hand    Comments LMB splint on 5th PIP increase ext            Pt ed in use of LMB splint during paraffin 2-3 x day and on and off but not longer than 10 min  Prior to soft tissue mobs to volar 5th -using graston tool nr 2 for brushing and sweeping Prior to PROM for PIP extention in lumbrical fist or table  Joint mobs done to PIP with PROM - prolonged extention stretch Pt ed on HEP And use of splint  Doing table slides or in shower Will reassess in 2 wks         OT Education - 09/20/22 1620     Education Details progress for wrist, HEP changes and use of LMB splint 5th PIP    Person(s) Educated Patient    Methods Explanation;Demonstration;Tactile cues;Verbal cues;Handout    Comprehension Verbal cues required;Returned demonstration;Verbalized understanding              OT Short Term Goals - 09/20/22 1626       OT SHORT TERM GOAL #1   Title Patient to be independent in home program for active assisted range of motion for wrist and forearm in all planes to show progress to be able to upgrade patient to 2 pound weight.    Status Achieved               OT Long Term Goals - 09/20/22 1626       OT LONG TERM GOAL #1   Title Right wrist and forearm active range of motion increased to within functional limits for patient to turn a doorknob as well as bath and dress using R hand more  than 50%    Status Achieved  OT LONG TERM GOAL #2   Title Right composite fist improved for patient to be able to touch palm to cut food do hair    Status Achieved      OT LONG TERM GOAL #3   Title Right grip and prehension strength improved to more than 75% compared to left hand to be able to carry more than 5 pounds as well as 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    Status Achieved      OT LONG TERM GOAL #4   Title Pt to be ind in HEP and use of splints /cast in if indicated to decrease flexor contracture less than 10 degrees to put hand in pocket and donn gloves without issues.    Baseline PIP extention at 5th -25 degrees- tight in PROM - joint enlarge -    Time 8    Period Weeks    Status New    Target Date 11/15/22      OT LONG TERM GOAL #5   Title Pt to be able to push up from bed and chair with no increase symptoms    Baseline Report earlier today wrist bother pt with wall pushup - in session after table slides pt had no issues    Time 4    Period Weeks    Status New    Target Date 10/18/22                   Plan - 09/20/22 1622     Clinical Impression Statement Patient was seen by this OT after her diagnosis of postop Colles' fracture R distal radius  ORIF on 02/21/22.  Pt made great progress at that time in about 6 visits but then had had another fall early Oct that resulted in a proximal humerus nondisplaced fracture.  SHe was in sling for while and  started working with PT for shoulder - and return to OT with increase stiffness in hand, wrist and forearm but made great progress in ROM - and restriction was lift. Pt with AROM WNL except wrist flexion and extention - limited her WB - review HEP again and pt able to do pain free wall pushup. Pt did in the past show some Cubital tunnel symptoms- tenderness and + Tinel. Decrease extetionat 5th PIP. Grip  and prehension strength cont to improve greatly - pt ed on postural position to avoid. And  fitted with LMB splint to use during her paraffin and several times during day. Including PROM to PIP ext in lumbrical fist to avoid over stretching of volar plate at [facility]. PIP extention coming in -25 in session -16 degrees. Cont with same HEP - wrist extention and 5th PIP extention - will reassess in 2 wks - if needed can make extention cast for night time.  Cont to be limited  in  functional use of R dominant hand in ADL's and IADL's    OT Occupational Profile and History Problem Focused Assessment - Including review of records relating to presenting problem    Occupational performance deficits (Please refer to evaluation for details): ADL's;IADL's;Work;Play;Leisure;Social Participation    Body Structure / Function / Physical Skills ADL;Strength;Dexterity;Pain;Edema;UE functional use;IADL;ROM;Scar mobility;Flexibility    Rehab Potential Good    Clinical Decision Making Limited treatment options, no task modification necessary    Comorbidities Affecting Occupational Performance: None    Modification or Assistance to Complete Evaluation  No modification of tasks or assist necessary to complete eval  OT Frequency Biweekly    OT Duration 8 weeks    OT Treatment/Interventions Self-care/ADL training;Fluidtherapy;Moist Heat;Contrast Bath;Paraffin;Manual Therapy;Patient/family education;Passive range of motion;Scar mobilization;Therapeutic exercise;Splinting;DME and/or AE instruction    Consulted and Agree with Plan of Care Patient             Patient will benefit from skilled therapeutic intervention in order to improve the following deficits and impairments:   Body Structure / Function / Physical Skills: ADL, Strength, Dexterity, Pain, Edema, UE functional use, IADL, ROM, Scar mobility, Flexibility       Visit Diagnosis: Stiffness of right hand, not elsewhere classified  Stiffness of right wrist, not elsewhere classified  Muscle weakness (generalized)    Problem List Patient Active  Problem List   Diagnosis Date Noted   Closed right trimalleolar fracture, initial encounter 01/17/2019    Rosalyn Gess, OTR/L,CLT 09/20/2022, 4:30 PM  [facility] [address] [place], [address] Phone: [phone]   Fax:  [phone]  Name: Alice Taylor MRN: [id number] Date of Birth: Nov 24, 1960

## 2022-09-21 ENCOUNTER — Ambulatory Visit: Payer: PRIVATE HEALTH INSURANCE | Admitting: Occupational Therapy

## 2022-09-22 ENCOUNTER — Encounter: Payer: Self-pay | Admitting: Physical Therapy

## 2022-09-22 ENCOUNTER — Ambulatory Visit: Payer: PRIVATE HEALTH INSURANCE | Admitting: Physical Therapy

## 2022-09-22 DIAGNOSIS — M25511 Pain in right shoulder: Secondary | ICD-10-CM

## 2022-09-22 NOTE — Therapy (Signed)
OUTPATIENT PHYSICAL THERAPY SHOULDER TREATMENT/Progress Note 07/28/22 - 09/13/22   Patient Name: Alice Taylor MRN: [id number] DOB:[birth year], 62 y.o., female Today's Date: 09/22/2022  END OF SESSION:  PT End of Session - 09/22/22 1049     Visit Number 14    Number of Visits 17    Date for PT Re-Evaluation 09/29/22    Authorization Type [employer]    Authorization Time Period 07/28/22-10/11/21    Authorization - Number of Visits 17    Progress Note Due on Visit 20    PT Start Time 1000    PT Stop Time 1045    PT Time Calculation (min) 45 min    Activity Tolerance Patient tolerated treatment well;No increased pain    Behavior During Therapy WFL for tasks assessed/performed                        Past Medical History:  Diagnosis Date   Cancer ([place]) 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   GERD (gastroesophageal reflux disease)    History of kidney stones    PONV (postoperative nausea and vomiting)    x1 after orif ankle surgery   Past Surgical History:  Procedure Laterality Date   APPENDECTOMY     KNEE ARTHROSCOPY Right    KNEE ARTHROSCOPY Left 04/24/2018   Procedure: ARTHROSCOPY KNEE, PARTIAL MEDIAL MENISCECTOMY;  Surgeon: Dereck Leep, MD;  Location: [facility];  Service: Orthopedics;  Laterality: Left;   ORIF ANKLE FRACTURE Right 01/17/2019   Procedure: OPEN REDUCTION INTERNAL FIXATION (ORIF) ANKLE FRACTURE;  Surgeon: Hessie Knows, MD;  Location: [facility];  Service: Orthopedics;  Laterality: Right;   ORIF WRIST FRACTURE Right 02/21/2022   Procedure: OPEN REDUCTION INTERNAL FIXATION (ORIF) OR RIGHT DISTAL RADIUS FRACTURE.;  Surgeon: Corky Mull, MD;  Location: [facility];  Service: Orthopedics;  Laterality: Right;   Patient Active Problem List   Diagnosis Date Noted   Closed right trimalleolar fracture, initial encounter 01/17/2019    PCP: Alvera Singh FNP  REFERRING PROVIDER: Milagros Evener  REFERRING DIAG: R prox humerus fracture 06/19/22 (closed)  THERAPY DIAG:  Acute  pain of right shoulder  Rationale for Evaluation and Treatment: Rehabilitation  ONSET DATE: 06/19/22  SUBJECTIVE:                                                                                                                                                                                      SUBJECTIVE STATEMENT: Pt reports her shoulder feels "pretty good". Less pain during the day and at night. HEP doing well. Worked outside picking up pecans for a couple hours and the activity did not aggravate her pain.  PERTINENT HISTORY: Pt is a 62 year old female presenting s/p non-surgical closed proximal humerus fracture 06/19/22 resulting from fall. Patient was in immobile sling until 07/10/22. Since 07/10/22 has been letting arm dangle, has been moving elbow and wrist, has not completed overhead lifting. Reports current and best pain level 1/10; worst pain 9/10. Reports pain is worse at night, is sleeping sitting up. Pt is R handed. Lives with husband who is assisting her in dressing and bathing (does not have a shower seat or grab bars. Is using claw foot tub with husbands help). Husband is driving at this time. She does feed herself and brush her teeth with her left hand modI, reporting this is difficult. 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 Farming is labor intensive, and she needs to be able to climb to top of the barn to throw down hay, drive the tractor and complete farm chores. Pt denies N/V, B&B changes, unexplained weight fluctuation, saddle paresthesia, fever, night sweats, or unrelenting night pain at this time.   PAIN:  Are you having pain? Yes; 5/10 Rt anterior shoulder; 2/10 Rt  biceps area pain   PRECAUTIONS: Other: no ROM over shoulder height per Cameron Proud PA 07/17/22   WEIGHT BEARING RESTRICTIONS: No  FALLS:  Has patient fallen in last 6 months? Yes. Number of falls 2   OCCUPATION: Cattle farmer  PLOF: Independent  PATIENT GOALS:Be able to return to farming chores  NEXT MD VISIT: Dec 6th 2023 with Kieth Brightly TREATMENT: 09/22/22  Ther-Ex - UBE L3 2 min fwd; 2 min bwd for gentle strengthening and motion - Pulleys IR AAROM x 12 - Seated AROM flex seated x 12 with cuing for scapulohumeral rhythm with good carry over - Seated AROM abd with LUE behind back x 12 - Prone row 8# DB x 10 - Prone overhead flex x 12  - Wall pushups x 12 - W's 2# DB x 12 - Doorway low trap stretch 3 x 30 secs   Manual - GH distraction 3 x 30 secs       09/20/22                                                                                                                              - UBE L3 74mn fwd; 242m bwd  for gentle strengthening and motion - Pulleys IR AAROM x12 - AROM flex seated x12 with cuing for scapulohumeral rhythm with good carry over; AROM abd seated with LUE behind back x12 - Prone row 4# DB x12 - Prone overhead flex x12 - Qped alt UE raise x12 with  L hand on DB when RUE lift due to wrist pain - Qped on wall x12 - Doorway lat stretch 30sec - Post capsule stretch 45sec hold     Manual STM with trigger point release to R latissimus Following: Dry Needling: (2) 26m .25 needles placed along the R latissimus to decrease increased muscular spasms and trigger points with the patient positioned in supine using pincer/pull away ant-post direction of needle. Patient was educated on risks and benefits of therapy and verbally consents to PT.   Aggressive PROM all directions  Home exercises:  PT reviewed the following HEP with patient with patient able to demonstrate a set of the following with min cuing for correction needed. PT educated  patient on parameters of therex (how/when to inc/decrease intensity, frequency, rep/set range, stretch hold time, and purpose of therex) with verbalized understanding.   Access Code: [email or web address]- Standing Shoulder Flexion Wall Walk  - 2-3 x daily - 7 x weekly - 12-20 reps - 3-5sec hold - Standing Shoulder Abduction Finger Walk at Wall  - 2-3 x daily - 7 x weekly - 12-20 reps - 3-5sec hold - Standing Shoulder Internal Rotation Stretch with Towel  - 2-3 x daily - 7 x weekly - 12-20 reps - 3-5sec hold - Prone Chest Stretch on Chair  - 2-3 x daily - 7 x weekly - 30-60 hold - Standing Lat Pull Down with Resistance - Elbows Bent  - 1-2 x weekly - 2-3 sets - 12-20 reps - Low Trap Setting at [place] - 1-2 x weekly - 2-3 sets - 12-20 reps - Wall Push Up  - 1-2 x weekly - 2-3 sets - 12-20 reps   PATIENT EDUCATION: Education details: Patient was educated on diagnosis, anatomy and pathology involved, prognosis, role of PT, and was given an HEP, demonstrating exercise with proper form following verbal and tactile cues, and was given a paper hand out to continue exercise at home. Pt was educated on and agreed to plan of care. Person educated: Patient Education method: Explanation, Demonstration, Verbal cues, and Handouts Education comprehension: verbalized understanding and returned demonstration  HOME EXERCISE PROGRAM: [id number] ASSESSMENT:  CLINICAL IMPRESSION: PT continued therex progression for increased mobility, scapulohumeral rhythm, and strength with success. Pt was educated on one additional therex for stability. Pt is progressing well towards all the goals set at IE. Pt is able to demonstrate full understanding and verbally recall the HEP update. Pt put forth good effort throughout the session. Pt will continue  will continue to benefit from PT to address strength and mobility deficits.   OBJECTIVE IMPAIRMENTS: decreased activity tolerance, decreased coordination, decreased endurance, decreased  mobility, decreased ROM, decreased strength, increased fascial restrictions, impaired perceived functional ability, impaired flexibility, impaired tone, impaired UE functional use, improper body mechanics, postural dysfunction, and pain.   ACTIVITY LIMITATIONS: carrying, lifting, sleeping, transfers, bathing, toileting, dressing, self feeding, reach over head, and hygiene/grooming  PARTICIPATION LIMITATIONS: meal prep, cleaning, driving, shopping, community activity, occupation, yard work, church, and farming  PERSONAL FACTORS: Age, Past/current experiences, Sex, Time since onset of injury/illness/exacerbation, and 1-2 comorbidities: simultaneous wrist fx, hx of cancer  are also affecting patient's functional outcome.   REHAB POTENTIAL: Good  CLINICAL DECISION MAKING: Evolving/moderate complexity  EVALUATION COMPLEXITY: Moderate   GOALS: Goals reviewed with patient? Yes  SHORT TERM GOALS: Target date: 08/25/22  Pt will be independent with HEP in order to improve strength and mobility in order to improve function at home and work.  Baseline:07/28/22 HEP  given; 09/13/22 completing HEP, new one to be given next visit Goal status: revised    LONG TERM GOALS: Target date: 07/28/22  Patient will increase FOTO score to 62 to demonstrate predicted increase in functional mobility to complete ADLs Baseline: 32 09/13/22 Goal status: ONGOING   2.  Pt will demonstrated R shoulder gross AROM of R shoulder WNL in order to complete ADLS Baseline: see eval; 09/13/22  flex: 110d abd:83d ER: C5 IR: mid sacrum Goal status: ONGOING  3.  Pt will demonstrated R shoulder and periscapular gross MMT of 4+/5 in order to complete ADLS Baseline: see eval 09/14/43 lack of motion inhibiting true MMT measurement Goal status: ONGOING  4.  Pt will decrease worst pain as reported on NPRS by at least 3 points in order to demonstrate clinically significant reduction in pain.  Baseline: 9/10; 09/13/22 3/10 Goal status:  MET     PLAN:  PT FREQUENCY: 1-2x/week  PT DURATION: 8 weeks  PLANNED INTERVENTIONS: Therapeutic exercises, Therapeutic activity, Neuromuscular re-education, Balance training, Gait training, Patient/Family education, Self Care, Joint mobilization, DME instructions, Dry Needling, Spinal manipulation, Spinal mobilization, Cryotherapy, Moist heat, scar mobilization, Splintting, Taping, Traction, Ultrasound, Biofeedback, Manual therapy, and Re-evaluation  PLAN FOR NEXT SESSION: update HEP  Stanford Scotland, SPT  Durwin Reges DPT Durwin Reges DPT  Durwin Reges, PT 09/22/2022, 11:01 AM

## 2022-09-26 ENCOUNTER — Encounter: Payer: Self-pay | Admitting: Physical Therapy

## 2022-09-26 ENCOUNTER — Ambulatory Visit: Payer: PRIVATE HEALTH INSURANCE | Admitting: Physical Therapy

## 2022-09-26 DIAGNOSIS — M25511 Pain in right shoulder: Secondary | ICD-10-CM

## 2022-09-26 NOTE — Therapy (Signed)
OUTPATIENT PHYSICAL THERAPY SHOULDER TREATMENT/Progress Note 07/28/22 - 09/13/22   Patient Name: Alice Taylor MRN: [id number] DOB:1960-12-13, 62 y.o., female Today's Date: 10/27/2022  END OF SESSION:  PT End of Session - 10/27/22 1102     Visit Number 15    Number of Visits 17    Date for PT Re-Evaluation 09/29/22    Authorization - Visit Number 13    Authorization - Number of Visits 17    Progress Note Due on Visit 20    PT Start Time 0915    PT Stop Time 1000    PT Time Calculation (min) 45 min    Activity Tolerance Patient tolerated treatment well    Behavior During Therapy WFL for tasks assessed/performed                          Past Medical History:  Diagnosis Date   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   GERD (gastroesophageal reflux disease)    History of kidney stones    PONV (postoperative nausea and vomiting)    x1 after orif ankle surgery   Past Surgical History:  Procedure Laterality Date   APPENDECTOMY     KNEE ARTHROSCOPY Right    KNEE ARTHROSCOPY Left 04/24/2018   Procedure: ARTHROSCOPY KNEE, PARTIAL MEDIAL MENISCECTOMY;  Surgeon: Dereck Leep, MD;  Location: [facility];  Service: Orthopedics;  Laterality: Left;   ORIF ANKLE FRACTURE Right 01/17/2019   Procedure: OPEN REDUCTION INTERNAL FIXATION (ORIF) ANKLE FRACTURE;  Surgeon: Hessie Knows, MD;  Location: [facility];  Service: Orthopedics;  Laterality: Right;   ORIF WRIST FRACTURE Right 02/21/2022   Procedure: OPEN REDUCTION INTERNAL FIXATION (ORIF) OR RIGHT DISTAL RADIUS FRACTURE.;  Surgeon: Corky Mull, MD;  Location: [facility];  Service: Orthopedics;  Laterality: Right;   Patient Active Problem List   Diagnosis Date Noted   Closed right trimalleolar fracture, initial encounter 01/17/2019    PCP: Alvera Singh FNP  REFERRING PROVIDER: Milagros Evener  REFERRING DIAG: R prox humerus fracture 06/19/22 (closed)  THERAPY DIAG:  Acute pain of right shoulder  Rationale for Evaluation and Treatment:  Rehabilitation  ONSET DATE: 06/19/22  SUBJECTIVE:                                                                                                                                                                                      SUBJECTIVE STATEMENT: Pt reports her shoulder feels about the same as last session. Pain in shoulder and wrist ISQ. Has minimal pain throughout the day and at night. Pt reports the HEP is going well. Worked outside on the farm over the weekend  and it did not aggravate sx. Pt reports "the cold weather has brought on some stiffness" in her shoulder.  PERTINENT HISTORY: Pt is a 62 year old female presenting s/p non-surgical closed proximal humerus fracture 06/19/22 resulting from fall. Patient was in immobile sling until 07/10/22. Since 07/10/22 has been letting arm dangle, has been moving elbow and wrist, has not completed overhead lifting. Reports current and best pain level 1/10; worst pain 9/10. Reports pain is worse at night, is sleeping sitting up. Pt is R handed. Lives with husband who is assisting her in dressing and bathing (does not have a shower seat or grab bars. Is using claw foot tub with husbands help). Husband is driving at this time. She does feed herself and brush her teeth with her left hand modI, reporting this is difficult. 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 Farming is labor intensive, and she needs to be able to climb to top of the barn to throw down hay, drive the tractor and complete farm chores. Pt denies N/V, B&B changes, unexplained weight fluctuation, saddle paresthesia, fever, night sweats, or unrelenting night pain at this time.   PAIN:   Are you having pain? Yes; 5/10 Rt anterior shoulder; 2/10 Rt biceps area pain   PRECAUTIONS: Other: no ROM over shoulder height per Cameron Proud PA 07/17/22   WEIGHT BEARING RESTRICTIONS: No  FALLS:  Has patient fallen in last 6 months? Yes. Number of falls 2   OCCUPATION: Cattle farmer  PLOF: Independent  PATIENT GOALS:Be able to return to farming chores  NEXT MD VISIT: Dec 6th 2023 with Kieth Brightly TREATMENT: 09/26/22 - UBE L3 2 min fwd; 2 min bwd for gentle strengthening and motion - Pulleys IR AAROM x 12 - DB carry 5# with shoulder shrug - Seated AROM flex - Seated AROM abd with LUE behind back x 12  - Prone row 8# DB x 12 - Prone overhead flex x 12 - W's 3# DB x 8 - I's 3# DB x 12 - T's 3# DB x 8 - Doorway low trap stretch 1 x 30 secs    09/22/22 Ther-Ex - UBE L3 2 min fwd; 2 min bwd for gentle strengthening and motion - Pulleys IR AAROM x 12 - Seated AROM flex seated x 12 with cuing for scapulohumeral rhythm with good carry over - Seated AROM abd with LUE behind back x 12 - Prone row 8# DB x 10 - Prone overhead flex x 12  - Wall pushups x 12 - W's 2# DB x 12 - Doorway low trap stretch 3 x 30 secs  Manual - GH distraction 3 x 30 secs   09/20/22                                                                                                                              -  UBE L3 63mn fwd; 245m bwd  for gentle strengthening and motion - Pulleys IR AAROM x12 - AROM flex seated x12 with cuing for scapulohumeral rhythm with good carry over; AROM abd seated with LUE behind back x12 - Prone row 4# DB x12 - Prone overhead flex x12 - Qped alt UE raise x12 with L hand on DB when RUE lift due to wrist pain - Qped on wall x12 - Doorway lat stretch 30sec - Post capsule stretch 45sec hold     Manual STM with trigger point release to R latissimus Following: Dry Needling: (2) 3025m25 needles placed along the R latissimus to decrease increased muscular spasms  and trigger points with the patient positioned in supine using pincer/pull away ant-post direction of needle. Patient was educated on risks and benefits of therapy and verbally consents to PT.   Aggressive PROM all directions  Home exercises:  PT reviewed the following HEP with patient with patient able to demonstrate a set of the following with min cuing for correction needed. PT educated patient on parameters of therex (how/when to inc/decrease intensity, frequency, rep/set range, stretch hold time, and purpose of therex) with verbalized understanding.   Access Code: [email or web address] Shoulder Flexion Wall Walk  - 2-3 x daily - 7 x weekly - 12-20 reps - 3-5sec hold - Standing Shoulder Abduction Finger Walk at Wall  - 2-3 x daily - 7 x weekly - 12-20 reps - 3-5sec hold - Standing Shoulder Internal Rotation Stretch with Towel  - 2-3 x daily - 7 x weekly - 12-20 reps - 3-5sec hold - Prone Chest Stretch on Chair  - 2-3 x daily - 7 x weekly - 30-60 hold - Standing Lat Pull Down with Resistance - Elbows Bent  - 1-2 x weekly - 2-3 sets - 12-20 reps - Low Trap Setting at [place] 1-2 x weekly - 2-3 sets - 12-20 reps - Wall Push Up  - 1-2 x weekly - 2-3 sets - 12-20 reps   PATIENT EDUCATION: Education details: Patient was educated on diagnosis, anatomy and pathology involved, prognosis, role of PT, and was given an HEP, demonstrating exercise with proper form following verbal and tactile cues, and was given a paper hand out to continue exercise at home. Pt was educated on and agreed to plan of care. Person educated: Patient Education method: Explanation, Demonstration, Verbal cues, and Handouts Education comprehension: verbalized understanding and returned demonstration  HOME EXERCISE PROGRAM: [id number]:  CLINICAL IMPRESSION: PT monitored pain throughout session; NPS: 0/10. PT continued therex and added on progressions to increase ROM, scapulohumeral rhythm, and strength. Pt was  educated on new therex for ROM and strength. Pt is continuing to progress towards all the goals set at the initial eval. Pt is able to demonstrate full understanding and verbally recall the HEP update. Pt put forth good effort throughout the session. Pt will continue will continue to benefit from PT to address strength and mobility deficits.   OBJECTIVE IMPAIRMENTS: decreased activity tolerance, decreased coordination, decreased endurance, decreased mobility, decreased ROM, decreased strength, increased fascial restrictions, impaired perceived functional ability, impaired flexibility, impaired tone, impaired UE functional use, improper body mechanics, postural dysfunction, and pain.   ACTIVITY LIMITATIONS: carrying, lifting, sleeping, transfers, bathing, toileting, dressing, self feeding, reach over head, and hygiene/grooming  PARTICIPATION LIMITATIONS: meal prep, cleaning, driving, shopping, community activity, occupation, yard work, church, and farming  PERSONAL FACTORS: Age, Past/current experiences, Sex, Time since onset of injury/illness/exacerbation, and 1-2 comorbidities: simultaneous  wrist fx, hx of cancer  are also affecting patient's functional outcome.   REHAB POTENTIAL: Good  CLINICAL DECISION MAKING: Evolving/moderate complexity  EVALUATION COMPLEXITY: Moderate   GOALS: Goals reviewed with patient? Yes  SHORT TERM GOALS: Target date: 08/25/22  Pt will be independent with HEP in order to improve strength and mobility in order to improve function at home and work.  Baseline:07/28/22 HEP given; 09/13/22 completing HEP, new one to be given next visit Goal status: revised    LONG TERM GOALS: Target date: 07/28/22  Patient will increase FOTO score to 62 to demonstrate predicted increase in functional mobility to complete ADLs Baseline: 32 09/13/22 Goal status: ONGOING   2.  Pt will demonstrated R shoulder gross AROM of R shoulder WNL in order to complete ADLS Baseline: see  eval; 09/13/22  flex: 110d abd:83d ER: C5 IR: mid sacrum Goal status: ONGOING  3.  Pt will demonstrated R shoulder and periscapular gross MMT of 4+/5 in order to complete ADLS Baseline: see eval 09/14/43 lack of motion inhibiting true MMT measurement Goal status: ONGOING  4.  Pt will decrease worst pain as reported on NPRS by at least 3 points in order to demonstrate clinically significant reduction in pain.  Baseline: 9/10; 09/13/22 3/10 Goal status: MET     PLAN:  PT FREQUENCY: 1-2x/week  PT DURATION: 8 weeks  PLANNED INTERVENTIONS: Therapeutic exercises, Therapeutic activity, Neuromuscular re-education, Balance training, Gait training, Patient/Family education, Self Care, Joint mobilization, DME instructions, Dry Needling, Spinal manipulation, Spinal mobilization, Cryotherapy, Moist heat, scar mobilization, Splintting, Taping, Traction, Ultrasound, Biofeedback, Manual therapy, and Re-evaluation  PLAN FOR NEXT SESSION: progress and update HEP  Stanford [place], SPT  Durwin Reges DPT Durwin Reges DPT  Durwin Reges, PT 10/27/2022, 11:03 AM

## 2022-09-29 ENCOUNTER — Ambulatory Visit: Payer: PRIVATE HEALTH INSURANCE | Admitting: Physical Therapy

## 2022-10-03 ENCOUNTER — Ambulatory Visit: Payer: PRIVATE HEALTH INSURANCE | Admitting: Physical Therapy

## 2022-10-03 ENCOUNTER — Ambulatory Visit: Payer: PRIVATE HEALTH INSURANCE | Admitting: Occupational Therapy

## 2022-10-03 DIAGNOSIS — M6281 Muscle weakness (generalized): Secondary | ICD-10-CM

## 2022-10-03 DIAGNOSIS — M25641 Stiffness of right hand, not elsewhere classified: Secondary | ICD-10-CM

## 2022-10-03 DIAGNOSIS — M25511 Pain in right shoulder: Secondary | ICD-10-CM | POA: Diagnosis not present

## 2022-10-03 NOTE — Therapy (Signed)
[facility] [address] [place], [address] Phone: [phone]   Fax:  [phone]  Occupational Therapy Treatment  Patient Details  Name: Alice Taylor MRN: [id number] Date of Birth: [birth year] Referring Provider (OT): [place] PA   Encounter Date: 10/03/2022   OT End of Session - 10/03/22 1301     Visit Number 13    Number of Visits 16    Date for OT Re-Evaluation 11/15/22    OT Start Time 1122    OT Stop Time 1158    OT Time Calculation (min) 36 min    Activity Tolerance Patient tolerated treatment well    Behavior During Therapy [facility] for tasks assessed/performed             Past Medical History:  Diagnosis Date   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   GERD (gastroesophageal reflux disease)    History of kidney stones    PONV (postoperative nausea and vomiting)    x1 after orif ankle surgery    Past Surgical History:  Procedure Laterality Date   APPENDECTOMY     KNEE ARTHROSCOPY Right    KNEE ARTHROSCOPY Left 04/24/2018   Procedure: ARTHROSCOPY KNEE, PARTIAL MEDIAL MENISCECTOMY;  Surgeon: Dereck Leep, MD;  Location: [facility];  Service: Orthopedics;  Laterality: Left;   ORIF ANKLE FRACTURE Right 01/17/2019   Procedure: OPEN REDUCTION INTERNAL FIXATION (ORIF) ANKLE FRACTURE;  Surgeon: Hessie Knows, MD;  Location: [facility];  Service: Orthopedics;  Laterality: Right;   ORIF WRIST FRACTURE Right 02/21/2022   Procedure: OPEN REDUCTION INTERNAL FIXATION (ORIF) OR RIGHT DISTAL RADIUS FRACTURE.;  Surgeon: Corky Mull, MD;  Location: [facility];  Service: Orthopedics;  Laterality: Right;    There were no vitals filed for this visit.   Subjective Assessment - 10/03/22 1124     Subjective  I tried to sleep with my arms straight last night.  And I felt it was tightening up so I tried to straighten it fast and I heard pop in my shoulder.  And today I had low but less stiffness more motion I think.  Driving with my hand on  top of the steering wheel was better.  Awaiting for MRI to be scheduled.  Finger about the same I think.    Pertinent History Pt last seen by PA at Ortho on 04/03/22 - Alice Taylor is a 62 y.o. female who presents for follow-up now 6 weeks status post an open reduction and internal fixation of her right distal radius fracture with an ulnar styloid fracture. Overall, the patient feels that she is doing well. She still notes some discomfort in her wrist which she rates at 4/10 on today's visit, and for which she has been taking Tylenol and applying ice as necessary with temporary partial relief of her symptoms. She been wearing her Velcro wrist immobilizer on a regular basis, removing it for bathing purposes and for exercises. She still notes moderate stiffness in the wrist and fingers, as well as some swelling, but denies any reinjury to the hand or wrist. She also denies any fevers or chills, and denies any numbness or paresthesias to her fingers.Xray showed good healing at radius and ulnar and refer to therapy - pt has been doing PT at [facility] 3 x wk per pt the last month - pt refer to OT for digits stiffness and possible splint. 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  As well as painting and being able to decorate at different events.    Currently in Pain? No/denies                [facility] OT Assessment - 10/03/22 0001       Strength   Right Hand Grip (lbs) 25    Right Hand Lateral Pinch 14 lbs    Right Hand 3 Point Pinch 13 lbs    Left Hand Grip (lbs) 54    Left Hand Lateral Pinch 15 lbs    Left Hand 3 Point Pinch 14 lbs      Right Hand AROM   R Little PIP 0-100 --   -28 coming in              On picking up my sisters paraffin bath today.  Mine at home gets sometimes too hot and then too cold.  Or takes too long.  So I only used mine in the morning.  With a PIP splint on. I tried to stretch it and extension like this. Reminded patient again to focus on PIP extension of the fifth and a lumbrical fist with MC at 90 degrees. Otherwise she overstretched the volar plate of the third metacarpal.        OT Treatments/Exercises (OP) - 10/03/22 0001       RUE Paraffin   Number Minutes Paraffin 8 Minutes    RUE Paraffin Location --   hand   Comments prior to soft tissue, LMB splint on for PIP extentio nat 5th             Motion about the same than last time.  Patient to continue to focus on composite wrist and digit extension and flexion in the shower.  Doing table slides. Followed by addressing PIP extension using her LMB splint and paraffin to 3 times a day. Soft tissue massage in the volar digit done by OT [place] tool #2 for brushing with passive range of motion and gentle joint mobs to the PIP. Review and address PIP extension with MC at 90 degree flexion. Patient can do that several times during the day. Passive range of motion 0 in session. Recommended and done quick cast for PIP extension to use at nighttime. Patient can wear it for a few hours before bedtime prior to trying to go through the night. Reassess in a weeks time PIP extension.       OT Education - 10/03/22 1301     Education Details progress for wrist, HEP changes and use of LMB splint 5th PIP; and cast at nighttime    Person(s) Educated Patient    Methods Explanation;Demonstration;Tactile cues;Verbal cues;Handout    Comprehension Verbal cues required;Returned demonstration;Verbalized understanding              OT Short Term Goals - 09/20/22 1626       OT SHORT TERM GOAL #1   Title Patient to be independent in home program for active assisted range of motion for wrist and forearm in  all planes to show progress to be able to upgrade patient to 2 pound weight.    Status Achieved  OT Long Term Goals - 09/20/22 1626       OT LONG TERM GOAL #1   Title Right wrist and forearm active range of motion increased to within functional limits for patient to turn a doorknob as well as bath and dress using R hand more than 50%    Status Achieved      OT LONG TERM GOAL #2   Title Right composite fist improved for patient to be able to touch palm to cut food do hair    Status Achieved      OT LONG TERM GOAL #3   Title Right grip and prehension strength improved to more than 75% compared to left hand to be able to carry more than 5 pounds as well as 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    Status Achieved      OT LONG TERM GOAL #4   Title Pt to be ind in HEP and use of splints /cast in if indicated to decrease flexor contracture less than 10 degrees to put hand in pocket and donn gloves without issues.    Baseline PIP extention at 5th -25 degrees- tight in PROM - joint enlarge -    Time 8    Period Weeks    Status New    Target Date 11/15/22      OT LONG TERM GOAL #5   Title Pt to be able to push up from bed and chair with no increase symptoms    Baseline Report earlier today wrist bother pt with wall pushup - in session after table slides pt had no issues    Time 4    Period Weeks    Status New    Target Date 10/18/22                   Plan - 10/03/22 1302     OT Occupational Profile and History Problem Focused Assessment - Including review of records relating to presenting problem    Occupational performance deficits (Please refer to evaluation for details): ADL's;IADL's;Work;Play;Leisure;Social Participation    Body Structure / Function / Physical Skills ADL;Strength;Dexterity;Pain;Edema;UE functional use;IADL;ROM;Scar mobility;Flexibility    Rehab Potential Good    Clinical Decision Making Limited treatment  options, no task modification necessary    Comorbidities Affecting Occupational Performance: None    Modification or Assistance to Complete Evaluation  No modification of tasks or assist necessary to complete eval    OT Frequency --   1 wk to biweekly dep on progress   OT Duration 8 weeks    OT Treatment/Interventions Self-care/ADL training;Fluidtherapy;Moist Heat;Contrast Bath;Paraffin;Manual Therapy;Patient/family education;Passive range of motion;Scar mobilization;Therapeutic exercise;Splinting;DME and/or AE instruction    Consulted and Agree with Plan of Care Patient             Patient will benefit from skilled therapeutic intervention in order to improve the following deficits and impairments:   Body Structure / Function / Physical Skills: ADL, Strength, Dexterity, Pain, Edema, UE functional use, IADL, ROM, Scar mobility, Flexibility       Visit Diagnosis: Stiffness of right hand, not elsewhere classified  Muscle weakness (generalized)    Problem List Patient Active Problem List   Diagnosis Date Noted   Closed right trimalleolar fracture, initial encounter 01/17/2019    Rosalyn Gess, OTR/L,CLT 10/03/2022, 1:04 PM  [facility] [address] [place], [address] Phone: [phone]   Fax:  [phone]  Name: Alice  Taylor MRN: [id number] Date of Birth: 02-08-1961

## 2022-10-06 ENCOUNTER — Encounter: Payer: PRIVATE HEALTH INSURANCE | Admitting: Physical Therapy

## 2022-10-09 ENCOUNTER — Ambulatory Visit: Payer: PRIVATE HEALTH INSURANCE | Admitting: Occupational Therapy

## 2022-10-09 DIAGNOSIS — M25511 Pain in right shoulder: Secondary | ICD-10-CM | POA: Diagnosis not present

## 2022-10-09 DIAGNOSIS — M6281 Muscle weakness (generalized): Secondary | ICD-10-CM

## 2022-10-09 DIAGNOSIS — M25641 Stiffness of right hand, not elsewhere classified: Secondary | ICD-10-CM

## 2022-10-09 NOTE — Therapy (Signed)
[facility] [address], [place], [address] Phone: [phone]   Fax:  [phone]  Occupational Therapy Treatment  Patient Details  Name: Alice Taylor MRN: [id number] Date of Birth: February 26, 1961 Referring Provider (OT): Carlisle PA   Encounter Date: 10/09/2022   OT End of Session - 10/09/22 1144     Visit Number 14    Number of Visits 16    Date for OT Re-Evaluation 11/15/22    OT Start Time 1125    OT Stop Time 1200    OT Time Calculation (min) 35 min    Activity Tolerance Patient tolerated treatment well    Behavior During Therapy [facility] for tasks assessed/performed             Past Medical History:  Diagnosis Date   Cancer ([place]) 1970   Skin   GERD (gastroesophageal reflux disease)    History of kidney stones    PONV (postoperative nausea and vomiting)    x1 after orif ankle surgery    Past Surgical History:  Procedure Laterality Date   APPENDECTOMY     KNEE ARTHROSCOPY Right    KNEE ARTHROSCOPY Left 04/24/2018   Procedure: ARTHROSCOPY KNEE, PARTIAL MEDIAL MENISCECTOMY;  Surgeon: Dereck Leep, MD;  Location: [facility] ORS;  Service: Orthopedics;  Laterality: Left;   ORIF ANKLE FRACTURE Right 01/17/2019   Procedure: OPEN REDUCTION INTERNAL FIXATION (ORIF) ANKLE FRACTURE;  Surgeon: Hessie Knows, MD;  Location: [facility] ORS;  Service: Orthopedics;  Laterality: Right;   ORIF WRIST FRACTURE Right 02/21/2022   Procedure: OPEN REDUCTION INTERNAL FIXATION (ORIF) OR RIGHT DISTAL RADIUS FRACTURE.;  Surgeon: Corky Mull, MD;  Location: [facility] ORS;  Service: Orthopedics;  Laterality: Right;    There were no vitals filed for this visit.   Subjective Assessment - 10/09/22 1142     Subjective  Wearing cast at night time - stiff in the am - I think it looks better- what do you think -doing my stretches    Pertinent History Pt last seen by PA at Ortho on 04/03/22 - Alice Taylor is a 62 y.o. female who presents for follow-up now  6 weeks status post an open reduction and internal fixation of her right distal radius fracture with an ulnar styloid fracture. Overall, the patient feels that she is doing well. She still notes some discomfort in her wrist which she rates at 4/10 on today's visit, and for which she has been taking Tylenol and applying ice as necessary with temporary partial relief of her symptoms. She been wearing her Velcro wrist immobilizer on a regular basis, removing it for bathing purposes and for exercises. She still notes moderate stiffness in the wrist and fingers, as well as some swelling, but denies any reinjury to the hand or wrist. She also denies any fevers or chills, and denies any numbness or paresthesias to her fingers.Xray showed good healing at radius and ulnar and refer to therapy - pt has been doing PT at [facility] 3 x wk per pt the last month - pt refer to OT for digits stiffness and possible splint. 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  As well as painting and being able to decorate at different events.    Currently in Pain? No/denies                [facility] OT Assessment - 10/09/22 0001       Right Hand AROM   R Little PIP 0-100 --   -20 ext           Last week -28           OT Treatments/Exercises (OP) - 10/09/22 0001       RUE Paraffin   Number Minutes Paraffin 8 Minutes    RUE Paraffin Location Hand    Comments LMB splint on 5th PIP ext - prior to soft tissue               Patient to continue to focus on composite wrist and digit extension and flexion in the shower.  Doing table slides. Followed by addressing PIP extension using her LMB splint and paraffin to 3  times a day. Soft tissue massage in the volar digit done by OT Graston tool #2 for brushing with passive range of motion and gentle joint mobs to the PIP. Review and address PIP extension with MC at 90 degree flexion. Patient can do that several times during the day. Passive range of motion 0 in session. Recommended and done quick cast for PIP extension to use at nighttime. Cont with same HEP for 2 wks  Add rubber band for PIP extention -blocking MC at 0           OT Education - 10/09/22 1144     Education Details progress for wrist, HEP changes and use of LMB splint 5th PIP; and cast at nighttime    Person(s) Educated Patient    Methods Explanation;Demonstration;Tactile cues;Verbal cues;Handout    Comprehension Verbal cues required;Returned demonstration;Verbalized understanding              OT Short Term Goals - 09/20/22 1626       OT SHORT TERM GOAL #1   Title Patient to be independent in home program for active assisted range of motion for wrist and forearm in all planes to show progress to be able to upgrade patient to 2 pound weight.    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    Status Achieved      OT LONG TERM GOAL #4   Title Pt to be ind in HEP and use of splints /cast in if indicated to decrease flexor contracture less than 10 degrees to put  hand  in pocket and donn gloves without issues.    Baseline PIP extention at 5th -25 degrees- tight in PROM - joint enlarge -    Time 8    Period Weeks    Status New    Target Date 11/15/22      OT LONG TERM GOAL #5   Title Pt to be able to push up from bed and chair with no increase symptoms    Baseline Report earlier today wrist bother pt with wall pushup - in session after table slides pt had no issues    Time 4    Period Weeks    Status New    Target Date 10/18/22                   Plan - 10/09/22 1144     Clinical Impression Statement Patient was seen by this OT after her diagnosis of postop Colles' fracture R distal radius  ORIF on 02/21/22.  Pt made great progress at that time in about 6 visits but then had had another fall early Oct that resulted in a proximal humerus nondisplaced fracture.  SHe was in sling for while and  started working with PT for shoulder - and return to OT with increase stiffness in 5th digit PIP . Pt did in the past show some Cubital tunnel symptoms- tenderness and + Tinel. Decrease extetionat 5th PIP but improved to -20 this date compare to last week -28. Pt to cont with LMB splint to use during her paraffin and several times during day. Including PROM to PIP ext in lumbrical fist to avoid over stretching of volar plate at [facility]. Cont with extention cast for night time.  Cont to be limited  in  functional use of R dominant hand in ADL's and IADL's    OT Occupational Profile and History Problem Focused Assessment - Including review of records relating to presenting problem    Occupational performance deficits (Please refer to evaluation for details): ADL's;IADL's;Work;Play;Leisure;Social Participation    Body Structure / Function / Physical Skills ADL;Strength;Dexterity;Pain;Edema;UE functional use;IADL;ROM;Scar mobility;Flexibility    Rehab Potential Good    Clinical Decision Making Limited treatment options, no task modification necessary    Comorbidities  Affecting Occupational Performance: None    Modification or Assistance to Complete Evaluation  No modification of tasks or assist necessary to complete eval    OT Frequency Biweekly    OT Duration 6 weeks    OT Treatment/Interventions Self-care/ADL training;Fluidtherapy;Moist Heat;Contrast Bath;Paraffin;Manual Therapy;Patient/family education;Passive range of motion;Scar mobilization;Therapeutic exercise;Splinting;DME and/or AE instruction    Consulted and Agree with Plan of Care Patient             Patient will benefit from skilled therapeutic intervention in order to improve the following deficits and impairments:   Body Structure / Function / Physical Skills: ADL, Strength, Dexterity, Pain, Edema, UE functional use, IADL, ROM, Scar mobility, Flexibility       Visit Diagnosis: Stiffness of right hand, not elsewhere classified  Muscle weakness (generalized)    Problem List Patient Active Problem List   Diagnosis Date Noted   Closed right trimalleolar fracture, initial encounter 01/17/2019    Rosalyn Gess, OTR/L,CLT 10/09/2022, 12:35 PM  [facility] [address] [place], [address] Phone: [phone]   Fax:  [phone]  Name: Alice Taylor MRN: [id number] Date of Birth: [birth year]

## 2022-10-10 ENCOUNTER — Other Ambulatory Visit: Payer: Self-pay | Admitting: Student

## 2022-10-10 DIAGNOSIS — M7581 Other shoulder lesions, right shoulder: Secondary | ICD-10-CM

## 2022-10-10 DIAGNOSIS — S42291D Other displaced fracture of upper end of right humerus, subsequent encounter for fracture with routine healing: Secondary | ICD-10-CM

## 2022-10-11 ENCOUNTER — Ambulatory Visit: Payer: PRIVATE HEALTH INSURANCE | Admitting: Physical Therapy

## 2022-10-13 ENCOUNTER — Ambulatory Visit: Payer: PRIVATE HEALTH INSURANCE | Admitting: Physical Therapy

## 2022-10-18 ENCOUNTER — Ambulatory Visit: Payer: PRIVATE HEALTH INSURANCE | Admitting: Physical Therapy

## 2022-10-20 ENCOUNTER — Ambulatory Visit: Payer: PRIVATE HEALTH INSURANCE | Admitting: Physical Therapy

## 2022-10-23 ENCOUNTER — Ambulatory Visit
Admission: RE | Admit: 2022-10-23 | Discharge: 2022-10-23 | Disposition: A | Discharge status: Home / Self Care | Payer: PRIVATE HEALTH INSURANCE | Source: Ambulatory Visit | Attending: Student | Admitting: Student

## 2022-10-23 ENCOUNTER — Ambulatory Visit: Payer: PRIVATE HEALTH INSURANCE | Attending: Student | Admitting: Occupational Therapy

## 2022-10-23 DIAGNOSIS — M25641 Stiffness of right hand, not elsewhere classified: Secondary | ICD-10-CM

## 2022-10-23 DIAGNOSIS — M25511 Pain in right shoulder: Secondary | ICD-10-CM | POA: Insufficient documentation

## 2022-10-23 DIAGNOSIS — M25631 Stiffness of right wrist, not elsewhere classified: Secondary | ICD-10-CM

## 2022-10-23 DIAGNOSIS — M6281 Muscle weakness (generalized): Secondary | ICD-10-CM

## 2022-10-23 DIAGNOSIS — G8929 Other chronic pain: Secondary | ICD-10-CM | POA: Diagnosis present

## 2022-10-23 DIAGNOSIS — S42291D Other displaced fracture of upper end of right humerus, subsequent encounter for fracture with routine healing: Secondary | ICD-10-CM

## 2022-10-23 DIAGNOSIS — M7581 Other shoulder lesions, right shoulder: Secondary | ICD-10-CM

## 2022-10-23 NOTE — Therapy (Signed)
[facility] [address] [place], [address] Phone: [phone]   Fax:  [phone]  Occupational Therapy Treatment  Patient Details  Name: Alice Taylor MRN: [id number] Date of Birth: 10/12/1960 Referring Provider (OT): Enders PA   Encounter Date: 10/23/2022   OT End of Session - 10/23/22 1107     Visit Number 15    Number of Visits 16    Date for OT Re-Evaluation 11/15/22    OT Start Time 1107    OT Stop Time 1150    OT Time Calculation (min) 43 min    Activity Tolerance Patient tolerated treatment well    Behavior During Therapy [facility] for tasks assessed/performed             Past Medical History:  Diagnosis Date   Cancer ([place]) 1970   Skin   GERD (gastroesophageal reflux disease)    History of kidney stones    PONV (postoperative nausea and vomiting)    x1 after orif ankle surgery    Past Surgical History:  Procedure Laterality Date   APPENDECTOMY     KNEE ARTHROSCOPY Right    KNEE ARTHROSCOPY Left 04/24/2018   Procedure: ARTHROSCOPY KNEE, PARTIAL MEDIAL MENISCECTOMY;  Surgeon: Dereck Leep, MD;  Location: [facility] ORS;  Service: Orthopedics;  Laterality: Left;   ORIF ANKLE FRACTURE Right 01/17/2019   Procedure: OPEN REDUCTION INTERNAL FIXATION (ORIF) ANKLE FRACTURE;  Surgeon: Hessie Knows, MD;  Location: [facility] ORS;  Service: Orthopedics;  Laterality: Right;   ORIF WRIST FRACTURE Right 02/21/2022   Procedure: OPEN REDUCTION INTERNAL FIXATION (ORIF) OR RIGHT DISTAL RADIUS FRACTURE.;  Surgeon: Corky Mull, MD;  Location: [facility] ORS;  Service: Orthopedics;  Laterality: Right;    There were no vitals filed for this visit.   Subjective Assessment - 10/23/22 1107     Subjective  I could not remember the rubber band exercise - did had my MRI for shoulder this morning    Pertinent History Pt last seen by PA at Ortho on 04/03/22 - Alice Taylor is a 62 y.o. female who presents for follow-up now 6 weeks status post  an open reduction and internal fixation of her right distal radius fracture with an ulnar styloid fracture. Overall, the patient feels that she is doing well. She still notes some discomfort in her wrist which she rates at 4/10 on today's visit, and for which she has been taking Tylenol and applying ice as necessary with temporary partial relief of her symptoms. She been wearing her Velcro wrist immobilizer on a regular basis, removing it for bathing purposes and for exercises. She still notes moderate stiffness in the wrist and fingers, as well as some swelling, but denies any reinjury to the hand or wrist. She also denies any fevers or chills, and denies any numbness or paresthesias to her fingers.Xray showed good healing at radius and ulnar and refer to therapy - pt has been doing PT at [facility] 3 x wk per pt the last month - pt refer to OT for digits stiffness and possible splint. 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  As well as painting and being able to decorate at different events.    Currently in Pain? No/denies                [facility] OT Assessment - 10/23/22 0001       AROM   Right Forearm Pronation 90 Degrees    Right Forearm Supination 85 Degrees    Right Wrist Extension 80 Degrees    Right Wrist Flexion 80 Degrees      Right Hand AROM   R Little PIP 0-100 -18 Degrees   PROM 0           Pt with increase wrist AROM in all planes - to WNL except end range supination  Strength increase to 5-/5 end range supination  Pt cont to address 5th digit PIP extention           OT Treatments/Exercises (OP) - 10/23/22 0001       RUE Paraffin   Number Minutes Paraffin 8 Minutes     RUE Paraffin Location Hand    Comments LMB splint for 5th PIP ext stretch                Patient to continue to focus on composite  digit extension and flexion in the shower.  Cont to do  table slides for wrist extention Followed by addressing PIP extension using her LMB splint and paraffin - 2- 3 times a day. Soft tissue massage in the volar digit done by OT Graston tool #2 for brushing with passive range of motion and gentle joint mobs to the PIP. Review and address PIP extension with MC at 90 degree flexion. Patient can do that several times during the day. Passive range of motion 0 in session. Cont quick cast for PIP extension to use at nighttime. Cont with same HEP for 3 wks  Add rubber band for PIP extention -blocking MC at 0   Pt did not do last exercise the last 2 wks         OT Education - 10/23/22 1107     Education Details progress for wrist, HEP changes and use of LMB splint 5th PIP; and cast at nighttime    Person(s) Educated Patient    Methods Explanation;Demonstration;Tactile cues;Verbal cues;Handout    Comprehension Verbal cues required;Returned demonstration;Verbalized understanding              OT Short Term Goals - 09/20/22 1626       OT SHORT TERM GOAL #1   Title Patient to be independent in home program for active assisted range of motion for wrist and forearm in all planes to show progress to be able to upgrade patient to 2 pound weight.    Status Achieved               OT Long Term Goals - 09/20/22 1626       OT LONG TERM GOAL #1   Title Right wrist and forearm active range of motion increased to within functional limits for patient to turn a doorknob as well as bath and dress using R hand more than 50%    Status Achieved      OT LONG TERM GOAL #2   Title Right composite fist improved for patient to be able to touch palm to cut food do hair    Status Achieved      OT LONG TERM GOAL #3   Title Right grip and prehension strength  improved to more  than 75% compared to left hand to be able to carry more than 5 pounds as well as 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    Status Achieved      OT LONG TERM GOAL #4   Title Pt to be ind in HEP and use of splints /cast in if indicated to decrease flexor contracture less than 10 degrees to put hand in pocket and donn gloves without issues.    Baseline PIP extention at 5th -25 degrees- tight in PROM - joint enlarge -    Time 8    Period Weeks    Status New    Target Date 11/15/22      OT LONG TERM GOAL #5   Title Pt to be able to push up from bed and chair with no increase symptoms    Baseline Report earlier today wrist bother pt with wall pushup - in session after table slides pt had no issues    Time 4    Period Weeks    Status New    Target Date 10/18/22                   Plan - 10/23/22 1108     Clinical Impression Statement Patient was seen by this OT after her diagnosis of postop Colles' fracture R distal radius  ORIF on 02/21/22.  Pt made great progress at that time in about 6 visits but then had had another fall early Oct that resulted in a proximal humerus nondisplaced fracture.  SHe was in sling for while and  started working with PT for shoulder - and return to OT with increase stiffness in 5th digit PIP . Pt did in the past show some Cubital tunnel symptoms- tenderness and + Tinel. Decrease extetion at 5th PIP but improved to -18  compare 2 sessions ago  -28. Pt to cont with LMB splint to use during her paraffin and several times during day. Including PROM to PIP ext in lumbrical fist to avoid over stretching of volar plate at [facility]. Cont with extention cast for night time. Can follow up in 3 wks.  Cont to be limited  in  functional use of R dominant hand in ADL's and IADL's    OT Occupational Profile and History Problem Focused Assessment - Including review of records relating to presenting problem    Occupational performance deficits  (Please refer to evaluation for details): ADL's;IADL's;Work;Play;Leisure;Social Participation    Body Structure / Function / Physical Skills ADL;Strength;Dexterity;Pain;Edema;UE functional use;IADL;ROM;Scar mobility;Flexibility    Rehab Potential Good    Clinical Decision Making Limited treatment options, no task modification necessary    Comorbidities Affecting Occupational Performance: None    Modification or Assistance to Complete Evaluation  No modification of tasks or assist necessary to complete eval    OT Frequency --   3 wks   OT Duration 6 weeks    OT Treatment/Interventions Self-care/ADL training;Fluidtherapy;Moist Heat;Contrast Bath;Paraffin;Manual Therapy;Patient/family education;Passive range of motion;Scar mobilization;Therapeutic exercise;Splinting;DME and/or AE instruction    Consulted and Agree with Plan of Care Patient             Patient will benefit from skilled therapeutic intervention in order to improve the following deficits and impairments:   Body Structure / Function / Physical Skills: ADL, Strength, Dexterity, Pain, Edema, UE functional use, IADL, ROM, Scar mobility, Flexibility       Visit Diagnosis: Stiffness of right hand, not elsewhere classified  Muscle weakness (generalized)  Stiffness of right wrist, not elsewhere classified    Problem List Patient Active Problem List   Diagnosis Date Noted   Closed right trimalleolar fracture, initial encounter 01/17/2019    Rosalyn Gess, OTR/L,CLT 10/23/2022, 12:50 PM  [facility] [address] [place], [address] Phone: [phone]   Fax:  [phone]  Name: Alice Taylor MRN: [id number] Date of Birth: 09-02-1961

## 2022-10-31 ENCOUNTER — Ambulatory Visit: Payer: PRIVATE HEALTH INSURANCE

## 2022-10-31 DIAGNOSIS — M25641 Stiffness of right hand, not elsewhere classified: Secondary | ICD-10-CM | POA: Diagnosis not present

## 2022-10-31 DIAGNOSIS — G8929 Other chronic pain: Secondary | ICD-10-CM

## 2022-10-31 DIAGNOSIS — M6281 Muscle weakness (generalized): Secondary | ICD-10-CM

## 2022-10-31 NOTE — Therapy (Signed)
OUTPATIENT PHYSICAL THERAPY UPPER EXTREMITY EVALUATION   Patient Name: Alice Taylor MRN: [id number] DOB:23-Oct-1960, 62 y.o., female Today's Date: 10/31/2022  END OF SESSION:  PT End of Session - 10/31/22 1343     Visit Number 1    Number of Visits 17    Date for PT Re-Evaluation 12/26/22    PT Start Time 1345    PT Stop Time 1430    PT Time Calculation (min) 45 min    Activity Tolerance Patient tolerated treatment well    Behavior During Therapy [facility] for tasks assessed/performed             Past Medical History:  Diagnosis Date   Cancer ([place]) 1970   Skin   GERD (gastroesophageal reflux disease)    History of kidney stones    PONV (postoperative nausea and vomiting)    x1 after orif ankle surgery   Past Surgical History:  Procedure Laterality Date   APPENDECTOMY     KNEE ARTHROSCOPY Right    KNEE ARTHROSCOPY Left 04/24/2018   Procedure: ARTHROSCOPY KNEE, PARTIAL MEDIAL MENISCECTOMY;  Surgeon: Dereck Leep, MD;  Location: [facility] ORS;  Service: Orthopedics;  Laterality: Left;   ORIF ANKLE FRACTURE Right 01/17/2019   Procedure: OPEN REDUCTION INTERNAL FIXATION (ORIF) ANKLE FRACTURE;  Surgeon: Hessie Knows, MD;  Location: [facility] ORS;  Service: Orthopedics;  Laterality: Right;   ORIF WRIST FRACTURE Right 02/21/2022   Procedure: OPEN REDUCTION INTERNAL FIXATION (ORIF) OR RIGHT DISTAL RADIUS FRACTURE.;  Surgeon: Corky Mull, MD;  Location: [facility] ORS;  Service: Orthopedics;  Laterality: Right;   Patient Active Problem List   Diagnosis Date Noted   Closed right trimalleolar fracture, initial encounter 01/17/2019    PCP: Dolleschel, Dyann Kief PROVIDER: Cameron Proud   REFERRING DIAG: Fx Shoulder, Right  THERAPY DIAG:  Chronic pain in right shoulder  Muscle weakness (generalized)  Rationale for Evaluation and Treatment: Rehabilitation  ONSET DATE: Fx Shoulder, Right  SUBJECTIVE:                                                                                                                                                                                       SUBJECTIVE STATEMENT: Pt is known to clinic returning due to R shoulder pain and limited AROM due to difficulty healing of R proximal humerus fracture. Worst pain 5/10 NPS, is sometimes pain free. Currently just soreness. Still having difficulty with overhead ADL's like doing her hair, difficulty getting in and out her her tub, standing up from the floor.   PERTINENT HISTORY: Pt is known to clinic returning due to R shoulder pain and limited AROM due to difficulty healing of  R proximal humerus fracture. Pt reports she was unable to get her R shoulder manipulation as her MRI revealed her fracture has yet to heal. So per the orthopedic PA, the plan is to trial bone stimulator and additional PT and reassess after bone stimulation is complete. Worst pain 5/10 NPS, is sometimes pain free. Currently just soreness. Still having difficulty with overhead ADL's like doing her hair, difficulty getting in and out her her tub, standing up from the floor. Reports no specific limitations or precautions to her R shoulder. 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   PAIN:  Are you having pain? Yes: NPRS scale: 1/10 Pain location: R shoulder Pain description: Dull/achey Aggravating factors: Overhead motion Relieving factors: rest  PRECAUTIONS: None  WEIGHT BEARING RESTRICTIONS: No  FALLS:  Has patient fallen in last 6 months? 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  PLOF: Independent  PATIENT GOALS: Improve her R shoulder AROm  NEXT MD VISIT: N/A  OBJECTIVE:   DIAGNOSTIC FINDINGS:  CLINICAL DATA:  Right proximal humerus fracture follow-up.   EXAM: MRI OF THE RIGHT SHOULDER WITHOUT CONTRAST   TECHNIQUE: Multiplanar, multisequence MR imaging of the shoulder was  performed. No intravenous contrast was administered.   COMPARISON:  Right shoulder x-rays dated June 19, 2022.   FINDINGS: Rotator cuff: Moderate supraspinatus tendinosis with bursal surface fraying. Mild subscapularis tendinosis.   Muscles: No atrophy or abnormal signal of the muscles of the rotator cuff.   Biceps long head: Intact and normally positioned. Mild intra-articular tendinosis.   Acromioclavicular Joint: Moderate arthropathy of the acromioclavicular joint. Trace fluid in the subacromial/subdeltoid bursa.   Glenohumeral Joint: No joint effusion. No chondral defect.   Labrum: Grossly intact, but evaluation is limited by lack of intraarticular fluid.   Bones: Chronic, partially healed fracture of the proximal humerus surgical neck. Impaction measures up to 1.2 cm. Slight internal rotation and abduction of the humeral head. Continued marrow edema around the fracture site. No dislocation. No suspicious bone lesion.   Other: None.   IMPRESSION: 1. Chronic, partially healed impacted fracture of the proximal humerus surgical neck. 2. Moderate supraspinatus tendinosis with bursal surface fraying. No high-grade rotator cuff tear. 3. Mild subscapularis and intra-articular biceps tendinosis. 4. Moderate acromioclavicular osteoarthritis.     Electronically Signed   By: Titus Dubin M.D.   On: 10/23/2022 14:16  PATIENT SURVEYS :  FOTO 45 with target of 66  Quick DASH: 47.7% COGNITION: Overall cognitive status: Within functional limits for tasks assessed      POSTURE: Resting in sitting with R upper trap over activation.   UPPER EXTREMITY ROM:   Apley's functional ER: R thumb to CTJ  Apley's functional IR: R thumb to PSIS  Active ROM Right eval Left eval  Shoulder flexion 106* 160  Shoulder extension 20* 75  Shoulder abduction 82* 130  Shoulder adduction    Shoulder internal rotation 90 90  Shoulder external rotation 31* 90  Elbow flexion     Elbow extension    Wrist flexion    Wrist extension    Wrist ulnar deviation    Wrist radial deviation    Wrist pronation    Wrist supination    (Blank rows = not tested)  Cervical AROM: 40 d ext, 70d flx; Lateral flx - R: 30d, L: 45d; Rotation - R: 55d, L:  45d  Passive ROM Right eval Left eval  Shoulder flexion 128   Shoulder extension    Shoulder abduction 98   Shoulder adduction    Shoulder internal rotation    Shoulder external rotation 40   Elbow flexion    Elbow extension    Wrist flexion    Wrist extension    Wrist ulnar deviation    Wrist radial deviation    Wrist pronation    Wrist supination    (Blank rows = not tested)  UPPER EXTREMITY MMT:  MMT Right eval Left eval  Shoulder flexion 5 5  Shoulder extension 3+ 4  Shoulder abduction 4 4  Shoulder adduction    Shoulder internal rotation 4 5  Shoulder external rotation 3 4  Middle trapezius 5 5  Lower trapezius 4+ 5  Elbow flexion    Elbow extension    Wrist flexion    Wrist extension    Wrist ulnar deviation    Wrist radial deviation    Wrist pronation    Wrist supination    Grip strength (lbs)    (Blank rows = not tested)  SHOULDER SPECIAL TESTS: Not tested  JOINT MOBILITY TESTING:  Deferred due to partially healed proximal humerus fracture at surgical neck.   PALPATION:  TTP along incision, has painful area near long head of biceps tendon where pt has reported scar tissue from healing fracture.    TODAY'S TREATMENT:                                                                                                                                         DATE: 10/31/22  Educated on maintaining overhead AROM exercises with holds to improve overhead strength  PATIENT EDUCATION: Education details: POC, HEP Person educated: Patient Education method: Explanation Education comprehension: verbalized understanding  HOME EXERCISE PROGRAM: Plan for primary PT to update as  needed.  ASSESSMENT:  CLINICAL IMPRESSION: Patient is a 62 y.o. female who was seen today for physical therapy evaluation and treatment for R shoulder pain due to chronic humerus fracture. Pt presents with R shoulder weakness, limited AROM and PROM (PROM > AROM) preventing pt from fully completing overhead and behind back ADL completion such as bathing, dressing, standing up from the floor or lowered surfaces. Pt's AROM of her shoulder is  improved from previous evaluation thus indicating capacity to improve in her function. Pt demonstrates adequate R shoulder joint mobility to attain > 120 degrees overhead flexion to complete ADL's but is limited due to weakness and pain. Pt will benefit from skilled PT services to address her R shoulder deficits to optimize ability to complete needed overhead ADL's and job tasks.    OBJECTIVE IMPAIRMENTS: decreased mobility, decreased ROM, impaired flexibility, impaired UE functional use, improper body mechanics, postural dysfunction, and pain.   ACTIVITY LIMITATIONS: carrying, lifting, sleeping, transfers, bed mobility, bathing, dressing, reach over head, and hygiene/grooming  PARTICIPATION LIMITATIONS: community activity, occupation, and yard work  PERSONAL FACTORS: Age, Past/current experiences, Profession, Time since onset of injury/illness/exacerbation, and 1 comorbidity: Hx of skin cancer  are also affecting patient's functional outcome.   REHAB POTENTIAL: Fair Chronicity of symptoms  CLINICAL DECISION MAKING: Evolving/moderate complexity  EVALUATION COMPLEXITY: Moderate  GOALS: Goals reviewed with patient? No  SHORT TERM GOALS: Target date: 11/28/22  Pt will be independent with HEP to improve R shoulder strength and  mobility to improve ability to complete overhead ADL's Baseline: 10/31/22: Needs updated HEP Goal status: INITIAL  LONG TERM GOALS: Target date: 12/26/22  Pt will improve FOTO to target score to demonstrate clinically significant  improvement in functional mobility.  Baseline: 10/31/22: 45 with target score of 66 Goal status: INITIAL  2.  Pt will decrease her QuickDash score by at least 10% to demonstrate clinically significant improvement in perceived use of RUE with ADL's and reduced disability. Baseline: 10/31/22: 47.7% Goal status: INITIAL  3.  Pt will improve R shoulder flexion and abduction to at least 120 degrees to demonstrate ability to complete overhead ADL's like bathing and dressing. Baseline: 10/31/22: flex: 106 deg, abduction: 82 deg Goal status: INITIAL  PLAN: PT FREQUENCY: 1-2x/week  PT DURATION: 8 weeks  PLANNED INTERVENTIONS: Therapeutic exercises, Therapeutic activity, Neuromuscular re-education, Patient/Family education, Self Care, Joint mobilization, Aquatic Therapy, Dry Needling, Electrical stimulation, Spinal manipulation, Spinal mobilization, Cryotherapy, Moist heat, Manual therapy, and Re-evaluation  PLAN FOR NEXT SESSION: Update HEP, R shoulder mobility and strengthening   Salem Caster. Fairly IV, PT, DPT Physical Therapist- [facility]  10/31/2022, 2:35 PM

## 2022-11-07 ENCOUNTER — Ambulatory Visit: Payer: PRIVATE HEALTH INSURANCE | Admitting: Physical Therapy

## 2022-11-07 ENCOUNTER — Encounter: Payer: Self-pay | Admitting: Physical Therapy

## 2022-11-07 DIAGNOSIS — G8929 Other chronic pain: Secondary | ICD-10-CM

## 2022-11-07 DIAGNOSIS — M25641 Stiffness of right hand, not elsewhere classified: Secondary | ICD-10-CM | POA: Diagnosis not present

## 2022-11-07 NOTE — Therapy (Signed)
OUTPATIENT PHYSICAL THERAPY UPPER EXTREMITY EVALUATION   Patient Name: Alice Taylor MRN: [id number] DOB:Aug 25, 1961, 62 y.o., female Today's Date: 11/07/2022  END OF SESSION:  PT End of Session - 11/07/22 1137     Visit Number 2    Number of Visits 17    Date for PT Re-Evaluation 12/26/22    PT Start Time 1130    PT Stop Time 1210    PT Time Calculation (min) 40 min    Activity Tolerance Patient tolerated treatment well    Behavior During Therapy [facility] for tasks assessed/performed              Past Medical History:  Diagnosis Date   Cancer ([place]) 1970   Skin   GERD (gastroesophageal reflux disease)    History of kidney stones    PONV (postoperative nausea and vomiting)    x1 after orif ankle surgery   Past Surgical History:  Procedure Laterality Date   APPENDECTOMY     KNEE ARTHROSCOPY Right    KNEE ARTHROSCOPY Left 04/24/2018   Procedure: ARTHROSCOPY KNEE, PARTIAL MEDIAL MENISCECTOMY;  Surgeon: Dereck Leep, MD;  Location: [facility] ORS;  Service: Orthopedics;  Laterality: Left;   ORIF ANKLE FRACTURE Right 01/17/2019   Procedure: OPEN REDUCTION INTERNAL FIXATION (ORIF) ANKLE FRACTURE;  Surgeon: Hessie Knows, MD;  Location: [facility] OR[facility];  Service: Orthopedics;  Laterality: Right;   ORIF WRIST FRACTURE Right 02/21/2022   Procedure: OPEN REDUCTION INTERNAL FIXATION (ORIF) OR RIGHT DISTAL RADIUS FRACTURE.;  Surgeon: Corky Mull, MD;  Location: [facility] OR[facility];  Service: Orthopedics;  Laterality: Right;   Patient Active Problem List   Diagnosis Date Noted   Closed right trimalleolar fracture, initial encounter 01/17/2019    PCP: Dolleschel, Dyann Kief PROVIDER: Cameron Proud   REFERRING DIAG: Fx Shoulder, Right  THERAPY DIAG:  Chronic pain in right shoulder  Rationale for Evaluation and Treatment: Rehabilitation  ONSET DATE: Fx Shoulder, Right  SUBJECTIVE:                                                                                                                                                                                       SUBJECTIVE STATEMENT: NPS: 5/10. HEP is going well.   Pt is known to clinic returning due to R shoulder pain and limited AROM due to difficulty healing of R proximal humerus fracture. Worst pain 5/10 NPS, is sometimes pain free. Currently just soreness. Still having difficulty with overhead ADL's like doing her hair, difficulty getting in and out her her tub, standing up from the floor.   PERTINENT HISTORY: Pt is known to clinic returning due to R shoulder pain and limited AROM  due to difficulty healing of R proximal humerus fracture. Pt reports she was unable to get her R shoulder manipulation as her MRI revealed her fracture has yet to heal. So per the orthopedic PA, the plan is to trial bone stimulator and additional PT and reassess after bone stimulation is complete. Worst pain 5/10 NPS, is sometimes pain free. Currently just soreness. Still having difficulty with overhead ADL's like doing her hair, difficulty getting in and out her her tub, standing up from the floor. Reports no specific limitations or precautions to her R shoulder. 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   PAIN:  Are you having pain? Yes: NPRS scale: 1/10 Pain location: R shoulder Pain description: Dull/achey Aggravating factors: Overhead motion Relieving factors: rest  PRECAUTIONS: None  WEIGHT BEARING RESTRICTIONS: No  FALLS:  Has patient fallen in last 6 months? 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  PLOF: Independent  PATIENT GOALS: Improve her R shoulder AROm  NEXT MD VISIT: N/A  OBJECTIVE:   DIAGNOSTIC FINDINGS:  CLINICAL DATA:  Right proximal humerus fracture follow-up.   EXAM: MRI OF THE RIGHT SHOULDER WITHOUT CONTRAST   TECHNIQUE: Multiplanar, multisequence MR imaging of the shoulder was  performed. No intravenous contrast was administered.   COMPARISON:  Right shoulder x-rays dated June 19, 2022.   FINDINGS: Rotator cuff: Moderate supraspinatus tendinosis with bursal surface fraying. Mild subscapularis tendinosis.   Muscles: No atrophy or abnormal signal of the muscles of the rotator cuff.   Biceps long head: Intact and normally positioned. Mild intra-articular tendinosis.   Acromioclavicular Joint: Moderate arthropathy of the acromioclavicular joint. Trace fluid in the subacromial/subdeltoid bursa.   Glenohumeral Joint: No joint effusion. No chondral defect.   Labrum: Grossly intact, but evaluation is limited by lack of intraarticular fluid.   Bones: Chronic, partially healed fracture of the proximal humerus surgical neck. Impaction measures up to 1.2 cm. Slight internal rotation and abduction of the humeral head. Continued marrow edema around the fracture site. No dislocation. No suspicious bone lesion.   Other: None.   IMPRESSION: 1. Chronic, partially healed impacted fracture of the proximal humerus surgical neck. 2. Moderate supraspinatus tendinosis with bursal surface fraying. No high-grade rotator cuff tear. 3. Mild subscapularis and intra-articular biceps tendinosis. 4. Moderate acromioclavicular osteoarthritis.     Electronically Signed   By: Titus Dubin M.D.   On: 10/23/2022 14:16  PATIENT SURVEYS :  FOTO 45 with target of 66  Quick DASH: 47.7% COGNITION: Overall cognitive status: Within functional limits for tasks assessed      POSTURE: Resting in sitting with R upper trap over activation.   UPPER EXTREMITY ROM:   Apley's functional ER: R thumb to CTJ  Apley's functional IR: R thumb to PSIS  Active ROM Right eval Left eval  Shoulder flexion 106* 160  Shoulder extension 20* 75  Shoulder abduction 82* 130  Shoulder adduction    Shoulder internal rotation 90 90  Shoulder external rotation 31* 90  Elbow flexion     Elbow extension    Wrist flexion    Wrist extension    Wrist ulnar deviation    Wrist radial deviation    Wrist pronation    Wrist supination    (Blank rows = not tested)  Cervical AROM: 40 d ext, 70d flx; Lateral flx - R: 30d, L: 45d;  Rotation - R: 55d, L: 45d  Passive ROM Right eval Left eval  Shoulder flexion 128   Shoulder extension    Shoulder abduction 98   Shoulder adduction    Shoulder internal rotation    Shoulder external rotation 40   Elbow flexion    Elbow extension    Wrist flexion    Wrist extension    Wrist ulnar deviation    Wrist radial deviation    Wrist pronation    Wrist supination    (Blank rows = not tested)  UPPER EXTREMITY MMT:  MMT Right eval Left eval  Shoulder flexion 5 5  Shoulder extension 3+ 4  Shoulder abduction 4 4  Shoulder adduction    Shoulder internal rotation 4 5  Shoulder external rotation 3 4  Middle trapezius 5 5  Lower trapezius 4+ 5  Elbow flexion    Elbow extension    Wrist flexion    Wrist extension    Wrist ulnar deviation    Wrist radial deviation    Wrist pronation    Wrist supination    Grip strength (lbs)    (Blank rows = not tested)  SHOULDER SPECIAL TESTS: Not tested  JOINT MOBILITY TESTING:  Deferred due to partially healed proximal humerus fracture at surgical neck.   PALPATION:  TTP along incision, has painful area near long head of biceps tendon where pt has reported scar tissue from healing fracture.    TODAY'S TREATMENT:                                                                                                                                         DATE: 11/07/22 Therex:  - UBE seat 10 L3 2.5 min fwd; 2.5 min bwd for RUE gentle strengthening and motion - Pulleys IR AAROM x 12 - Seated AROM flex - Seated AROM abd with LUE behind back x 12  - Shoulder shrug with 10# DB x 12 with good carry over - Prone row 2x 6# DB x 12 - Prone overhead flex x 12 - I's 3# DB x 12 - T's 3# DB x 12 -  Doorway low trap stretch 1 x 30 secs  PATIENT EDUCATION: Education details: POC, HEP Person educated: Patient Education method: Explanation Education comprehension: verbalized understanding  HOME EXERCISE PROGRAM: Plan for primary PT to update as needed.  ASSESSMENT:  CLINICAL IMPRESSION: PT initiated therex progression for increased strength, mobility, scapulohumeral rhythm, and pain reduction with success. Pt is able to comply with all cuing for proper form and technique of therex with most cuing needed for scapular movement. PT will continue progression as able. Pt will continue to benefit from skilled PT to address the aforementioned R shoulder deficits to optimize ability to complete needed overhead ADL's and job tasks.   OBJECTIVE IMPAIRMENTS: decreased mobility, decreased ROM, impaired flexibility, impaired UE functional use, improper body mechanics, postural dysfunction, and  pain.   ACTIVITY LIMITATIONS: carrying, lifting, sleeping, transfers, bed mobility, bathing, dressing, reach over head, and hygiene/grooming  PARTICIPATION LIMITATIONS: community activity, occupation, and yard work  PERSONAL FACTORS: Age, Past/current experiences, Profession, Time since onset of injury/illness/exacerbation, and 1 comorbidity: Hx of skin cancer  are also affecting patient's functional outcome.   REHAB POTENTIAL: Fair Chronicity of symptoms  CLINICAL DECISION MAKING: Evolving/moderate complexity  EVALUATION COMPLEXITY: Moderate  GOALS: Goals reviewed with patient? No  SHORT TERM GOALS: Target date: 11/28/22  Pt will be independent with HEP to improve R shoulder strength and  mobility to improve ability to complete overhead ADL's Baseline: 10/31/22: Needs updated HEP Goal status: INITIAL  LONG TERM GOALS: Target date: 12/26/22  Pt will improve FOTO to target score to demonstrate clinically significant improvement in functional mobility.  Baseline: 10/31/22: 45 with target score of  66 Goal status: INITIAL  2.  Pt will decrease her QuickDash score by at least 10% to demonstrate clinically significant improvement in perceived use of RUE with ADL's and reduced disability. Baseline: 10/31/22: 47.7% Goal status: INITIAL  3.  Pt will improve R shoulder flexion and abduction to at least 120 degrees to demonstrate ability to complete overhead ADL's like bathing and dressing. Baseline: 10/31/22: flex: 106 deg, abduction: 82 deg Goal status: INITIAL  PLAN: PT FREQUENCY: 1-2x/week  PT DURATION: 8 weeks  PLANNED INTERVENTIONS: Therapeutic exercises, Therapeutic activity, Neuromuscular re-education, Patient/Family education, Self Care, Joint mobilization, Aquatic Therapy, Dry Needling, Electrical stimulation, Spinal manipulation, Spinal mobilization, Cryotherapy, Moist heat, Manual therapy, and Re-evaluation  PLAN FOR NEXT SESSION: Update HEP, R shoulder mobility and strengthening   Salem Caster. Fairly IV, PT, DPT Physical Therapist- [facility]  11/07/2022, 11:37 AM

## 2022-11-09 ENCOUNTER — Encounter: Payer: Self-pay | Admitting: Physical Therapy

## 2022-11-09 ENCOUNTER — Ambulatory Visit: Payer: PRIVATE HEALTH INSURANCE | Admitting: Physical Therapy

## 2022-11-09 DIAGNOSIS — M25641 Stiffness of right hand, not elsewhere classified: Secondary | ICD-10-CM | POA: Diagnosis not present

## 2022-11-09 DIAGNOSIS — G8929 Other chronic pain: Secondary | ICD-10-CM

## 2022-11-09 NOTE — Therapy (Signed)
OUTPATIENT PHYSICAL THERAPY UPPER EXTREMITY EVALUATION   Patient Name: Alice Taylor MRN: [id number] DOB:22-Aug-1961, 62 y.o., female Today's Date: 11/09/2022  END OF SESSION:  PT End of Session - 11/09/22 1313     Visit Number 3    Number of Visits 17    Date for PT Re-Evaluation 12/26/22    PT Start Time 1315    PT Stop Time 1345    PT Time Calculation (min) 30 min    Activity Tolerance Patient tolerated treatment well    Behavior During Therapy [facility] for tasks assessed/performed               Past Medical History:  Diagnosis Date   Cancer ([place]) 1970   Skin   GERD (gastroesophageal reflux disease)    History of kidney stones    PONV (postoperative nausea and vomiting)    x1 after orif ankle surgery   Past Surgical History:  Procedure Laterality Date   APPENDECTOMY     KNEE ARTHROSCOPY Right    KNEE ARTHROSCOPY Left 04/24/2018   Procedure: ARTHROSCOPY KNEE, PARTIAL MEDIAL MENISCECTOMY;  Surgeon: Dereck Leep, MD;  Location: [facility] ORS;  Service: Orthopedics;  Laterality: Left;   ORIF ANKLE FRACTURE Right 01/17/2019   Procedure: OPEN REDUCTION INTERNAL FIXATION (ORIF) ANKLE FRACTURE;  Surgeon: Hessie Knows, MD;  Location: [facility] ORS;  Service: Orthopedics;  Laterality: Right;   ORIF WRIST FRACTURE Right 02/21/2022   Procedure: OPEN REDUCTION INTERNAL FIXATION (ORIF) OR RIGHT DISTAL RADIUS FRACTURE.;  Surgeon: Corky Mull, MD;  Location: [facility] ORS;  Service: Orthopedics;  Laterality: Right;   Patient Active Problem List   Diagnosis Date Noted   Closed right trimalleolar fracture, initial encounter 01/17/2019    PCP: Dolleschel, Dyann Kief PROVIDER: Cameron Proud   REFERRING DIAG: Fx Shoulder, Right  THERAPY DIAG:  Chronic pain in right shoulder  Rationale for Evaluation and Treatment: Rehabilitation  ONSET DATE: Fx Shoulder, Right  SUBJECTIVE:                                                                                                                                                                                       SUBJECTIVE STATEMENT: Pt reports that nothing has changed since last session in regard to her R shoulder severity and frequency. Pt states she is "frustrated" of the delayed healing of R proximal humerus fracture but determined to get better. Pt experienced muscle soreness following last session but it subsided after one day. HEP is continuing to go well. NPS: 3/10 currently.   PERTINENT HISTORY: Pt is known to clinic returning due to R shoulder pain and limited AROM due to difficulty healing of  R proximal humerus fracture. Pt reports she was unable to get her R shoulder manipulation as her MRI revealed her fracture has yet to heal. So per the orthopedic PA, the plan is to trial bone stimulator and additional PT and reassess after bone stimulation is complete. Worst pain 5/10 NPS, is sometimes pain free. Currently just soreness. Still having difficulty with overhead ADL's like doing her hair, difficulty getting in and out her her tub, standing up from the floor. Reports no specific limitations or precautions to her R shoulder. 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   PAIN:  Are you having pain? Yes: NPRS scale: 1/10 Pain location: R shoulder Pain description: Dull/achey Aggravating factors: Overhead motion Relieving factors: rest  PRECAUTIONS: None  WEIGHT BEARING RESTRICTIONS: No  FALLS:  Has patient fallen in last 6 months? 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  PLOF: Independent  PATIENT GOALS: Improve her R shoulder AROm  NEXT MD VISIT: N/A  OBJECTIVE:   DIAGNOSTIC FINDINGS:  CLINICAL DATA:  Right proximal humerus fracture follow-up.   EXAM: MRI OF THE RIGHT SHOULDER WITHOUT CONTRAST   TECHNIQUE: Multiplanar, multisequence MR imaging of the shoulder was  performed. No intravenous contrast was administered.   COMPARISON:  Right shoulder x-rays dated June 19, 2022.   FINDINGS: Rotator cuff: Moderate supraspinatus tendinosis with bursal surface fraying. Mild subscapularis tendinosis.   Muscles: No atrophy or abnormal signal of the muscles of the rotator cuff.   Biceps long head: Intact and normally positioned. Mild intra-articular tendinosis.   Acromioclavicular Joint: Moderate arthropathy of the acromioclavicular joint. Trace fluid in the subacromial/subdeltoid bursa.   Glenohumeral Joint: No joint effusion. No chondral defect.   Labrum: Grossly intact, but evaluation is limited by lack of intraarticular fluid.   Bones: Chronic, partially healed fracture of the proximal humerus surgical neck. Impaction measures up to 1.2 cm. Slight internal rotation and abduction of the humeral head. Continued marrow edema around the fracture site. No dislocation. No suspicious bone lesion.   Other: None.   IMPRESSION: 1. Chronic, partially healed impacted fracture of the proximal humerus surgical neck. 2. Moderate supraspinatus tendinosis with bursal surface fraying. No high-grade rotator cuff tear. 3. Mild subscapularis and intra-articular biceps tendinosis. 4. Moderate acromioclavicular osteoarthritis.     Electronically Signed   By: Titus Dubin M.D.   On: 10/23/2022 14:16  PATIENT SURVEYS :  FOTO 45 with target of 66  Quick DASH: 47.7% COGNITION: Overall cognitive status: Within functional limits for tasks assessed      POSTURE: Resting in sitting with R upper trap over activation.   UPPER EXTREMITY ROM:   Apley's functional ER: R thumb to CTJ  Apley's functional IR: R thumb to PSIS  Active ROM Right eval Left eval  Shoulder flexion 106* 160  Shoulder extension 20* 75  Shoulder abduction 82* 130  Shoulder adduction    Shoulder internal rotation 90 90  Shoulder external rotation 31* 90  Elbow flexion     Elbow extension    Wrist flexion    Wrist extension    Wrist ulnar deviation    Wrist radial deviation    Wrist pronation    Wrist supination    (Blank rows = not tested)  Cervical AROM: 40 d ext, 70d flx; Lateral flx - R: 30d, L: 45d; Rotation - R: 55d, L:  45d  Passive ROM Right eval Left eval  Shoulder flexion 128   Shoulder extension    Shoulder abduction 98   Shoulder adduction    Shoulder internal rotation    Shoulder external rotation 40   Elbow flexion    Elbow extension    Wrist flexion    Wrist extension    Wrist ulnar deviation    Wrist radial deviation    Wrist pronation    Wrist supination    (Blank rows = not tested)  UPPER EXTREMITY MMT:  MMT Right eval Left eval  Shoulder flexion 5 5  Shoulder extension 3+ 4  Shoulder abduction 4 4  Shoulder adduction    Shoulder internal rotation 4 5  Shoulder external rotation 3 4  Middle trapezius 5 5  Lower trapezius 4+ 5  Elbow flexion    Elbow extension    Wrist flexion    Wrist extension    Wrist ulnar deviation    Wrist radial deviation    Wrist pronation    Wrist supination    Grip strength (lbs)    (Blank rows = not tested)  SHOULDER SPECIAL TESTS: Not tested  JOINT MOBILITY TESTING:  Deferred due to partially healed proximal humerus fracture at surgical neck.   PALPATION:  TTP along incision, has painful area near long head of biceps tendon where pt has reported scar tissue from healing fracture.    TODAY'S TREATMENT:                                                                                                                                         DATE: 11/09/22 Therex:  - UBE seat 10 L3 2.5 min fwd; 2.5 min bwd for RUE gentle strengthening and motion - Pulleys IR AAROM x 12 - BW Y's on the wall 2x x 12 cuing to keep shoulder blades on wall with good carry over  - Wall push ups 2x x 12  - Quadruped bird dog UE only 2x x 12 (bilaterally), ceased due to pain in wrist and shoulder  -  Supine serratus punches x 12 with good carry over - Doorway low trap stretch 1 x 30 secs  11/07/22 Therex:  - UBE seat 10 L3 2.5 min fwd; 2.5 min bwd for RUE gentle strengthening and motion - Pulleys IR AAROM x 12 - Seated AROM flex - Seated AROM abd with LUE behind back x 12  - Shoulder shrug with 10# DB x 12 with good carry over - Prone row 2x 6# DB x 12 - Prone overhead flex x 12 - I's 3# DB x 12 - T's 3# DB x 12 - Doorway low trap stretch 1 x 30 secs  PATIENT EDUCATION: Education details: POC, HEP Person educated: Patient Education method: Explanation Education comprehension: verbalized understanding  HOME EXERCISE PROGRAM: Plan for primary PT to update as needed.  ASSESSMENT:  CLINICAL IMPRESSION:  PT continued therex progression for increased strength, mobility, scapulohumeral rhythm, and pain reduction with success. Pt is able to comply with all cuing for proper form and technique of therex with most cuing needed for scapular movement. Pt experienced an increase in R wrist and shoulder pain with 2 set of quadruped bird dog UE only; PT incorporated a longer rest break following therex. PT will continue progression as able. Pt will continue to benefit from skilled PT to address the aforementioned R shoulder deficits to optimize ability to complete needed overhead ADL's and job tasks.   OBJECTIVE IMPAIRMENTS: decreased mobility, decreased ROM, impaired flexibility, impaired UE functional use, improper body mechanics, postural dysfunction, and pain.   ACTIVITY LIMITATIONS: carrying, lifting, sleeping, transfers, bed mobility, bathing, dressing, reach over head, and hygiene/grooming  PARTICIPATION LIMITATIONS: community activity, occupation, and yard work  PERSONAL FACTORS: Age, Past/current experiences, Profession, Time since onset of injury/illness/exacerbation, and 1 comorbidity: Hx of skin cancer  are also affecting patient's functional outcome.   REHAB POTENTIAL: Fair  Chronicity of symptoms  CLINICAL DECISION MAKING: Evolving/moderate complexity  EVALUATION COMPLEXITY: Moderate  GOALS: Goals reviewed with patient? No  SHORT TERM GOALS: Target date: 11/28/22  Pt will be independent with HEP to improve R shoulder strength and  mobility to improve ability to complete overhead ADL's Baseline: 10/31/22: Needs updated HEP Goal status: INITIAL  LONG TERM GOALS: Target date: 12/26/22  Pt will improve FOTO to target score to demonstrate clinically significant improvement in functional mobility.  Baseline: 10/31/22: 45 with target score of 66 Goal status: INITIAL  2.  Pt will decrease her QuickDash score by at least 10% to demonstrate clinically significant improvement in perceived use of RUE with ADL's and reduced disability. Baseline: 10/31/22: 47.7% Goal status: INITIAL  3.  Pt will improve R shoulder flexion and abduction to at least 120 degrees to demonstrate ability to complete overhead ADL's like bathing and dressing. Baseline: 10/31/22: flex: 106 deg, abduction: 82 deg Goal status: INITIAL  PLAN: PT FREQUENCY: 1-2x/week  PT DURATION: 8 weeks  PLANNED INTERVENTIONS: Therapeutic exercises, Therapeutic activity, Neuromuscular re-education, Patient/Family education, Self Care, Joint mobilization, Aquatic Therapy, Dry Needling, Electrical stimulation, Spinal manipulation, Spinal mobilization, Cryotherapy, Moist heat, Manual therapy, and Re-evaluation  PLAN FOR NEXT SESSION: Update HEP, R shoulder mobility and strengthening   Salem Caster. Fairly IV, PT, DPT Physical Therapist- [facility]  11/09/2022, 1:19 PM

## 2022-11-13 ENCOUNTER — Ambulatory Visit: Payer: PRIVATE HEALTH INSURANCE | Admitting: Occupational Therapy

## 2022-11-14 ENCOUNTER — Ambulatory Visit: Payer: PRIVATE HEALTH INSURANCE | Admitting: Occupational Therapy

## 2022-11-14 ENCOUNTER — Encounter: Payer: Self-pay | Admitting: Physical Therapy

## 2022-11-14 ENCOUNTER — Ambulatory Visit: Payer: PRIVATE HEALTH INSURANCE | Attending: Student | Admitting: Physical Therapy

## 2022-11-14 DIAGNOSIS — G8929 Other chronic pain: Secondary | ICD-10-CM | POA: Insufficient documentation

## 2022-11-14 DIAGNOSIS — M25511 Pain in right shoulder: Secondary | ICD-10-CM | POA: Insufficient documentation

## 2022-11-14 DIAGNOSIS — M6281 Muscle weakness (generalized): Secondary | ICD-10-CM | POA: Insufficient documentation

## 2022-11-14 DIAGNOSIS — M25641 Stiffness of right hand, not elsewhere classified: Secondary | ICD-10-CM

## 2022-11-14 DIAGNOSIS — M25631 Stiffness of right wrist, not elsewhere classified: Secondary | ICD-10-CM | POA: Diagnosis present

## 2022-11-14 NOTE — Therapy (Signed)
[facility] [address] [place], [address] Phone: [phone]   Fax:  [phone]  Occupational Therapy Treatment  Patient Details  Name: Alice Taylor MRN: [id number] Date of Birth: 06/24/1961 Referring Provider (OT): [facility] PA   Encounter Date: 11/14/2022   OT End of Session - 11/14/22 1417     Visit Number 16    Number of Visits 16    Date for OT Re-Evaluation 11/15/22    OT Start Time 1449    OT Stop Time 1528    OT Time Calculation (min) 39 min    Activity Tolerance Patient tolerated treatment well    Behavior During Therapy [facility] for tasks assessed/performed             Past Medical History:  Diagnosis Date   Cancer ([place]) 1970   Skin   GERD (gastroesophageal reflux disease)    History of kidney stones    PONV (postoperative nausea and vomiting)    x1 after orif ankle surgery    Past Surgical History:  Procedure Laterality Date   APPENDECTOMY     KNEE ARTHROSCOPY Right    KNEE ARTHROSCOPY Left 04/24/2018   Procedure: ARTHROSCOPY KNEE, PARTIAL MEDIAL MENISCECTOMY;  Surgeon: Dereck Leep, MD;  Location: [facility] ORS;  Service: Orthopedics;  Laterality: Left;   ORIF ANKLE FRACTURE Right 01/17/2019   Procedure: OPEN REDUCTION INTERNAL FIXATION (ORIF) ANKLE FRACTURE;  Surgeon: Hessie Knows, MD;  Location: [facility] ORS;  Service: Orthopedics;  Laterality: Right;   ORIF WRIST FRACTURE Right 02/21/2022   Procedure: OPEN REDUCTION INTERNAL FIXATION (ORIF) OR RIGHT DISTAL RADIUS FRACTURE.;  Surgeon: Corky Mull, MD;  Location: [facility] ORS;  Service: Orthopedics;  Laterality: Right;    There were no vitals filed for this visit.   Subjective Assessment - 11/14/22 1417     Subjective  Doing okay - cast still doing good at night time - putty I am doing 3 x wk - still little hard gripping knife to cut - wrist little more sore with the pushups I am doing for my shoulder- more pain now in my shoulder into my neck - but  motion better    Pertinent History Pt last seen by PA at Ortho on 04/03/22 - Alice Taylor is a 62 y.o. female who presents for follow-up now 6 weeks status post an open reduction and internal fixation of her right distal radius fracture with an ulnar styloid fracture. Overall, the patient feels that she is doing well. She still notes some discomfort in her wrist which she rates at 4/10 on today's visit, and for which she has been taking Tylenol and applying ice as necessary with temporary partial relief of her symptoms. She been wearing her Velcro wrist immobilizer on a regular basis, removing it for bathing purposes and for exercises. She still notes moderate stiffness in the wrist and fingers, as well as some swelling, but denies any reinjury to the hand or wrist. She also denies any fevers or chills, and denies any numbness or paresthesias to her fingers.Xray showed good healing at radius and ulnar and refer to therapy - pt has been doing PT at [facility] 3 x wk per pt the last month - pt refer to OT for digits stiffness and possible splint. 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  As well as painting and being able to decorate at different events.    Currently in Pain? Yes    Pain Score 2     Pain Location Wrist    Pain Orientation Right    Pain Descriptors / Indicators Sore                [facility] OT Assessment - 11/14/22 0001       Strength   Right Hand Grip (lbs) 30      Right Hand AROM   R Little  MCP 0-90 90 Degrees    R Little PIP 0-100 -15 Degrees   100   R Little DIP 0-70 60 Degrees               AROM for wrist and forearm WNL  Strength  5-/5 end range supination 5/5 for other  motions Pt cont to address 5th digit PIP extention using LMB splint and paraffin At night time PIP extention cast       OT Treatments/Exercises (OP) - 11/14/22 0001       RUE Paraffin   Number Minutes Paraffin 8 Minutes    RUE Paraffin Location Hand   wrist   Comments LMB splint on 5th - glove on scratches from dog                                  Patient to continue to focus on composite  digit extension and flexion in the shower.  Cont to do  table slides for wrist extention as needed Followed by addressing PIP extension using her LMB splint and paraffin  Soft tissue massage in the volar digit done by OT [place] tool #2 for brushing with passive range of motion and gentle joint mobs to the PIP. Review and cont PIP extension with MC at 90 degree flexion. Patient can do that several times during the day. Passive range of motion 0 in session. Cont quick cast for PIP extension to use at nighttime. Green putty for gripping pulling with ulnar side of hand  20reps  2 x day - if not hurting or done a lot on farm the day  Pt cont at home -contact me as needed            OT Education - 11/14/22 1417     Education Details progress for wrist, HEP changes and use of LMB splint 5th PIP; and cast at nighttime    Person(s) Educated Patient    Methods Explanation;Demonstration;Tactile cues;Verbal cues;Handout    Comprehension Verbal cues required;Returned demonstration;Verbalized understanding              OT Short Term Goals - 09/20/22 1626       OT SHORT TERM GOAL #1   Title Patient to be independent in home program for active assisted range of motion for wrist and forearm in all planes to show progress to be able to upgrade patient to 2 pound weight.    Status Achieved               OT Long Term Goals - 11/14/22 1534       OT LONG TERM GOAL #4   Title Pt to be  ind in HEP and use of splints /cast in if indicated to decrease flexor contracture less  than 10 degrees to put hand in pocket and donn gloves without issues.    Baseline PIP extention at 5th -25 degrees- tight in PROM - joint enlarge   NOW -15 progressing every session - flexion WNL -and can do hand in pocket and gloves    Status Achieved      OT LONG TERM GOAL #5   Title Pt to be able to push up from bed and chair with no increase symptoms    Baseline Report earlier today wrist bother pt with wall pushup - in session after table slides pt had no issues  NOW full motion -if doing pushups in PT - can get sore - but use paraffin helps for soreness    Status Achieved                   Plan - 11/14/22 1418     Clinical Impression Statement Patient was seen by this OT after her diagnosis of postop Colles' fracture R distal radius  ORIF on 02/21/22.  Pt made great progress at that time in about 6 visits but then had had another fall early Oct that resulted in a proximal humerus nondisplaced fracture.  SHe was in sling for while and  started working with PT for shoulder - and return to OT with increase stiffness in 5th digit PIP . Pt did in the past show some Cubital tunnel symptoms- tenderness and + Tinel. making progress the last few sessions in flexor contracture at 5th to -15 this date and flexion WNL - grip increase to 30 lbs.  Pt to cont with LMB splint to use during her paraffin and for soreness at wrist. Cont with PROM to PIP ext in lumbrical fist to avoid over stretching of volar plate at [facility]. Cont with extention cast for night time. Pt to cont with HEP and can call me in 6-8 wks if needed.    OT Occupational Profile and History Problem Focused Assessment - Including review of records relating to presenting problem    Occupational performance deficits (Please refer to evaluation for details): ADL's;IADL's;Work;Play;Leisure;Social Participation    Body Structure / Function / Physical Skills ADL;Strength;Dexterity;Pain;Edema;UE functional use;IADL;ROM;Scar mobility;Flexibility     Rehab Potential Good    Clinical Decision Making Limited treatment options, no task modification necessary    Comorbidities Affecting Occupational Performance: None    Modification or Assistance to Complete Evaluation  No modification of tasks or assist necessary to complete eval    OT Treatment/Interventions Self-care/ADL training;Fluidtherapy;Moist Heat;Contrast Bath;Paraffin;Manual Therapy;Patient/family education;Passive range of motion;Scar mobilization;Therapeutic exercise;Splinting;DME and/or AE instruction    Consulted and Agree with Plan of Care Patient             Patient will benefit from skilled therapeutic intervention in order to improve the following deficits and impairments:   Body Structure / Function / Physical Skills: ADL, Strength, Dexterity, Pain, Edema, UE functional use, IADL, ROM, Scar mobility, Flexibility       Visit Diagnosis: Muscle weakness (generalized)  Stiffness of right hand, not elsewhere classified    Problem List Patient Active Problem List   Diagnosis Date Noted   Closed right trimalleolar fracture, initial encounter 01/17/2019    Rosalyn Gess, OTR/L,CLT 11/14/2022, 3:36 PM  [facility] [address], [place], [address] Phone: [phone]   Fax:  [phone]  Name: Miss Dugal MRN:  [id number] Date of Birth: 02-20-1961

## 2022-11-14 NOTE — Therapy (Signed)
OUTPATIENT PHYSICAL THERAPY UPPER EXTREMITY TREATMENT   Patient Name: Alice Taylor MRN: [id number] DOB:15-Nov-1960, 62 y.o., female Today's Date: 11/14/2022  END OF SESSION:  PT End of Session - 11/14/22 1300     Visit Number 4    Number of Visits 17    Date for PT Re-Evaluation 12/26/22    Authorization Type [employer]    Authorization Time Period 07/28/22-10/11/21    Authorization - Visit Number 4    Authorization - Number of Visits 17    Progress Note Due on Visit 10    PT Start Time 1300    PT Stop Time 1340    PT Time Calculation (min) 40 min    Activity Tolerance Patient tolerated treatment well    Behavior During Therapy WFL for tasks assessed/performed                Past Medical History:  Diagnosis Date   Cancer ([place]) 1970   Skin   GERD (gastroesophageal reflux disease)    History of kidney stones    PONV (postoperative nausea and vomiting)    x1 after orif ankle surgery   Past Surgical History:  Procedure Laterality Date   APPENDECTOMY     KNEE ARTHROSCOPY Right    KNEE ARTHROSCOPY Left 04/24/2018   Procedure: ARTHROSCOPY KNEE, PARTIAL MEDIAL MENISCECTOMY;  Surgeon: Dereck Leep, MD;  Location: [facility];  Service: Orthopedics;  Laterality: Left;   ORIF ANKLE FRACTURE Right 01/17/2019   Procedure: OPEN REDUCTION INTERNAL FIXATION (ORIF) ANKLE FRACTURE;  Surgeon: Hessie Knows, MD;  Location: [facility];  Service: Orthopedics;  Laterality: Right;   ORIF WRIST FRACTURE Right 02/21/2022   Procedure: OPEN REDUCTION INTERNAL FIXATION (ORIF) OR RIGHT DISTAL RADIUS FRACTURE.;  Surgeon: Corky Mull, MD;  Location: [facility];  Service: Orthopedics;  Laterality: Right;   Patient Active Problem List   Diagnosis Date Noted   Closed right trimalleolar fracture, initial encounter 01/17/2019    PCP: Dolleschel, Dyann Kief PROVIDER: Cameron Proud   REFERRING DIAG: Fx Shoulder, Right  THERAPY DIAG:  Chronic pain in right shoulder  Rationale for  Evaluation and Treatment: Rehabilitation  ONSET DATE: Fx Shoulder, Right  SUBJECTIVE:                                                                                                                                                                                      SUBJECTIVE STATEMENT: Pt reports she hasn't heard from the company for the bone stimulator- which is frustrating. Reports pain is 3/10.   PERTINENT HISTORY: Pt is known to clinic returning due to R shoulder pain and limited AROM due to difficulty healing  of R proximal humerus fracture. Pt reports she was unable to get her R shoulder manipulation as her MRI revealed her fracture has yet to heal. So per the orthopedic PA, the plan is to trial bone stimulator and additional PT and reassess after bone stimulation is complete. Worst pain 5/10 NPS, is sometimes pain free. Currently just soreness. Still having difficulty with overhead ADL's like doing her hair, difficulty getting in and out her her tub, standing up from the floor. Reports no specific limitations or precautions to her R shoulder. 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   PAIN:  Are you having pain? Yes: NPRS scale: 1/10 Pain location: R shoulder Pain description: Dull/achey Aggravating factors: Overhead motion Relieving factors: rest  PRECAUTIONS: None  WEIGHT BEARING RESTRICTIONS: No  FALLS:  Has patient fallen in last 6 months? 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  PLOF: Independent  PATIENT GOALS: Improve her R shoulder AROm  NEXT MD VISIT: N/A  OBJECTIVE:   DIAGNOSTIC FINDINGS:  CLINICAL DATA:  Right proximal humerus fracture follow-up.   EXAM: MRI OF THE RIGHT SHOULDER WITHOUT CONTRAST   TECHNIQUE: Multiplanar, multisequence MR imaging of the shoulder was performed. No intravenous contrast was  administered.   COMPARISON:  Right shoulder x-rays dated June 19, 2022.   FINDINGS: Rotator cuff: Moderate supraspinatus tendinosis with bursal surface fraying. Mild subscapularis tendinosis.   Muscles: No atrophy or abnormal signal of the muscles of the rotator cuff.   Biceps long head: Intact and normally positioned. Mild intra-articular tendinosis.   Acromioclavicular Joint: Moderate arthropathy of the acromioclavicular joint. Trace fluid in the subacromial/subdeltoid bursa.   Glenohumeral Joint: No joint effusion. No chondral defect.   Labrum: Grossly intact, but evaluation is limited by lack of intraarticular fluid.   Bones: Chronic, partially healed fracture of the proximal humerus surgical neck. Impaction measures up to 1.2 cm. Slight internal rotation and abduction of the humeral head. Continued marrow edema around the fracture site. No dislocation. No suspicious bone lesion.   Other: None.   IMPRESSION: 1. Chronic, partially healed impacted fracture of the proximal humerus surgical neck. 2. Moderate supraspinatus tendinosis with bursal surface fraying. No high-grade rotator cuff tear. 3. Mild subscapularis and intra-articular biceps tendinosis. 4. Moderate acromioclavicular osteoarthritis.     Electronically Signed   By: Titus Dubin M.D.   On: 10/23/2022 14:16  PATIENT SURVEYS :  FOTO 45 with target of 66  Quick DASH: 47.7% COGNITION: Overall cognitive status: Within functional limits for tasks assessed      POSTURE: Resting in sitting with R upper trap over activation.   UPPER EXTREMITY ROM:   Apley's functional ER: R thumb to CTJ  Apley's functional IR: R thumb to PSIS  Active ROM Right eval Left eval  Shoulder flexion 106* 160  Shoulder extension 20* 75  Shoulder abduction 82* 130  Shoulder adduction    Shoulder internal rotation 90 90  Shoulder external rotation 31* 90  Elbow flexion    Elbow extension    Wrist flexion    Wrist  extension    Wrist ulnar deviation    Wrist radial deviation    Wrist pronation    Wrist supination    (Blank rows = not tested)  Cervical AROM: 40 d ext, 70d flx; Lateral flx - R: 30d, L: 45d; Rotation - R: 55d,  L: 45d  Passive ROM Right eval Left eval  Shoulder flexion 128   Shoulder extension    Shoulder abduction 98   Shoulder adduction    Shoulder internal rotation    Shoulder external rotation 40   Elbow flexion    Elbow extension    Wrist flexion    Wrist extension    Wrist ulnar deviation    Wrist radial deviation    Wrist pronation    Wrist supination    (Blank rows = not tested)  UPPER EXTREMITY MMT:  MMT Right eval Left eval  Shoulder flexion 5 5  Shoulder extension 3+ 4  Shoulder abduction 4 4  Shoulder adduction    Shoulder internal rotation 4 5  Shoulder external rotation 3 4  Middle trapezius 5 5  Lower trapezius 4+ 5  Elbow flexion    Elbow extension    Wrist flexion    Wrist extension    Wrist ulnar deviation    Wrist radial deviation    Wrist pronation    Wrist supination    Grip strength (lbs)    (Blank rows = not tested)  SHOULDER SPECIAL TESTS: Not tested  JOINT MOBILITY TESTING:  Deferred due to partially healed proximal humerus fracture at surgical neck.   PALPATION:  TTP along incision, has painful area near long head of biceps tendon where pt has reported scar tissue from healing fracture.    TODAY'S TREATMENT:                                                                                                                                         DATE: 11/09/22 Therex:  - Nustep seat 9 UE 11 L6 6mn for gentle pro/retraction strengthening - Wall walk x12 with min cuing for scapulohumeral rhythm - Quadruped bird dog UE only 2x x 12 with cuing to prevent thoracic kyphosis with good carry over - Stir the pot on theraball 2x 6 with good carry over of initial demo - Wall push up x12; with small push off "jump" x6  Lat stretch  30secH Doorway pec stretch (overhead) 30secH   PATIENT EDUCATION: Education details: POC, HEP Person educated: Patient Education method: Explanation Education comprehension: verbalized understanding  HOME EXERCISE PROGRAM: Plan for primary PT to update as needed.  ASSESSMENT:  CLINICAL IMPRESSION: PT continued therex progression for increased strength, mobility, scapulohumeral rhythm, and pain reduction with success. PT continued progression with increased WB demand for bone health/healing to pain tolerance. Pt is able to comply with all cuing for proper technique of therex with good motivation throughout session- no increased pain.  Pt will continue to benefit from skilled PT to address the aforementioned R shoulder deficits to optimize ability to complete needed overhead ADL's and job tasks.   OBJECTIVE IMPAIRMENTS: decreased mobility, decreased ROM, impaired flexibility, impaired UE functional use, improper body mechanics, postural dysfunction, and pain.   ACTIVITY LIMITATIONS:  carrying, lifting, sleeping, transfers, bed mobility, bathing, dressing, reach over head, and hygiene/grooming  PARTICIPATION LIMITATIONS: community activity, occupation, and yard work  PERSONAL FACTORS: Age, Past/current experiences, Profession, Time since onset of injury/illness/exacerbation, and 1 comorbidity: Hx of skin cancer  are also affecting patient's functional outcome.   REHAB POTENTIAL: Fair Chronicity of symptoms  CLINICAL DECISION MAKING: Evolving/moderate complexity  EVALUATION COMPLEXITY: Moderate  GOALS: Goals reviewed with patient? No  SHORT TERM GOALS: Target date: 11/28/22  Pt will be independent with HEP to improve R shoulder strength and  mobility to improve ability to complete overhead ADL's Baseline: 10/31/22: Needs updated HEP Goal status: INITIAL  LONG TERM GOALS: Target date: 12/26/22  Pt will improve FOTO to target score to demonstrate clinically significant improvement  in functional mobility.  Baseline: 10/31/22: 45 with target score of 66 Goal status: INITIAL  2.  Pt will decrease her QuickDash score by at least 10% to demonstrate clinically significant improvement in perceived use of RUE with ADL's and reduced disability. Baseline: 10/31/22: 47.7% Goal status: INITIAL  3.  Pt will improve R shoulder flexion and abduction to at least 120 degrees to demonstrate ability to complete overhead ADL's like bathing and dressing. Baseline: 10/31/22: flex: 106 deg, abduction: 82 deg Goal status: INITIAL  PLAN: PT FREQUENCY: 1-2x/week  PT DURATION: 8 weeks  PLANNED INTERVENTIONS: Therapeutic exercises, Therapeutic activity, Neuromuscular re-education, Patient/Family education, Self Care, Joint mobilization, Aquatic Therapy, Dry Needling, Electrical stimulation, Spinal manipulation, Spinal mobilization, Cryotherapy, Moist heat, Manual therapy, and Re-evaluation  PLAN FOR NEXT SESSION: Update HEP, R shoulder mobility and strengthening   Durwin Reges DPT  Physical Therapist- [facility]  11/14/2022, 1:43 PM

## 2022-11-20 ENCOUNTER — Ambulatory Visit: Payer: PRIVATE HEALTH INSURANCE

## 2022-11-20 DIAGNOSIS — M25511 Pain in right shoulder: Secondary | ICD-10-CM | POA: Diagnosis not present

## 2022-11-20 DIAGNOSIS — M25631 Stiffness of right wrist, not elsewhere classified: Secondary | ICD-10-CM

## 2022-11-20 DIAGNOSIS — M25641 Stiffness of right hand, not elsewhere classified: Secondary | ICD-10-CM

## 2022-11-20 DIAGNOSIS — G8929 Other chronic pain: Secondary | ICD-10-CM

## 2022-11-20 DIAGNOSIS — M6281 Muscle weakness (generalized): Secondary | ICD-10-CM

## 2022-11-20 NOTE — Therapy (Signed)
OUTPATIENT PHYSICAL THERAPY TREATMENT   Patient Name: Alice Taylor MRN: [id number] DOB:1961/01/24, 62 y.o., female Today's Date: 11/20/2022  END OF SESSION:  PT End of Session - 11/20/22 1437     Visit Number 5    Number of Visits 17    Date for PT Re-Evaluation 12/26/22    Authorization Type [employer]    Authorization Time Period 10/31/22-12/26/22    Progress Note Due on Visit 10    PT Start Time 1432    PT Stop Time 1512    PT Time Calculation (min) 40 min    Activity Tolerance Patient tolerated treatment well;No increased pain    Behavior During Therapy WFL for tasks assessed/performed                Past Medical History:  Diagnosis Date   Cancer ([place]) 1970   Skin   GERD (gastroesophageal reflux disease)    History of kidney stones    PONV (postoperative nausea and vomiting)    x1 after orif ankle surgery   Past Surgical History:  Procedure Laterality Date   APPENDECTOMY     KNEE ARTHROSCOPY Right    KNEE ARTHROSCOPY Left 04/24/2018   Procedure: ARTHROSCOPY KNEE, PARTIAL MEDIAL MENISCECTOMY;  Surgeon: Dereck Leep, MD;  Location: [facility];  Service: Orthopedics;  Laterality: Left;   ORIF ANKLE FRACTURE Right 01/17/2019   Procedure: OPEN REDUCTION INTERNAL FIXATION (ORIF) ANKLE FRACTURE;  Surgeon: Hessie Knows, MD;  Location: [facility];  Service: Orthopedics;  Laterality: Right;   ORIF WRIST FRACTURE Right 02/21/2022   Procedure: OPEN REDUCTION INTERNAL FIXATION (ORIF) OR RIGHT DISTAL RADIUS FRACTURE.;  Surgeon: Corky Mull, MD;  Location: [facility];  Service: Orthopedics;  Laterality: Right;   Patient Active Problem List   Diagnosis Date Noted   Closed right trimalleolar fracture, initial encounter 01/17/2019    PCP: Dolleschel, Dyann Kief PROVIDER: Cameron Proud   REFERRING DIAG: Fx Shoulder, Right  THERAPY DIAG:  Muscle weakness (generalized)  Stiffness of right hand, not elsewhere classified  Chronic pain in right  shoulder  Stiffness of right wrist, not elsewhere classified  Rationale for Evaluation and Treatment: Rehabilitation  ONSET DATE: Fx Shoulder, Right  SUBJECTIVE:                                                                                                                                                                                      SUBJECTIVE STATEMENT: She's got her bone stimulator but needs to go by [facility] ortho to have a demo on how to use. No other updates. Still has intermittent pain with occasional fast abrupt movements.    PERTINENT HISTORY: Pt  is known to clinic returning due to R shoulder pain and limited AROM due to difficulty healing of R proximal humerus fracture. Pt reports she was unable to get her R shoulder manipulation as her MRI revealed her fracture has yet to heal. So per the orthopedic PA, the plan is to trial bone stimulator and additional PT and reassess after bone stimulation is complete. Worst pain 5/10 NPS, is sometimes pain free. Currently just soreness. Still having difficulty with overhead ADL's like doing her hair, difficulty getting in and out her her tub, standing up from the floor. Reports no specific limitations or precautions to her R shoulder. 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   PAIN:  Are you having pain? Yes: NPRS scale: 1/10 Pain location: R shoulder Pain description: Dull/achey Aggravating factors: Overhead motion Relieving factors: rest  PRECAUTIONS: None  WEIGHT BEARING RESTRICTIONS: No  FALLS:  Has patient fallen in last 6 months? 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  PLOF: Independent  PATIENT GOALS: Improve her R shoulder AROM  NEXT MD VISIT: N/A  OBJECTIVE:    INTERVENTION THIS DATE: 11/20/22    Therex:  - AA/ROM Nustep seat 9 UE 11 L6 19mn  - short sitting overhead lift  with silver physioball x20 - hook lying on big towel roll starting now -T pec stretch with heat on anterior shoulder joint and pec major x90 sec  - wand flexion c silver physioball x20  -T pec stretch with heat on anterior shoulder joint and pec major x90 sec  - wand scaption c long PVC x20  -T pec stretch with heat on anterior shoulder joint and pec major x90 sec -RUE in line of gravity arm circles 15x CCW, 15x CW  -RUE in line of gravity eccentric lowering and return in each hour of clock face pain free range   - seated EOB throacic spine flexion, extension A/ROM x10 each  - quadruped alternating CL shoulder taps 1x20 alteranting   PATIENT EDUCATION: Education details: POC, HEP Person educated: Patient Education method: Explanation Education comprehension: verbalized understanding  HOME EXERCISE PROGRAM: Plan for primary PT to update as needed.  ASSESSMENT:  CLINICAL IMPRESSION: Continued with current plan of care as laid out in evaluation and recent prior sessions. All interventions tolerated as expected by author. Mobility and strength continue to progress as anticipated. Recovery intervals given as needed based on signs of exertion and/or pt request. Pt educated on best technique for each intervention- author uses verbal, visual, tactile cues to optimize learning. Author takes steps to maximize patient independence when appropriate. Pt remains highly motivated. Pt closely monitored throughout session for safe activity response, as well as to maximize patient safety during interventions. The patient's therapy prognosis indicates continued potential for improvement, anticipate that future progress is attainable in a reasonable/predictable timeframe. Maximum improvement is within reach. Pt will continue to benefit from skilled PT services to address deficits and impairment identified in evaluation in order to maximize independence and safety in basic mobility required for performance of  ADL, IADL, and leisure.     OBJECTIVE IMPAIRMENTS: decreased mobility, decreased ROM, impaired flexibility, impaired UE functional use, improper body mechanics, postural dysfunction, and pain.   ACTIVITY LIMITATIONS: carrying, lifting, sleeping, transfers, bed mobility, bathing, dressing, reach over head, and hygiene/grooming  PARTICIPATION LIMITATIONS: community activity, occupation, and yard work  PERSONAL FACTORS: Age, Past/current experiences, Profession, Time since onset of injury/illness/exacerbation, and 1 comorbidity: Hx of skin cancer  are also affecting patient's functional outcome.   REHAB POTENTIAL: Fair Chronicity of symptoms  CLINICAL DECISION MAKING: Evolving/moderate complexity  EVALUATION COMPLEXITY: Moderate  GOALS: Goals reviewed with patient? No  SHORT TERM GOALS: Target date: 11/28/22  Pt will be independent with HEP to improve R shoulder strength and  mobility to improve ability to complete overhead ADL's Baseline: 10/31/22: Needs updated HEP Goal status: INITIAL  LONG TERM GOALS: Target date: 12/26/22  Pt will improve FOTO to target score to demonstrate clinically significant improvement in functional mobility.  Baseline: 10/31/22: 45 with target score of 66 Goal status: INITIAL  2.  Pt will decrease her QuickDash score by at least 10% to demonstrate clinically significant improvement in perceived use of RUE with ADL's and reduced disability. Baseline: 10/31/22: 47.7% Goal status: INITIAL  3.  Pt will improve R shoulder flexion and abduction to at least 120 degrees to demonstrate ability to complete overhead ADL's like bathing and dressing. Baseline: 10/31/22: flex: 106 deg, abduction: 82 deg Goal status: INITIAL  PLAN: PT FREQUENCY: 1-2x/week  PT DURATION: 8 weeks  PLANNED INTERVENTIONS: Therapeutic exercises, Therapeutic activity, Neuromuscular re-education, Patient/Family education, Self Care, Joint mobilization, Aquatic Therapy, Dry Needling,  Electrical stimulation, Spinal manipulation, Spinal mobilization, Cryotherapy, Moist heat, Manual therapy, and Re-evaluation  PLAN FOR NEXT SESSION: Update HEP, R shoulder mobility and strengthening   2:47 PM, 11/20/22 Etta Grandchild, PT, DPT Physical Therapist - [place] [phone] (Office)    11/20/2022, 2:46 PM

## 2022-11-22 ENCOUNTER — Ambulatory Visit: Payer: PRIVATE HEALTH INSURANCE | Admitting: Physical Therapy

## 2022-11-27 ENCOUNTER — Ambulatory Visit: Payer: PRIVATE HEALTH INSURANCE | Admitting: Physical Therapy

## 2022-11-27 DIAGNOSIS — M25511 Pain in right shoulder: Secondary | ICD-10-CM | POA: Diagnosis not present

## 2022-11-27 DIAGNOSIS — M6281 Muscle weakness (generalized): Secondary | ICD-10-CM

## 2022-11-27 NOTE — Therapy (Signed)
OUTPATIENT PHYSICAL THERAPY TREATMENT   Patient Name: Alice Taylor MRN: [id number] DOB:1960/09/21, 62 y.o., female Today's Date: 11/27/2022  END OF SESSION:  PT End of Session - 11/27/22 0919     Visit Number 6    Number of Visits 17    Date for PT Re-Evaluation 12/26/22    Authorization Type [employer]    Authorization Time Period 10/31/22-12/26/22    Authorization - Visit Number 5    Authorization - Number of Visits 17    Progress Note Due on Visit 10    PT Start Time 0920    PT Stop Time 1000    PT Time Calculation (min) 40 min    Activity Tolerance Patient tolerated treatment well;No increased pain    Behavior During Therapy WFL for tasks assessed/performed                 Past Medical History:  Diagnosis Date   Cancer ([place]) 1970   Skin   GERD (gastroesophageal reflux disease)    History of kidney stones    PONV (postoperative nausea and vomiting)    x1 after orif ankle surgery   Past Surgical History:  Procedure Laterality Date   APPENDECTOMY     KNEE ARTHROSCOPY Right    KNEE ARTHROSCOPY Left 04/24/2018   Procedure: ARTHROSCOPY KNEE, PARTIAL MEDIAL MENISCECTOMY;  Surgeon: Dereck Leep, MD;  Location: [facility];  Service: Orthopedics;  Laterality: Left;   ORIF ANKLE FRACTURE Right 01/17/2019   Procedure: OPEN REDUCTION INTERNAL FIXATION (ORIF) ANKLE FRACTURE;  Surgeon: Hessie Knows, MD;  Location: [facility];  Service: Orthopedics;  Laterality: Right;   ORIF WRIST FRACTURE Right 02/21/2022   Procedure: OPEN REDUCTION INTERNAL FIXATION (ORIF) OR RIGHT DISTAL RADIUS FRACTURE.;  Surgeon: Corky Mull, MD;  Location: [facility];  Service: Orthopedics;  Laterality: Right;   Patient Active Problem List   Diagnosis Date Noted   Closed right trimalleolar fracture, initial encounter 01/17/2019    PCP: Dolleschel, Dyann Kief PROVIDER: Cameron Proud   REFERRING DIAG: Fx Shoulder, Right  THERAPY DIAG:  Muscle weakness (generalized)  Rationale for  Evaluation and Treatment: Rehabilitation  ONSET DATE: Fx Shoulder, Right  SUBJECTIVE:                                                                                                                                                                                      SUBJECTIVE STATEMENT: Pt is using bone stimulator, thinks her shoulder is doing better with this. Completing HEP   PERTINENT HISTORY: Pt is known to clinic returning due to R shoulder pain and limited AROM due to difficulty healing of R proximal humerus fracture.  Pt reports she was unable to get her R shoulder manipulation as her MRI revealed her fracture has yet to heal. So per the orthopedic PA, the plan is to trial bone stimulator and additional PT and reassess after bone stimulation is complete. Worst pain 5/10 NPS, is sometimes pain free. Currently just soreness. Still having difficulty with overhead ADL's like doing her hair, difficulty getting in and out her her tub, standing up from the floor. Reports no specific limitations or precautions to her R shoulder. 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   PAIN:  Are you having pain? Yes: NPRS scale: 1/10 Pain location: R shoulder Pain description: Dull/achey Aggravating factors: Overhead motion Relieving factors: rest  PRECAUTIONS: None  WEIGHT BEARING RESTRICTIONS: No  FALLS:  Has patient fallen in last 6 months? 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  PLOF: Independent  PATIENT GOALS: Improve her R shoulder AROM  NEXT MD VISIT: N/A  OBJECTIVE:    INTERVENTION THIS DATE: 11/27/22    Therex:  - AA/ROM Nustep seat 9 UE 11 L6 74min  - Sitting AAROM overhead flex with PVC pipe with towel roll x12 - Same set up in hooklying AAROM PVC pipe flex x12 2secH; abd x12 2secH - hook lying pec stretch 30secH -Prone T 2x 12 with  focus on scapular retraction with good carry over - Plank from forearms 30sec with good carry over  - Wt shifting in knee plank 2x 12 with good carry ove rof cuing for positioning -Scaption at wall 2# DB 2x 10/8 Post shoulder rolls x12   PATIENT EDUCATION: Education details: POC, HEP Person educated: Patient Education method: Explanation Education comprehension: verbalized understanding  HOME EXERCISE PROGRAM: Plan for primary PT to update as needed.  ASSESSMENT:  CLINICAL IMPRESSION: PT continued therex progression as able. Pt is able to comply with all cuing for proper technique of therex with good motivation throughout session and no increased pain.  Pt will continue to benefit from skilled PT services to address deficits and impairment identified in evaluation in order to maximize independence and safety in basic mobility required for performance of ADL, IADL, and leisure.     OBJECTIVE IMPAIRMENTS: decreased mobility, decreased ROM, impaired flexibility, impaired UE functional use, improper body mechanics, postural dysfunction, and pain.   ACTIVITY LIMITATIONS: carrying, lifting, sleeping, transfers, bed mobility, bathing, dressing, reach over head, and hygiene/grooming  PARTICIPATION LIMITATIONS: community activity, occupation, and yard work  PERSONAL FACTORS: Age, Past/current experiences, Profession, Time since onset of injury/illness/exacerbation, and 1 comorbidity: Hx of skin cancer  are also affecting patient's functional outcome.   REHAB POTENTIAL: Fair Chronicity of symptoms  CLINICAL DECISION MAKING: Evolving/moderate complexity  EVALUATION COMPLEXITY: Moderate  GOALS: Goals reviewed with patient? No  SHORT TERM GOALS: Target date: 11/28/22  Pt will be independent with HEP to improve R shoulder strength and  mobility to improve ability to complete overhead ADL's Baseline: 10/31/22: Needs updated HEP Goal status: INITIAL  LONG TERM GOALS: Target date:  12/26/22  Pt will improve FOTO to target score to demonstrate clinically significant improvement in functional mobility.  Baseline: 10/31/22: 45 with target score of 66 Goal status: INITIAL  2.  Pt will decrease her QuickDash score by at least 10% to demonstrate clinically significant improvement in perceived use of RUE with ADL's and reduced disability. Baseline: 10/31/22: 47.7% Goal  status: INITIAL  3.  Pt will improve R shoulder flexion and abduction to at least 120 degrees to demonstrate ability to complete overhead ADL's like bathing and dressing. Baseline: 10/31/22: flex: 106 deg, abduction: 82 deg Goal status: INITIAL  PLAN: PT FREQUENCY: 1-2x/week  PT DURATION: 8 weeks  PLANNED INTERVENTIONS: Therapeutic exercises, Therapeutic activity, Neuromuscular re-education, Patient/Family education, Self Care, Joint mobilization, Aquatic Therapy, Dry Needling, Electrical stimulation, Spinal manipulation, Spinal mobilization, Cryotherapy, Moist heat, Manual therapy, and Re-evaluation  PLAN FOR NEXT SESSION: Update HEP, R shoulder mobility and strengthening  Durwin Reges DPT    11/27/2022, 10:00 AM

## 2022-11-30 ENCOUNTER — Ambulatory Visit: Payer: PRIVATE HEALTH INSURANCE | Admitting: Physical Therapy

## 2022-12-04 ENCOUNTER — Ambulatory Visit: Payer: PRIVATE HEALTH INSURANCE | Admitting: Physical Therapy

## 2022-12-04 ENCOUNTER — Encounter: Payer: Self-pay | Admitting: Physical Therapy

## 2022-12-04 DIAGNOSIS — M25511 Pain in right shoulder: Secondary | ICD-10-CM | POA: Diagnosis not present

## 2022-12-04 DIAGNOSIS — M25641 Stiffness of right hand, not elsewhere classified: Secondary | ICD-10-CM

## 2022-12-04 DIAGNOSIS — M6281 Muscle weakness (generalized): Secondary | ICD-10-CM

## 2022-12-04 DIAGNOSIS — G8929 Other chronic pain: Secondary | ICD-10-CM

## 2022-12-04 NOTE — Therapy (Signed)
OUTPATIENT PHYSICAL THERAPY TREATMENT   Patient Name: Alice Taylor MRN: [id number] DOB:Oct 20, 1960, 62 y.o., female Today's Date: 12/04/2022  END OF SESSION:  PT End of Session - 12/04/22 1055     Visit Number 7    Number of Visits 17    Date for PT Re-Evaluation 12/26/22    Authorization Type [employer]    Authorization Time Period 10/31/22-12/26/22    Authorization - Visit Number 7    Authorization - Number of Visits 17    Progress Note Due on Visit 10    PT Start Time 1050    PT Stop Time 1128    PT Time Calculation (min) 38 min    Activity Tolerance Patient tolerated treatment well;No increased pain    Behavior During Therapy WFL for tasks assessed/performed                  Past Medical History:  Diagnosis Date   Cancer ([place]) 1970   Skin   GERD (gastroesophageal reflux disease)    History of kidney stones    PONV (postoperative nausea and vomiting)    x1 after orif ankle surgery   Past Surgical History:  Procedure Laterality Date   APPENDECTOMY     KNEE ARTHROSCOPY Right    KNEE ARTHROSCOPY Left 04/24/2018   Procedure: ARTHROSCOPY KNEE, PARTIAL MEDIAL MENISCECTOMY;  Surgeon: Dereck Leep, MD;  Location: [facility];  Service: Orthopedics;  Laterality: Left;   ORIF ANKLE FRACTURE Right 01/17/2019   Procedure: OPEN REDUCTION INTERNAL FIXATION (ORIF) ANKLE FRACTURE;  Surgeon: Hessie Knows, MD;  Location: [facility];  Service: Orthopedics;  Laterality: Right;   ORIF WRIST FRACTURE Right 02/21/2022   Procedure: OPEN REDUCTION INTERNAL FIXATION (ORIF) OR RIGHT DISTAL RADIUS FRACTURE.;  Surgeon: Corky Mull, MD;  Location: [facility];  Service: Orthopedics;  Laterality: Right;   Patient Active Problem List   Diagnosis Date Noted   Closed right trimalleolar fracture, initial encounter 01/17/2019    PCP: Dolleschel, Dyann Kief PROVIDER: Cameron Proud   REFERRING DIAG: Fx Shoulder, Right  THERAPY DIAG:  Muscle weakness (generalized)  Stiffness of  right hand, not elsewhere classified  Chronic pain in right shoulder  Rationale for Evaluation and Treatment: Rehabilitation  ONSET DATE: Fx Shoulder, Right  SUBJECTIVE:                                                                                                                                                                                      SUBJECTIVE STATEMENT: Pt is using bone stimulator, thinks her shoulder is doing better with this. Completing HEP   PERTINENT HISTORY: Pt is known to clinic returning due to  R shoulder pain and limited AROM due to difficulty healing of R proximal humerus fracture. Pt reports she was unable to get her R shoulder manipulation as her MRI revealed her fracture has yet to heal. So per the orthopedic PA, the plan is to trial bone stimulator and additional PT and reassess after bone stimulation is complete. Worst pain 5/10 NPS, is sometimes pain free. Currently just soreness. Still having difficulty with overhead ADL's like doing her hair, difficulty getting in and out her her tub, standing up from the floor. Reports no specific limitations or precautions to her R shoulder. 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   PAIN:  Are you having pain? Yes: NPRS scale: 1/10 Pain location: R shoulder Pain description: Dull/achey Aggravating factors: Overhead motion Relieving factors: rest  PRECAUTIONS: None  WEIGHT BEARING RESTRICTIONS: No  FALLS:  Has patient fallen in last 6 months? 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  PLOF: Independent  PATIENT GOALS: Improve her R shoulder AROM  NEXT MD VISIT: N/A  OBJECTIVE:    INTERVENTION THIS DATE: 12/04/22    Therex:  - AA/ROM Nustep seat 9 UE 11 L6 26min  - Sitting AAROM overhead flex with PVC pipe with half foam x12 - hooklying with half foam across upper  tspine lat pullovers 5# 2x 10 with cuing for max mobility - same positioning set up skull crusher 3# DB 2x 10 with good carry over of technique - Qped alt shoulder flex 2x 12 with cuing for thoracic ext with decent carry over -Prone thoracic ext with scapular retraction + shoulder ext x10 Post shoulder rolls x12   PATIENT EDUCATION: Education details: POC, HEP Person educated: Patient Education method: Explanation Education comprehension: verbalized understanding  HOME EXERCISE PROGRAM: Plan for primary PT to update as needed.  ASSESSMENT:  CLINICAL IMPRESSION: PT continued therex progression as able. Pt is able to comply with all cuing for proper technique of therex with good motivation throughout session and no increased pain.  Pt will continue to benefit from skilled PT services to address deficits and impairment identified in evaluation in order to maximize independence and safety in basic mobility required for performance of ADL, IADL, and leisure.     OBJECTIVE IMPAIRMENTS: decreased mobility, decreased ROM, impaired flexibility, impaired UE functional use, improper body mechanics, postural dysfunction, and pain.   ACTIVITY LIMITATIONS: carrying, lifting, sleeping, transfers, bed mobility, bathing, dressing, reach over head, and hygiene/grooming  PARTICIPATION LIMITATIONS: community activity, occupation, and yard work  PERSONAL FACTORS: Age, Past/current experiences, Profession, Time since onset of injury/illness/exacerbation, and 1 comorbidity: Hx of skin cancer  are also affecting patient's functional outcome.   REHAB POTENTIAL: Fair Chronicity of symptoms  CLINICAL DECISION MAKING: Evolving/moderate complexity  EVALUATION COMPLEXITY: Moderate  GOALS: Goals reviewed with patient? No  SHORT TERM GOALS: Target date: 11/28/22  Pt will be independent with HEP to improve R shoulder strength and  mobility to improve ability to complete overhead ADL's Baseline: 10/31/22:  Needs updated HEP Goal status: INITIAL  LONG TERM GOALS: Target date: 12/26/22  Pt will improve FOTO to target score to demonstrate clinically significant improvement in functional mobility.  Baseline: 10/31/22: 45 with target score of 66 Goal status: INITIAL  2.  Pt will decrease her QuickDash score by at least 10% to demonstrate clinically significant improvement in perceived use of RUE  with ADL's and reduced disability. Baseline: 10/31/22: 47.7% Goal status: INITIAL  3.  Pt will improve R shoulder flexion and abduction to at least 120 degrees to demonstrate ability to complete overhead ADL's like bathing and dressing. Baseline: 10/31/22: flex: 106 deg, abduction: 82 deg Goal status: INITIAL  PLAN: PT FREQUENCY: 1-2x/week  PT DURATION: 8 weeks  PLANNED INTERVENTIONS: Therapeutic exercises, Therapeutic activity, Neuromuscular re-education, Patient/Family education, Self Care, Joint mobilization, Aquatic Therapy, Dry Needling, Electrical stimulation, Spinal manipulation, Spinal mobilization, Cryotherapy, Moist heat, Manual therapy, and Re-evaluation  PLAN FOR NEXT SESSION: Update HEP, R shoulder mobility and strengthening  Durwin Reges DPT    12/04/2022, 12:40 PM

## 2022-12-07 ENCOUNTER — Ambulatory Visit: Payer: PRIVATE HEALTH INSURANCE | Admitting: Physical Therapy

## 2022-12-15 ENCOUNTER — Encounter: Payer: Self-pay | Admitting: Physical Therapy

## 2022-12-15 ENCOUNTER — Ambulatory Visit: Payer: PRIVATE HEALTH INSURANCE | Attending: Student | Admitting: Physical Therapy

## 2022-12-15 DIAGNOSIS — M6281 Muscle weakness (generalized): Secondary | ICD-10-CM

## 2022-12-15 DIAGNOSIS — M25511 Pain in right shoulder: Secondary | ICD-10-CM | POA: Insufficient documentation

## 2022-12-15 NOTE — Therapy (Signed)
OUTPATIENT PHYSICAL THERAPY TREATMENT   Patient Name: Alice Taylor MRN: [id number] DOB:03/18/1961, 62 y.o., female Today's Date: 12/15/2022  END OF SESSION:  PT End of Session - 12/15/22 0840     Visit Number 8    Number of Visits 17    Date for PT Re-Evaluation 12/26/22    Authorization Type [employer]    Authorization Time Period 10/31/22-12/26/22    Authorization - Visit Number 8    Authorization - Number of Visits 17    Progress Note Due on Visit 10    PT Start Time 0833    PT Stop Time 0912    PT Time Calculation (min) 39 min    Activity Tolerance Patient tolerated treatment well;No increased pain    Behavior During Therapy WFL for tasks assessed/performed                   Past Medical History:  Diagnosis Date   Cancer 1970   Skin   GERD (gastroesophageal reflux disease)    History of kidney stones    PONV (postoperative nausea and vomiting)    x1 after orif ankle surgery   Past Surgical History:  Procedure Laterality Date   APPENDECTOMY     KNEE ARTHROSCOPY Right    KNEE ARTHROSCOPY Left 04/24/2018   Procedure: ARTHROSCOPY KNEE, PARTIAL MEDIAL MENISCECTOMY;  Surgeon: Donato Heinz, MD;  Location: [facility];  Service: Orthopedics;  Laterality: Left;   ORIF ANKLE FRACTURE Right 01/17/2019   Procedure: OPEN REDUCTION INTERNAL FIXATION (ORIF) ANKLE FRACTURE;  Surgeon: Kennedy Bucker, MD;  Location: [facility];  Service: Orthopedics;  Laterality: Right;   ORIF WRIST FRACTURE Right 02/21/2022   Procedure: OPEN REDUCTION INTERNAL FIXATION (ORIF) OR RIGHT DISTAL RADIUS FRACTURE.;  Surgeon: Christena Flake, MD;  Location: [facility];  Service: Orthopedics;  Laterality: Right;   Patient Active Problem List   Diagnosis Date Noted   Closed right trimalleolar fracture, initial encounter 01/17/2019    PCP: Dolleschel, Denzil Hughes PROVIDER: Horris Latino   REFERRING DIAG: Fx Shoulder, Right  THERAPY DIAG:  Muscle weakness (generalized)  Rationale for  Evaluation and Treatment: Rehabilitation  ONSET DATE: Fx Shoulder, Right  SUBJECTIVE:                                                                                                                                                                                      SUBJECTIVE STATEMENT: Pt is using bone stimulator, thinks her shoulder is doing better with this. Reports no pain to date. Completing HEP   PERTINENT HISTORY: Pt is known to clinic returning due to R shoulder pain and limited AROM due to difficulty  healing of R proximal humerus fracture. Pt reports she was unable to get her R shoulder manipulation as her MRI revealed her fracture has yet to heal. So per the orthopedic PA, the plan is to trial bone stimulator and additional PT and reassess after bone stimulation is complete. Worst pain 5/10 NPS, is sometimes pain free. Currently just soreness. Still having difficulty with overhead ADL's like doing her hair, difficulty getting in and out her her tub, standing up from the floor. Reports no specific limitations or precautions to her R shoulder. 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   PAIN:  Are you having pain? Yes: NPRS scale: 1/10 Pain location: R shoulder Pain description: Dull/achey Aggravating factors: Overhead motion Relieving factors: rest  PRECAUTIONS: None  WEIGHT BEARING RESTRICTIONS: No  FALLS:  Has patient fallen in last 6 months? 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  PLOF: Independent  PATIENT GOALS: Improve her R shoulder AROM  NEXT MD VISIT: N/A  OBJECTIVE:    INTERVENTION THIS DATE: 12/15/22    Therex:  - AA/ROM Nustep seat 9 UE 11 L6 5min  - Sitting AAROM overhead flex with PVC pipe with half foam x12 - Lat pulldown 15# x10; 20# x10 with good carry over of initial cuing for proper technique - High row  10# KB 2x 8/10 with good carry over of demo - Qped alt shoulder flex 2x 12 with cuing for thoracic ext with decent carry over -Prone thoracic ext with scapular retraction + shoulder ext x12 Post shoulder rolls x12   PATIENT EDUCATION: Education details: POC, HEP Person educated: Patient Education method: Explanation Education comprehension: verbalized understanding  HOME EXERCISE PROGRAM: Plan for primary PT to update as needed.  ASSESSMENT:  CLINICAL IMPRESSION: PT continued therex progression as able. Pt is able to comply with all cuing for proper technique of therex with good motivation throughout session and no increased pain.  Pt will continue to benefit from skilled PT services to address deficits and impairment identified in evaluation in order to maximize independence and safety in basic mobility required for performance of ADL, IADL, and leisure.     OBJECTIVE IMPAIRMENTS: decreased mobility, decreased ROM, impaired flexibility, impaired UE functional use, improper body mechanics, postural dysfunction, and pain.   ACTIVITY LIMITATIONS: carrying, lifting, sleeping, transfers, bed mobility, bathing, dressing, reach over head, and hygiene/grooming  PARTICIPATION LIMITATIONS: community activity, occupation, and yard work  PERSONAL FACTORS: Age, Past/current experiences, Profession, Time since onset of injury/illness/exacerbation, and 1 comorbidity: Hx of skin cancer  are also affecting patient's functional outcome.   REHAB POTENTIAL: Fair Chronicity of symptoms  CLINICAL DECISION MAKING: Evolving/moderate complexity  EVALUATION COMPLEXITY: Moderate  GOALS: Goals reviewed with patient? No  SHORT TERM GOALS: Target date: 11/28/22  Pt will be independent with HEP to improve R shoulder strength and  mobility to improve ability to complete overhead ADL's Baseline: 10/31/22: Needs updated HEP Goal status: INITIAL  LONG TERM GOALS: Target date: 12/26/22  Pt will improve FOTO  to target score to demonstrate clinically significant improvement in functional mobility.  Baseline: 10/31/22: 45 with target score of 66 Goal status: INITIAL  2.  Pt will decrease her QuickDash score by at least 10% to demonstrate clinically significant improvement in perceived use of RUE with ADL's and reduced disability. Baseline: 10/31/22: 47.7% Goal status: INITIAL  3.  Pt will improve R shoulder flexion and abduction to at least 120 degrees to demonstrate ability to complete overhead ADL's like bathing and dressing. Baseline: 10/31/22: flex: 106 deg, abduction: 82 deg Goal status: INITIAL  PLAN: PT FREQUENCY: 1-2x/week  PT DURATION: 8 weeks  PLANNED INTERVENTIONS: Therapeutic exercises, Therapeutic activity, Neuromuscular re-education, Patient/Family education, Self Care, Joint mobilization, Aquatic Therapy, Dry Needling, Electrical stimulation, Spinal manipulation, Spinal mobilization, Cryotherapy, Moist heat, Manual therapy, and Re-evaluation  PLAN FOR NEXT SESSION: Update HEP, R shoulder mobility and strengthening  Hilda Liashelsea Gabrien Mentink DPT    12/15/2022, 9:21 AM

## 2022-12-19 ENCOUNTER — Encounter: Payer: Self-pay | Admitting: Physical Therapy

## 2022-12-19 ENCOUNTER — Ambulatory Visit: Payer: PRIVATE HEALTH INSURANCE | Admitting: Physical Therapy

## 2022-12-19 DIAGNOSIS — M6281 Muscle weakness (generalized): Secondary | ICD-10-CM | POA: Diagnosis not present

## 2022-12-19 DIAGNOSIS — M25511 Pain in right shoulder: Secondary | ICD-10-CM

## 2022-12-19 NOTE — Therapy (Signed)
OUTPATIENT PHYSICAL THERAPY TREATMENT   Patient Name: Alice Taylor MRN: [id number] DOB:[birth year], 62 y.o., female Today's Date: 12/19/2022  END OF SESSION:  PT End of Session - 12/19/22 1007     Visit Number 9    Number of Visits 17    Date for PT Re-Evaluation 12/26/22    Authorization Type [employer]    Authorization Time Period 10/31/22-12/26/22    Authorization - Visit Number 9    Authorization - Number of Visits 17    Progress Note Due on Visit 10    PT Start Time 1002    PT Stop Time 1040    PT Time Calculation (min) 38 min    Activity Tolerance Patient tolerated treatment well;No increased pain    Behavior During Therapy WFL for tasks assessed/performed                    Past Medical History:  Diagnosis Date   Cancer 1970   Skin   GERD (gastroesophageal reflux disease)    History of kidney stones    PONV (postoperative nausea and vomiting)    x1 after orif ankle surgery   Past Surgical History:  Procedure Laterality Date   APPENDECTOMY     KNEE ARTHROSCOPY Right    KNEE ARTHROSCOPY Left 04/24/2018   Procedure: ARTHROSCOPY KNEE, PARTIAL MEDIAL MENISCECTOMY;  Surgeon: Donato Heinz, MD;  Location: [facility];  Service: Orthopedics;  Laterality: Left;   ORIF ANKLE FRACTURE Right 01/17/2019   Procedure: OPEN REDUCTION INTERNAL FIXATION (ORIF) ANKLE FRACTURE;  Surgeon: Kennedy Bucker, MD;  Location: [facility];  Service: Orthopedics;  Laterality: Right;   ORIF WRIST FRACTURE Right 02/21/2022   Procedure: OPEN REDUCTION INTERNAL FIXATION (ORIF) OR RIGHT DISTAL RADIUS FRACTURE.;  Surgeon: Christena Flake, MD;  Location: [facility];  Service: Orthopedics;  Laterality: Right;   Patient Active Problem List   Diagnosis Date Noted   Closed right trimalleolar fracture, initial encounter 01/17/2019    PCP: Dolleschel, Denzil Hughes PROVIDER: Horris Latino   REFERRING DIAG: Fx Shoulder, Right  THERAPY DIAG:  Muscle weakness (generalized)  Acute pain of  right shoulder  Rationale for Evaluation and Treatment: Rehabilitation  ONSET DATE: Fx Shoulder, Right  SUBJECTIVE:                                                                                                                                                                                      SUBJECTIVE STATEMENT: Pt is using bone stimulator, thinks her shoulder is doing better with this. Reports no pain to date. Completing HEP   PERTINENT HISTORY: Pt is known to clinic returning due to R shoulder  pain and limited AROM due to difficulty healing of R proximal humerus fracture. Pt reports she was unable to get her R shoulder manipulation as her MRI revealed her fracture has yet to heal. So per the orthopedic PA, the plan is to trial bone stimulator and additional PT and reassess after bone stimulation is complete. Worst pain 5/10 NPS, is sometimes pain free. Currently just soreness. Still having difficulty with overhead ADL's like doing her hair, difficulty getting in and out her her tub, standing up from the floor. Reports no specific limitations or precautions to her R shoulder. 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   PAIN:  Are you having pain? Yes: NPRS scale: 1/10 Pain location: R shoulder Pain description: Dull/achey Aggravating factors: Overhead motion Relieving factors: rest  PRECAUTIONS: None  WEIGHT BEARING RESTRICTIONS: No  FALLS:  Has patient fallen in last 6 months? 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  PLOF: Independent  PATIENT GOALS: Improve her R shoulder AROM  NEXT MD VISIT: N/A  OBJECTIVE:    INTERVENTION THIS DATE: 12/19/22    Therex:  - AA/ROM Nustep seat 9 UE 11 L6  - Sitting AAROM overhead flex with PVC pipe with half foam x12 - Unilateral pulldown 2x 10 with good carry over of initial cuing for  proper technique - unilateral High row 10# KB 2x 10 with good carry over of demo - Qped alt shoulder flex x12; from foam pad x12 with cuing for thoracic ext with decent carry over -Y on wall x12 with min cuing for posture with good carry over Post shoulder rolls x12   PATIENT EDUCATION: Education details: POC, HEP Person educated: Patient Education method: Explanation Education comprehension: verbalized understanding  HOME EXERCISE PROGRAM: Plan for primary PT to update as needed.  ASSESSMENT:  CLINICAL IMPRESSION: PT continued therex progression as able. Pt is able to comply with all cuing for proper technique of therex with good motivation throughout session and no increased pain.  Pt will continue to benefit from skilled PT services to address deficits and impairment identified in evaluation in order to maximize independence and safety in basic mobility required for performance of ADL, IADL, and leisure.     OBJECTIVE IMPAIRMENTS: decreased mobility, decreased ROM, impaired flexibility, impaired UE functional use, improper body mechanics, postural dysfunction, and pain.   ACTIVITY LIMITATIONS: carrying, lifting, sleeping, transfers, bed mobility, bathing, dressing, reach over head, and hygiene/grooming  PARTICIPATION LIMITATIONS: community activity, occupation, and yard work  PERSONAL FACTORS: Age, Past/current experiences, Profession, Time since onset of injury/illness/exacerbation, and 1 comorbidity: Hx of skin cancer  are also affecting patient's functional outcome.   REHAB POTENTIAL: Fair Chronicity of symptoms  CLINICAL DECISION MAKING: Evolving/moderate complexity  EVALUATION COMPLEXITY: Moderate  GOALS: Goals reviewed with patient? No  SHORT TERM GOALS: Target date: 11/28/22  Pt will be independent with HEP to improve R shoulder strength and  mobility to improve ability to complete overhead ADL's Baseline: 10/31/22: Needs updated HEP Goal status: INITIAL  LONG  TERM GOALS: Target date: 12/26/22  Pt will improve FOTO to target score to demonstrate clinically significant improvement in functional mobility.  Baseline: 10/31/22: 45 with target score of 66 Goal status: INITIAL  2.  Pt will decrease her QuickDash score by at least 10% to demonstrate clinically significant improvement in perceived use of RUE with ADL's  and reduced disability. Baseline: 10/31/22: 47.7% Goal status: INITIAL  3.  Pt will improve R shoulder flexion and abduction to at least 120 degrees to demonstrate ability to complete overhead ADL's like bathing and dressing. Baseline: 10/31/22: flex: 106 deg, abduction: 82 deg Goal status: INITIAL  PLAN: PT FREQUENCY: 1-2x/week  PT DURATION: 8 weeks  PLANNED INTERVENTIONS: Therapeutic exercises, Therapeutic activity, Neuromuscular re-education, Patient/Family education, Self Care, Joint mobilization, Aquatic Therapy, Dry Needling, Electrical stimulation, Spinal manipulation, Spinal mobilization, Cryotherapy, Moist heat, Manual therapy, and Re-evaluation  PLAN FOR NEXT SESSION: Update HEP, R shoulder mobility and strengthening  Hilda Liashelsea Adnan Vanvoorhis DPT    12/19/2022, 10:43 AM

## 2022-12-20 ENCOUNTER — Ambulatory Visit: Payer: PRIVATE HEALTH INSURANCE | Admitting: Physical Therapy

## 2022-12-21 ENCOUNTER — Ambulatory Visit: Payer: PRIVATE HEALTH INSURANCE

## 2022-12-25 ENCOUNTER — Ambulatory Visit: Payer: PRIVATE HEALTH INSURANCE

## 2022-12-25 DIAGNOSIS — M6281 Muscle weakness (generalized): Secondary | ICD-10-CM

## 2022-12-25 DIAGNOSIS — M25511 Pain in right shoulder: Secondary | ICD-10-CM

## 2022-12-25 NOTE — Therapy (Signed)
OUTPATIENT PHYSICAL THERAPY TREATMENT/PHYSICAL THERAPY PROGRESS NOTE   Dates of reporting period  10/31/22   to   12/25/22    Patient Name: Alice Taylor MRN: [id number] DOB:[birth year], 62 y.o., female Today's Date: 12/25/2022  END OF SESSION:  PT End of Session - 12/25/22 1523     Visit Number 10    Number of Visits 17    Date for PT Re-Evaluation 12/26/22    Authorization Type [employer]    Authorization Time Period 10/31/22-12/26/22    Authorization - Visit Number 10    Authorization - Number of Visits 17    Progress Note Due on Visit 10    PT Start Time 1523    PT Stop Time 1600    PT Time Calculation (min) 37 min    Activity Tolerance Patient tolerated treatment well;No increased pain    Behavior During Therapy WFL for tasks assessed/performed               Past Medical History:  Diagnosis Date   Cancer 1970   Skin   GERD (gastroesophageal reflux disease)    History of kidney stones    PONV (postoperative nausea and vomiting)    x1 after orif ankle surgery   Past Surgical History:  Procedure Laterality Date   APPENDECTOMY     KNEE ARTHROSCOPY Right    KNEE ARTHROSCOPY Left 04/24/2018   Procedure: ARTHROSCOPY KNEE, PARTIAL MEDIAL MENISCECTOMY;  Surgeon: Donato Heinz, MD;  Location: [facility];  Service: Orthopedics;  Laterality: Left;   ORIF ANKLE FRACTURE Right 01/17/2019   Procedure: OPEN REDUCTION INTERNAL FIXATION (ORIF) ANKLE FRACTURE;  Surgeon: Kennedy Bucker, MD;  Location: [facility];  Service: Orthopedics;  Laterality: Right;   ORIF WRIST FRACTURE Right 02/21/2022   Procedure: OPEN REDUCTION INTERNAL FIXATION (ORIF) OR RIGHT DISTAL RADIUS FRACTURE.;  Surgeon: Christena Flake, MD;  Location: [facility];  Service: Orthopedics;  Laterality: Right;   Patient Active Problem List   Diagnosis Date Noted   Closed right trimalleolar fracture, initial encounter 01/17/2019    PCP: Dolleschel, Denzil Hughes PROVIDER: Horris Latino   REFERRING DIAG: Fx  Shoulder, Right  THERAPY DIAG:  Muscle weakness (generalized)  Acute pain of right shoulder  Rationale for Evaluation and Treatment: Rehabilitation  ONSET DATE: Fx Shoulder, Right  SUBJECTIVE:                                                                                                                                                                                      SUBJECTIVE STATEMENT:  Pt reports she is doing well.  Pt is expecting to see MD within the next few weeks.  PERTINENT HISTORY: Pt is known to clinic returning due to R shoulder pain and limited AROM due to difficulty healing of R proximal humerus fracture. Pt reports she was unable to get her R shoulder manipulation as her MRI revealed her fracture has yet to heal. So per the orthopedic PA, the plan is to trial bone stimulator and additional PT and reassess after bone stimulation is complete. Worst pain 5/10 NPS, is sometimes pain free. Currently just soreness. Still having difficulty with overhead ADL's like doing her hair, difficulty getting in and out her her tub, standing up from the floor. Reports no specific limitations or precautions to her R shoulder. 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   PAIN:  Are you having pain? Yes: NPRS scale: 1/10 Pain location: R shoulder Pain description: Dull/achey Aggravating factors: Overhead motion Relieving factors: rest  PRECAUTIONS: None  WEIGHT BEARING RESTRICTIONS: No  FALLS:  Has patient fallen in last 6 months? 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  PLOF: Independent  PATIENT GOALS: Improve her R shoulder AROM  NEXT MD VISIT: N/A  OBJECTIVE:   INTERVENTION THIS DATE: 12/25/22   TherAct:  Goal assessment performed below as part of 10th visit progress note.   PATIENT EDUCATION: Education details: POC,  HEP Person educated: Patient Education method: Explanation Education comprehension: verbalized understanding  HOME EXERCISE PROGRAM: Plan for primary PT to update as needed.  ASSESSMENT:  CLINICAL IMPRESSION:  Pt is making good progress with goals as noted below.  Pt has not met goals at this point, but has made progress towards all current goals at this time.  Therapist and patient added new goal in order to improve the functional ROM and strength of the R shoulder in order to fasten bra.  Pt advised to note any complications with duties on the farm that can be address in therapy sessions going forward.  Patient's condition has the potential to improve in response to therapy. Maximum improvement is yet to be obtained. The anticipated improvement is attainable and reasonable in a generally predictable time.   Pt will continue to benefit from skilled therapy to address remaining deficits in order to improve overall QoL and return to PLOF.       OBJECTIVE IMPAIRMENTS: decreased mobility, decreased ROM, impaired flexibility, impaired UE functional use, improper body mechanics, postural dysfunction, and pain.   ACTIVITY LIMITATIONS: carrying, lifting, sleeping, transfers, bed mobility, bathing, dressing, reach over head, and hygiene/grooming  PARTICIPATION LIMITATIONS: community activity, occupation, and yard work  PERSONAL FACTORS: Age, Past/current experiences, Profession, Time since onset of injury/illness/exacerbation, and 1 comorbidity: Hx of skin cancer  are also affecting patient's functional outcome.   REHAB POTENTIAL: Fair Chronicity of symptoms  CLINICAL DECISION MAKING: Evolving/moderate complexity  EVALUATION COMPLEXITY: Moderate  GOALS: Goals reviewed with patient? No  SHORT TERM GOALS: Target date: 11/28/22  Pt will be independent with HEP to improve R shoulder strength and  mobility to improve ability to complete overhead ADL's Baseline: 10/31/22: Needs updated HEP Goal  status: INITIAL  LONG TERM GOALS: Target date: 12/26/22  Pt will improve FOTO to target score to demonstrate clinically significant improvement in functional mobility.  Baseline: 10/31/22: 45 with target score of 66 12/25/22: 48 Goal status: IN PROGRESS  2.  Pt will decrease her QuickDash score by at least 10% to demonstrate clinically significant improvement  in perceived use of RUE with ADL's and reduced disability. Baseline: 10/31/22: 47.7% 12/25/22: 40.91% Goal status: IN PROGRESS  3.  Pt will improve R shoulder flexion and abduction to at least 120 degrees to demonstrate ability to complete overhead ADL's like bathing and dressing. Baseline: 10/31/22: flex: 106 deg, abduction: 82 deg 12/25/22: Sitting: flex: 111 deg, abduction: 116 deg; Supine: flexion: 136 deg, abduction: 124 deg Goal status: IN PROGRESS  4.  Pt will improve functional strength and ROM of the R shoulder in order to fasten bra behind back. Baseline: Pt able to reach belt line with R shoulder. Goal status: INITIAL  PLAN:  PT FREQUENCY: 1-2x/week  PT DURATION: 8 weeks  PLANNED INTERVENTIONS: Therapeutic exercises, Therapeutic activity, Neuromuscular re-education, Patient/Family education, Self Care, Joint mobilization, Aquatic Therapy, Dry Needling, Electrical stimulation, Spinal manipulation, Spinal mobilization, Cryotherapy, Moist heat, Manual therapy, and Re-evaluation  PLAN FOR NEXT SESSION:  Re-Cert at next visit. Update HEP, R shoulder mobility and strengthening   Nolon Bussing, PT, DPT Physical Therapist - [facility]  12/25/22, 4:53 PM

## 2022-12-27 ENCOUNTER — Ambulatory Visit: Payer: PRIVATE HEALTH INSURANCE

## 2022-12-28 ENCOUNTER — Ambulatory Visit: Payer: PRIVATE HEALTH INSURANCE

## 2022-12-28 DIAGNOSIS — M6281 Muscle weakness (generalized): Secondary | ICD-10-CM

## 2022-12-28 DIAGNOSIS — M25511 Pain in right shoulder: Secondary | ICD-10-CM

## 2022-12-28 NOTE — Therapy (Signed)
OUTPATIENT PHYSICAL THERAPY TREATMENT/RE-CERTIFTICATION    Patient Name: Alice Taylor MRN: [id number] DOB:[birth year], 62 y.o., female Today's Date: 12/28/2022  END OF SESSION:  PT End of Session - 12/28/22 1352     Visit Number 11    Number of Visits 17    Date for PT Re-Evaluation 12/26/22    Authorization Type [employer]    Authorization Time Period 10/31/22-12/26/22    Authorization - Visit Number 11    Authorization - Number of Visits 17    Progress Note Due on Visit 10    PT Start Time 1352    PT Stop Time 1433    PT Time Calculation (min) 41 min    Activity Tolerance Patient tolerated treatment well;No increased pain    Behavior During Therapy WFL for tasks assessed/performed               Past Medical History:  Diagnosis Date   Cancer 1970   Skin   GERD (gastroesophageal reflux disease)    History of kidney stones    PONV (postoperative nausea and vomiting)    x1 after orif ankle surgery   Past Surgical History:  Procedure Laterality Date   APPENDECTOMY     KNEE ARTHROSCOPY Right    KNEE ARTHROSCOPY Left 04/24/2018   Procedure: ARTHROSCOPY KNEE, PARTIAL MEDIAL MENISCECTOMY;  Surgeon: Donato Heinz, MD;  Location: [facility];  Service: Orthopedics;  Laterality: Left;   ORIF ANKLE FRACTURE Right 01/17/2019   Procedure: OPEN REDUCTION INTERNAL FIXATION (ORIF) ANKLE FRACTURE;  Surgeon: Kennedy Bucker, MD;  Location: [facility];  Service: Orthopedics;  Laterality: Right;   ORIF WRIST FRACTURE Right 02/21/2022   Procedure: OPEN REDUCTION INTERNAL FIXATION (ORIF) OR RIGHT DISTAL RADIUS FRACTURE.;  Surgeon: Christena Flake, MD;  Location: [facility];  Service: Orthopedics;  Laterality: Right;   Patient Active Problem List   Diagnosis Date Noted   Closed right trimalleolar fracture, initial encounter 01/17/2019    PCP: Dolleschel, Denzil Hughes PROVIDER: Horris Latino   REFERRING DIAG: Fx Shoulder, Right  THERAPY DIAG:  Muscle weakness (generalized)  Acute  pain of right shoulder  Rationale for Evaluation and Treatment: Rehabilitation  ONSET DATE: Fx Shoulder, Right  SUBJECTIVE:                                                                                                                                                                                      SUBJECTIVE STATEMENT:  Pt reports no pain upon arrival.  Pt notes no complications upon arrival.   PERTINENT HISTORY: Pt is known to clinic returning due to R shoulder pain and limited AROM due to difficulty healing of R  proximal humerus fracture. Pt reports she was unable to get her R shoulder manipulation as her MRI revealed her fracture has yet to heal. So per the orthopedic PA, the plan is to trial bone stimulator and additional PT and reassess after bone stimulation is complete. Worst pain 5/10 NPS, is sometimes pain free. Currently just soreness. Still having difficulty with overhead ADL's like doing her hair, difficulty getting in and out her her tub, standing up from the floor. Reports no specific limitations or precautions to her R shoulder. 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   PAIN:  Are you having pain? Yes: NPRS scale: 1/10 Pain location: R shoulder Pain description: Dull/achey Aggravating factors: Overhead motion Relieving factors: rest  PRECAUTIONS: None  WEIGHT BEARING RESTRICTIONS: No  FALLS:  Has patient fallen in last 6 months? 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  PLOF: Independent  PATIENT GOALS: Improve her R shoulder AROM  NEXT MD VISIT: N/A  OBJECTIVE:   INTERVENTION THIS DATE: 12/28/22   TherEx:  AA/ROM Nustep seat 9 UE 11 L6  Sitting AAROM overhead flex with PVC pipe with half foam x12 Seated lat pulldown at [employer], 25#, x15 High row 10# KB 2x 8/10 with good carry over of demo Qped alt  shoulder flex 2x12 with cuing for thoracic ext with decent carry over Prone thoracic ext with scapular retraction + shoulder ext 2x12 Seated rows at [employer], 25#, x15 Post shoulder rolls x12   PATIENT EDUCATION: Education details: POC, HEP Person educated: Patient Education method: Explanation Education comprehension: verbalized understanding  HOME EXERCISE PROGRAM: Plan for primary PT to update as needed.  ASSESSMENT:  CLINICAL IMPRESSION:  Pt performed well with the newly introduced exercises and was able to tolerate without any difficulty.  Pt put forth great effort throughout the session and is making good improvements in both ROM and strength.  Pt does still have some difficulty with full ROM that will be continued to be addressed during treatment sessions in the future.  Goals were assessed at last visit due to the it being time for progress note and are noted below.  Pt will continue to benefit from skilled therapy to address remaining deficits in order to improve overall QoL and return to PLOF.        OBJECTIVE IMPAIRMENTS: decreased mobility, decreased ROM, impaired flexibility, impaired UE functional use, improper body mechanics, postural dysfunction, and pain.   ACTIVITY LIMITATIONS: carrying, lifting, sleeping, transfers, bed mobility, bathing, dressing, reach over head, and hygiene/grooming  PARTICIPATION LIMITATIONS: community activity, occupation, and yard work  PERSONAL FACTORS: Age, Past/current experiences, Profession, Time since onset of injury/illness/exacerbation, and 1 comorbidity: Hx of skin cancer  are also affecting patient's functional outcome.   REHAB POTENTIAL: Fair Chronicity of symptoms  CLINICAL DECISION MAKING: Evolving/moderate complexity  EVALUATION COMPLEXITY: Moderate  GOALS: Goals reviewed with patient? No  SHORT TERM GOALS: Target date: 11/28/22  Pt will be independent with HEP to improve R shoulder strength and  mobility to improve  ability to complete overhead ADL's Baseline: 10/31/22: Needs updated HEP Goal status: INITIAL  LONG TERM GOALS: Target date: 12/26/22  Pt will improve FOTO to target score to demonstrate clinically significant improvement in functional mobility.  Baseline: 10/31/22: 45 with target score of 66 12/25/22: 48 Goal status: IN PROGRESS  2.  Pt will decrease her  QuickDash score by at least 10% to demonstrate clinically significant improvement in perceived use of RUE with ADL's and reduced disability. Baseline: 10/31/22: 47.7% 12/25/22: 40.91% Goal status: IN PROGRESS  3.  Pt will improve R shoulder flexion and abduction to at least 120 degrees to demonstrate ability to complete overhead ADL's like bathing and dressing. Baseline: 10/31/22: flex: 106 deg, abduction: 82 deg 12/25/22: Sitting: flex: 111 deg, abduction: 116 deg; Supine: flexion: 136 deg, abduction: 124 deg Goal status: IN PROGRESS  4.  Pt will improve functional strength and ROM of the R shoulder in order to fasten bra behind back. Baseline: Pt able to reach belt line with R shoulder. Goal status: INITIAL  PLAN:  PT FREQUENCY: 1-2x/week  PT DURATION: 8 weeks  PLANNED INTERVENTIONS: Therapeutic exercises, Therapeutic activity, Neuromuscular re-education, Patient/Family education, Self Care, Joint mobilization, Aquatic Therapy, Dry Needling, Electrical stimulation, Spinal manipulation, Spinal mobilization, Cryotherapy, Moist heat, Manual therapy, and Re-evaluation  PLAN FOR NEXT SESSION:  Re-Cert at next visit. Update HEP, R shoulder mobility and strengthening   Nolon Bussing, PT, DPT Physical Therapist - [facility]  12/28/22, 2:41 PM

## 2023-01-02 ENCOUNTER — Ambulatory Visit: Payer: PRIVATE HEALTH INSURANCE

## 2023-01-02 DIAGNOSIS — M6281 Muscle weakness (generalized): Secondary | ICD-10-CM

## 2023-01-02 DIAGNOSIS — M25511 Pain in right shoulder: Secondary | ICD-10-CM

## 2023-01-02 NOTE — Therapy (Signed)
OUTPATIENT PHYSICAL THERAPY TREATMENT    Patient Name: Alice Taylor MRN: [id number] DOB:1961/07/21, 62 y.o., female Today's Date: 01/02/2023  END OF SESSION:  PT End of Session - 01/02/23 1438     Visit Number 12    Number of Visits 19    Date for PT Re-Evaluation 01/25/23    Authorization Type [employer]    Authorization Time Period 10/31/22-12/26/22    Authorization - Visit Number 12    Authorization - Number of Visits 17    Progress Note Due on Visit 10    PT Start Time 1435    PT Stop Time 1514    PT Time Calculation (min) 39 min    Activity Tolerance Patient tolerated treatment well;No increased pain    Behavior During Therapy WFL for tasks assessed/performed               Past Medical History:  Diagnosis Date   Cancer 1970   Skin   GERD (gastroesophageal reflux disease)    History of kidney stones    PONV (postoperative nausea and vomiting)    x1 after orif ankle surgery   Past Surgical History:  Procedure Laterality Date   APPENDECTOMY     KNEE ARTHROSCOPY Right    KNEE ARTHROSCOPY Left 04/24/2018   Procedure: ARTHROSCOPY KNEE, PARTIAL MEDIAL MENISCECTOMY;  Surgeon: Donato Heinz, MD;  Location: [facility];  Service: Orthopedics;  Laterality: Left;   ORIF ANKLE FRACTURE Right 01/17/2019   Procedure: OPEN REDUCTION INTERNAL FIXATION (ORIF) ANKLE FRACTURE;  Surgeon: Kennedy Bucker, MD;  Location: [facility];  Service: Orthopedics;  Laterality: Right;   ORIF WRIST FRACTURE Right 02/21/2022   Procedure: OPEN REDUCTION INTERNAL FIXATION (ORIF) OR RIGHT DISTAL RADIUS FRACTURE.;  Surgeon: Christena Flake, MD;  Location: [facility];  Service: Orthopedics;  Laterality: Right;   Patient Active Problem List   Diagnosis Date Noted   Closed right trimalleolar fracture, initial encounter 01/17/2019    PCP: Dolleschel, Denzil Hughes PROVIDER: Horris Latino   REFERRING DIAG: Fx Shoulder, Right  THERAPY DIAG:  Muscle weakness (generalized)  Acute pain of right  shoulder  Rationale for Evaluation and Treatment: Rehabilitation  ONSET DATE: Fx Shoulder, Right  SUBJECTIVE:                                                                                                                                                                                      SUBJECTIVE STATEMENT:  Pt reports no pain upon arrival.  Pt notes no complications upon arrival.   PERTINENT HISTORY: Pt is known to clinic returning due to R shoulder pain and limited AROM due to difficulty healing of R  proximal humerus fracture. Pt reports she was unable to get her R shoulder manipulation as her MRI revealed her fracture has yet to heal. So per the orthopedic PA, the plan is to trial bone stimulator and additional PT and reassess after bone stimulation is complete. Worst pain 5/10 NPS, is sometimes pain free. Currently just soreness. Still having difficulty with overhead ADL's like doing her hair, difficulty getting in and out her her tub, standing up from the floor. Reports no specific limitations or precautions to her R shoulder. 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   PAIN:  Are you having pain? Yes: NPRS scale: 1/10 Pain location: R shoulder Pain description: Dull/achey Aggravating factors: Overhead motion Relieving factors: rest  PRECAUTIONS: None  WEIGHT BEARING RESTRICTIONS: No  FALLS:  Has patient fallen in last 6 months? 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  PLOF: Independent  PATIENT GOALS: Improve her R shoulder AROM  NEXT MD VISIT: N/A  OBJECTIVE:   INTERVENTION THIS DATE: 01/02/23  TherEx:  AA/ROM Nustep seat 9 UE 8 L5  Standing rows at [employer] 25# 2 x 10  Seated Lat pull down at [employer] 25# 2 x 10 Paloff press at [employer] 15# x 15 each side  Shoulder alphabet with green physioball on wall  Y on wall with  lift x 15  Standing shoulder abduction stretch x 10 with 3-5 seconds hold  Doorway pec stretch 5 x 30 seconds GTB horizontal abduction diagonally x 10 each direction GTB horizontal abduction x 15  Wall push ups x 15    PATIENT EDUCATION: Education details: POC, HEP Person educated: Patient Education method: Explanation Education comprehension: verbalized understanding  HOME EXERCISE PROGRAM: Plan for primary PT to update as needed.  ASSESSMENT:  CLINICAL IMPRESSION: Patient presented to treatment session motivated and eager to continue to improve and requesting to increase intensity as tolerated. Continues to be limited by impaired R shoulder AROM 2/2 weakness and muscle tightness. Provided patient with green theraband for home use to increase resistance for HEP. Patient will continue to benefit from skilled therapy to address remaining deficits in order to improve quality of life and return to PLOF.    OBJECTIVE IMPAIRMENTS: decreased mobility, decreased ROM, impaired flexibility, impaired UE functional use, improper body mechanics, postural dysfunction, and pain.   ACTIVITY LIMITATIONS: carrying, lifting, sleeping, transfers, bed mobility, bathing, dressing, reach over head, and hygiene/grooming  PARTICIPATION LIMITATIONS: community activity, occupation, and yard work  PERSONAL FACTORS: Age, Past/current experiences, Profession, Time since onset of injury/illness/exacerbation, and 1 comorbidity: Hx of skin cancer  are also affecting patient's functional outcome.   REHAB POTENTIAL: Fair Chronicity of symptoms  CLINICAL DECISION MAKING: Evolving/moderate complexity  EVALUATION COMPLEXITY: Moderate  GOALS: Goals reviewed with patient? No  SHORT TERM GOALS: Target date: 11/28/22  Pt will be independent with HEP to improve R shoulder strength and  mobility to improve ability to complete overhead ADL's Baseline: 10/31/22: Needs updated HEP Goal status: INITIAL  LONG TERM  GOALS: Target date: 12/26/22  Pt will improve FOTO to target score to demonstrate clinically significant improvement in functional mobility.  Baseline: 10/31/22: 45 with target score of 66 12/25/22: 48 Goal status: IN PROGRESS  2.  Pt will decrease her QuickDash score by at least 10% to demonstrate clinically significant improvement in perceived use of RUE with ADL's and  reduced disability. Baseline: 10/31/22: 47.7% 12/25/22: 40.91% Goal status: IN PROGRESS  3.  Pt will improve R shoulder flexion and abduction to at least 120 degrees to demonstrate ability to complete overhead ADL's like bathing and dressing. Baseline: 10/31/22: flex: 106 deg, abduction: 82 deg 12/25/22: Sitting: flex: 111 deg, abduction: 116 deg; Supine: flexion: 136 deg, abduction: 124 deg Goal status: IN PROGRESS  4.  Pt will improve functional strength and ROM of the R shoulder in order to fasten bra behind back. Baseline: Pt able to reach belt line with R shoulder. Goal status: INITIAL  PLAN:  PT FREQUENCY: 1-2x/week  PT DURATION: 8 weeks  PLANNED INTERVENTIONS: Therapeutic exercises, Therapeutic activity, Neuromuscular re-education, Patient/Family education, Self Care, Joint mobilization, Aquatic Therapy, Dry Needling, Electrical stimulation, Spinal manipulation, Spinal mobilization, Cryotherapy, Moist heat, Manual therapy, and Re-evaluation  PLAN FOR NEXT SESSION:  R shoulder mobility and strengthening for stabilization   Joeanna Howdyshell A. Dan Humphreys PT, DPT  Physical Therapist - [facility]  01/02/23, 5:14 PM

## 2023-01-04 ENCOUNTER — Ambulatory Visit: Payer: PRIVATE HEALTH INSURANCE

## 2023-01-04 DIAGNOSIS — M6281 Muscle weakness (generalized): Secondary | ICD-10-CM | POA: Diagnosis not present

## 2023-01-04 DIAGNOSIS — M25511 Pain in right shoulder: Secondary | ICD-10-CM

## 2023-01-04 NOTE — Therapy (Signed)
OUTPATIENT PHYSICAL THERAPY TREATMENT    Patient Name: Alice Taylor MRN: [id number] DOB:1961-01-08, 62 y.o., female Today's Date: 01/04/2023  END OF SESSION:  PT End of Session - 01/04/23 1000     Visit Number 13    Number of Visits 19    Date for PT Re-Evaluation 01/25/23    Authorization Type [employer]    Authorization Time Period 10/31/22-12/26/22    Authorization - Visit Number 13    Authorization - Number of Visits 17    Progress Note Due on Visit 10    PT Start Time 1000    PT Stop Time 1048    PT Time Calculation (min) 48 min    Activity Tolerance Patient tolerated treatment well;No increased pain    Behavior During Therapy WFL for tasks assessed/performed               Past Medical History:  Diagnosis Date   Cancer 1970   Skin   GERD (gastroesophageal reflux disease)    History of kidney stones    PONV (postoperative nausea and vomiting)    x1 after orif ankle surgery   Past Surgical History:  Procedure Laterality Date   APPENDECTOMY     KNEE ARTHROSCOPY Right    KNEE ARTHROSCOPY Left 04/24/2018   Procedure: ARTHROSCOPY KNEE, PARTIAL MEDIAL MENISCECTOMY;  Surgeon: Donato Heinz, MD;  Location: [facility];  Service: Orthopedics;  Laterality: Left;   ORIF ANKLE FRACTURE Right 01/17/2019   Procedure: OPEN REDUCTION INTERNAL FIXATION (ORIF) ANKLE FRACTURE;  Surgeon: Kennedy Bucker, MD;  Location: [facility];  Service: Orthopedics;  Laterality: Right;   ORIF WRIST FRACTURE Right 02/21/2022   Procedure: OPEN REDUCTION INTERNAL FIXATION (ORIF) OR RIGHT DISTAL RADIUS FRACTURE.;  Surgeon: Christena Flake, MD;  Location: [facility];  Service: Orthopedics;  Laterality: Right;   Patient Active Problem List   Diagnosis Date Noted   Closed right trimalleolar fracture, initial encounter 01/17/2019    PCP: Dolleschel, Denzil Hughes PROVIDER: Horris Latino   REFERRING DIAG: Fx Shoulder, Right  THERAPY DIAG:  Muscle weakness (generalized)  Acute pain of right  shoulder  Rationale for Evaluation and Treatment: Rehabilitation  ONSET DATE: Fx Shoulder, Right  SUBJECTIVE:                                                                                                                                                                                      SUBJECTIVE STATEMENT: Patient reports mild soreness from previous session but otherwise feeling good.    PERTINENT HISTORY: Pt is known to clinic returning due to R shoulder pain and limited AROM due to difficulty healing of R proximal humerus  fracture. Pt reports she was unable to get her R shoulder manipulation as her MRI revealed her fracture has yet to heal. So per the orthopedic PA, the plan is to trial bone stimulator and additional PT and reassess after bone stimulation is complete. Worst pain 5/10 NPS, is sometimes pain free. Currently just soreness. Still having difficulty with overhead ADL's like doing her hair, difficulty getting in and out her her tub, standing up from the floor. Reports no specific limitations or precautions to her R shoulder. 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   PAIN:  Are you having pain? Yes: NPRS scale: 1/10 Pain location: R shoulder Pain description: Dull/achey Aggravating factors: Overhead motion Relieving factors: rest  PRECAUTIONS: None  WEIGHT BEARING RESTRICTIONS: No  FALLS:  Has patient fallen in last 6 months? 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  PLOF: Independent  PATIENT GOALS: Improve her R shoulder AROM  NEXT MD VISIT: N/A  OBJECTIVE:   INTERVENTION THIS DATE: 01/04/23  TherEx:  AA/ROM Nustep seat 8 UE 8 L4  Seated row at [facility] 25# 2 x 10  Seated lat pull down at [employer] 25# 2 x 10 Paloff press at [employer] 15# x 15 each side  High row 10# KB 2 x 10 Y on wall with lift x 20 Shoulder  alphabet with 2kg medicine ball on wall in shoulder flexion    PATIENT EDUCATION: Education details: POC, HEP Person educated: Patient Education method: Explanation Education comprehension: verbalized understanding  HOME EXERCISE PROGRAM: Plan for primary PT to update as needed.  ASSESSMENT:  CLINICAL IMPRESSION: Patient continues to show improvement towards physical therapy goals. Continues to demonstrate R shoulder weakness and ROM deficit. Able to tolerate intensity of exercises although reporting mild soreness after previous session. Patient will continue to benefit from skilled therapy to address remaining deficits in order to improve quality of life and return to PLOF.    OBJECTIVE IMPAIRMENTS: decreased mobility, decreased ROM, impaired flexibility, impaired UE functional use, improper body mechanics, postural dysfunction, and pain.   ACTIVITY LIMITATIONS: carrying, lifting, sleeping, transfers, bed mobility, bathing, dressing, reach over head, and hygiene/grooming  PARTICIPATION LIMITATIONS: community activity, occupation, and yard work  PERSONAL FACTORS: Age, Past/current experiences, Profession, Time since onset of injury/illness/exacerbation, and 1 comorbidity: Hx of skin cancer  are also affecting patient's functional outcome.   REHAB POTENTIAL: Fair Chronicity of symptoms  CLINICAL DECISION MAKING: Evolving/moderate complexity  EVALUATION COMPLEXITY: Moderate  GOALS: Goals reviewed with patient? No  SHORT TERM GOALS: Target date: 11/28/22  Pt will be independent with HEP to improve R shoulder strength and  mobility to improve ability to complete overhead ADL's Baseline: 10/31/22: Needs updated HEP Goal status: INITIAL  LONG TERM GOALS: Target date: 12/26/22  Pt will improve FOTO to target score to demonstrate clinically significant improvement in functional mobility.  Baseline: 10/31/22: 45 with target score of 66 12/25/22: 48 Goal status: IN PROGRESS  2.  Pt  will decrease her QuickDash score by at least 10% to demonstrate clinically significant improvement in perceived use of RUE with ADL's and reduced disability. Baseline: 10/31/22: 47.7% 12/25/22: 40.91% Goal status: IN PROGRESS  3.  Pt will improve R shoulder flexion and abduction to at least 120 degrees to demonstrate ability to complete overhead ADL's like bathing and dressing. Baseline: 10/31/22: flex: 106 deg, abduction: 82  deg 12/25/22: Sitting: flex: 111 deg, abduction: 116 deg; Supine: flexion: 136 deg, abduction: 124 deg Goal status: IN PROGRESS  4.  Pt will improve functional strength and ROM of the R shoulder in order to fasten bra behind back. Baseline: Pt able to reach belt line with R shoulder. Goal status: INITIAL  PLAN:  PT FREQUENCY: 1-2x/week  PT DURATION: 8 weeks  PLANNED INTERVENTIONS: Therapeutic exercises, Therapeutic activity, Neuromuscular re-education, Patient/Family education, Self Care, Joint mobilization, Aquatic Therapy, Dry Needling, Electrical stimulation, Spinal manipulation, Spinal mobilization, Cryotherapy, Moist heat, Manual therapy, and Re-evaluation  PLAN FOR NEXT SESSION:  R shoulder mobility and strengthening for stabilization   Layli Capshaw A. Dan Humphreys PT, DPT  Physical Therapist - [facility]  01/04/23, 11:56 AM

## 2023-01-08 ENCOUNTER — Ambulatory Visit: Payer: PRIVATE HEALTH INSURANCE

## 2023-01-08 DIAGNOSIS — M6281 Muscle weakness (generalized): Secondary | ICD-10-CM

## 2023-01-08 DIAGNOSIS — M25511 Pain in right shoulder: Secondary | ICD-10-CM

## 2023-01-08 NOTE — Therapy (Signed)
OUTPATIENT PHYSICAL THERAPY TREATMENT    Patient Name: Alice Taylor MRN: [id number] DOB:December 08, 1960, 62 y.o., female Today's Date: 01/08/2023  END OF SESSION:  PT End of Session - 01/08/23 1519     Visit Number 14    Number of Visits 19    Date for PT Re-Evaluation 01/25/23    Authorization Type [employer]    Authorization Time Period 10/31/22-12/26/22    Authorization - Visit Number 14    Authorization - Number of Visits 17    Progress Note Due on Visit 10    PT Start Time 1519    PT Stop Time 1600    PT Time Calculation (min) 41 min    Activity Tolerance Patient tolerated treatment well;No increased pain    Behavior During Therapy WFL for tasks assessed/performed               Past Medical History:  Diagnosis Date   Cancer (HCC) 1970   Skin   GERD (gastroesophageal reflux disease)    History of kidney stones    PONV (postoperative nausea and vomiting)    x1 after orif ankle surgery   Past Surgical History:  Procedure Laterality Date   APPENDECTOMY     KNEE ARTHROSCOPY Right    KNEE ARTHROSCOPY Left 04/24/2018   Procedure: ARTHROSCOPY KNEE, PARTIAL MEDIAL MENISCECTOMY;  Surgeon: Donato Heinz, MD;  Location: [facility];  Service: Orthopedics;  Laterality: Left;   ORIF ANKLE FRACTURE Right 01/17/2019   Procedure: OPEN REDUCTION INTERNAL FIXATION (ORIF) ANKLE FRACTURE;  Surgeon: Kennedy Bucker, MD;  Location: [facility];  Service: Orthopedics;  Laterality: Right;   ORIF WRIST FRACTURE Right 02/21/2022   Procedure: OPEN REDUCTION INTERNAL FIXATION (ORIF) OR RIGHT DISTAL RADIUS FRACTURE.;  Surgeon: Christena Flake, MD;  Location: [facility];  Service: Orthopedics;  Laterality: Right;   Patient Active Problem List   Diagnosis Date Noted   Closed right trimalleolar fracture, initial encounter 01/17/2019    PCP: Dolleschel, Denzil Hughes PROVIDER: Horris Latino   REFERRING DIAG: Fx Shoulder, Right  THERAPY DIAG:  Muscle weakness (generalized)  Acute pain of  right shoulder  Rationale for Evaluation and Treatment: Rehabilitation  ONSET DATE: Fx Shoulder, Right  SUBJECTIVE:                                                                                                                                                                                      SUBJECTIVE STATEMENT:  Pt reports she is doing well other than her wrist/hand swelling.     PERTINENT HISTORY: Pt is known to clinic returning due to R shoulder pain and limited AROM due to difficulty healing of  R proximal humerus fracture. Pt reports she was unable to get her R shoulder manipulation as her MRI revealed her fracture has yet to heal. So per the orthopedic PA, the plan is to trial bone stimulator and additional PT and reassess after bone stimulation is complete. Worst pain 5/10 NPS, is sometimes pain free. Currently just soreness. Still having difficulty with overhead ADL's like doing her hair, difficulty getting in and out her her tub, standing up from the floor. Reports no specific limitations or precautions to her R shoulder. 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   PAIN:  Are you having pain? Yes: NPRS scale: 1/10 Pain location: R shoulder Pain description: Dull/achey Aggravating factors: Overhead motion Relieving factors: rest  PRECAUTIONS: None  WEIGHT BEARING RESTRICTIONS: No  FALLS:  Has patient fallen in last 6 months? 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  PLOF: Independent  PATIENT GOALS: Improve her R shoulder AROM  NEXT MD VISIT: N/A  OBJECTIVE:   INTERVENTION THIS DATE: 01/08/23  TherEx:  AA/ROM Nustep seat 8 UE 8 L4  Seated row at [employer] 35# 3x10  Seated lat pull down at [employer] 35# 3x10 Paloff press at [employer] 20# x 15 each side  High row 10# KB 2x10 Y on wall with lift x 20 Shoulder alphabet with 2kg  medicine ball on wall in shoulder flexion Shoulder extension with PVC and 7.5# AW attached, 2x10    PATIENT EDUCATION: Education details: POC, HEP Person educated: Patient Education method: Explanation Education comprehension: verbalized understanding  HOME EXERCISE PROGRAM: Plan for primary PT to update as needed.  ASSESSMENT:  CLINICAL IMPRESSION:  Pt continues to perform well with the exercises given although she is experiencing some increased swelling in the R hand.  Pt advised to go home and utilize the paraffin bath in order to minimize the swelling she is experiencing at this time.  Pt is able to achieve greater ROM and improve her overall tolerance to resistance with exercises today.   Pt will continue to benefit from skilled therapy to address remaining deficits in order to improve overall QoL and return to PLOF.      OBJECTIVE IMPAIRMENTS: decreased mobility, decreased ROM, impaired flexibility, impaired UE functional use, improper body mechanics, postural dysfunction, and pain.   ACTIVITY LIMITATIONS: carrying, lifting, sleeping, transfers, bed mobility, bathing, dressing, reach over head, and hygiene/grooming  PARTICIPATION LIMITATIONS: community activity, occupation, and yard work  PERSONAL FACTORS: Age, Past/current experiences, Profession, Time since onset of injury/illness/exacerbation, and 1 comorbidity: Hx of skin cancer  are also affecting patient's functional outcome.   REHAB POTENTIAL: Fair Chronicity of symptoms  CLINICAL DECISION MAKING: Evolving/moderate complexity  EVALUATION COMPLEXITY: Moderate  GOALS: Goals reviewed with patient? No  SHORT TERM GOALS: Target date: 11/28/22  Pt will be independent with HEP to improve R shoulder strength and  mobility to improve ability to complete overhead ADL's Baseline: 10/31/22: Needs updated HEP Goal status: INITIAL  LONG TERM GOALS: Target date: 12/26/22  Pt will improve FOTO to target score to demonstrate  clinically significant improvement in functional mobility.  Baseline: 10/31/22: 45 with target score of 66 12/25/22: 48 Goal status: IN PROGRESS  2.  Pt will decrease her QuickDash score by at least 10% to demonstrate clinically significant improvement in perceived use of RUE with ADL's and reduced disability. Baseline: 10/31/22: 47.7%  12/25/22: 40.91% Goal status: IN PROGRESS  3.  Pt will improve R shoulder flexion and abduction to at least 120 degrees to demonstrate ability to complete overhead ADL's like bathing and dressing. Baseline: 10/31/22: flex: 106 deg, abduction: 82 deg 12/25/22: Sitting: flex: 111 deg, abduction: 116 deg; Supine: flexion: 136 deg, abduction: 124 deg Goal status: IN PROGRESS  4.  Pt will improve functional strength and ROM of the R shoulder in order to fasten bra behind back. Baseline: Pt able to reach belt line with R shoulder. Goal status: INITIAL  PLAN:  PT FREQUENCY: 1-2x/week  PT DURATION: 8 weeks  PLANNED INTERVENTIONS: Therapeutic exercises, Therapeutic activity, Neuromuscular re-education, Patient/Family education, Self Care, Joint mobilization, Aquatic Therapy, Dry Needling, Electrical stimulation, Spinal manipulation, Spinal mobilization, Cryotherapy, Moist heat, Manual therapy, and Re-evaluation  PLAN FOR NEXT SESSION:  R shoulder mobility and strengthening for stabilization    Nolon Bussing, PT, DPT Physical Therapist - [facility]  [facility]  01/08/23, 4:20 PM

## 2023-01-11 ENCOUNTER — Ambulatory Visit: Payer: PRIVATE HEALTH INSURANCE

## 2023-01-16 ENCOUNTER — Ambulatory Visit: Payer: PRIVATE HEALTH INSURANCE | Attending: Student

## 2023-01-16 DIAGNOSIS — M25641 Stiffness of right hand, not elsewhere classified: Secondary | ICD-10-CM | POA: Diagnosis present

## 2023-01-16 DIAGNOSIS — M25631 Stiffness of right wrist, not elsewhere classified: Secondary | ICD-10-CM

## 2023-01-16 DIAGNOSIS — M79641 Pain in right hand: Secondary | ICD-10-CM

## 2023-01-16 DIAGNOSIS — M6281 Muscle weakness (generalized): Secondary | ICD-10-CM | POA: Diagnosis present

## 2023-01-16 DIAGNOSIS — M25511 Pain in right shoulder: Secondary | ICD-10-CM | POA: Diagnosis present

## 2023-01-16 DIAGNOSIS — G8929 Other chronic pain: Secondary | ICD-10-CM | POA: Diagnosis present

## 2023-01-16 NOTE — Therapy (Signed)
OUTPATIENT PHYSICAL THERAPY TREATMENT    Patient Name: Alice Taylor MRN: [id number] DOB:1961-01-15, 62 y.o., female Today's Date: 01/16/2023  END OF SESSION:  PT End of Session - 01/16/23 1351     Visit Number 15    Number of Visits 19    Date for PT Re-Evaluation 01/25/23    Authorization Type [employer]    Authorization Time Period 10/31/22-12/26/22    Authorization - Visit Number 15    Authorization - Number of Visits 17    Progress Note Due on Visit 10    PT Start Time 1351    PT Stop Time 1430    PT Time Calculation (min) 39 min    Activity Tolerance Patient tolerated treatment well;No increased pain    Behavior During Therapy WFL for tasks assessed/performed               Past Medical History:  Diagnosis Date   Cancer (HCC) 1970   Skin   GERD (gastroesophageal reflux disease)    History of kidney stones    PONV (postoperative nausea and vomiting)    x1 after orif ankle surgery   Past Surgical History:  Procedure Laterality Date   APPENDECTOMY     KNEE ARTHROSCOPY Right    KNEE ARTHROSCOPY Left 04/24/2018   Procedure: ARTHROSCOPY KNEE, PARTIAL MEDIAL MENISCECTOMY;  Surgeon: Donato Heinz, MD;  Location: [facility];  Service: Orthopedics;  Laterality: Left;   ORIF ANKLE FRACTURE Right 01/17/2019   Procedure: OPEN REDUCTION INTERNAL FIXATION (ORIF) ANKLE FRACTURE;  Surgeon: Kennedy Bucker, MD;  Location: [facility];  Service: Orthopedics;  Laterality: Right;   ORIF WRIST FRACTURE Right 02/21/2022   Procedure: OPEN REDUCTION INTERNAL FIXATION (ORIF) OR RIGHT DISTAL RADIUS FRACTURE.;  Surgeon: Christena Flake, MD;  Location: [facility];  Service: Orthopedics;  Laterality: Right;   Patient Active Problem List   Diagnosis Date Noted   Closed right trimalleolar fracture, initial encounter 01/17/2019    PCP: Dolleschel, Denzil Hughes PROVIDER: Horris Latino   REFERRING DIAG: Fx Shoulder, Right  THERAPY DIAG:  No diagnosis found.  Rationale for Evaluation and  Treatment: Rehabilitation  ONSET DATE: Fx Shoulder, Right  SUBJECTIVE:                                                                                                                                                                                      SUBJECTIVE STATEMENT:  Pt reports she is doing well and has been dealing with hay lately.  She had a good report from MD.   PERTINENT HISTORY: Pt is known to clinic returning due to R shoulder pain and limited AROM due to difficulty  healing of R proximal humerus fracture. Pt reports she was unable to get her R shoulder manipulation as her MRI revealed her fracture has yet to heal. So per the orthopedic PA, the plan is to trial bone stimulator and additional PT and reassess after bone stimulation is complete. Worst pain 5/10 NPS, is sometimes pain free. Currently just soreness. Still having difficulty with overhead ADL's like doing her hair, difficulty getting in and out her her tub, standing up from the floor. Reports no specific limitations or precautions to her R shoulder. 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   PAIN:  Are you having pain? Yes: NPRS scale: 1/10 Pain location: R shoulder Pain description: Dull/achey Aggravating factors: Overhead motion Relieving factors: rest  PRECAUTIONS: None  WEIGHT BEARING RESTRICTIONS: No  FALLS:  Has patient fallen in last 6 months? 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  PLOF: Independent  PATIENT GOALS: Improve her R shoulder AROM  NEXT MD VISIT: N/A  OBJECTIVE:   INTERVENTION THIS DATE: 01/16/23  TherEx:  Seated row at [facility] 35# 3x10  Seated lat pull down at [employer] 35# 3x10 Paloff press at [employer] 20# x 15 each side  High row 10# KB 2x10 Y on wall with lift x 20 Standing shoulder circles in forward flexion, CW/CCW, 30sec bouts x4 each  direction     PATIENT EDUCATION: Education details: POC, HEP Person educated: Patient Education method: Explanation Education comprehension: verbalized understanding  HOME EXERCISE PROGRAM: Plan for primary PT to update as needed.  ASSESSMENT:  CLINICAL IMPRESSION:  Pt is improving and was able to achieve a good report by MD.  Pt somewhat confused by the report, but overall is making good improvements and is most notably concerned with the swelling and weakness in the wrist/hand.  Pt will continue to benefit from continued therapy to address these deficits in the future sessions and target overall strength.      OBJECTIVE IMPAIRMENTS: decreased mobility, decreased ROM, impaired flexibility, impaired UE functional use, improper body mechanics, postural dysfunction, and pain.   ACTIVITY LIMITATIONS: carrying, lifting, sleeping, transfers, bed mobility, bathing, dressing, reach over head, and hygiene/grooming  PARTICIPATION LIMITATIONS: community activity, occupation, and yard work  PERSONAL FACTORS: Age, Past/current experiences, Profession, Time since onset of injury/illness/exacerbation, and 1 comorbidity: Hx of skin cancer  are also affecting patient's functional outcome.   REHAB POTENTIAL: Fair Chronicity of symptoms  CLINICAL DECISION MAKING: Evolving/moderate complexity  EVALUATION COMPLEXITY: Moderate  GOALS: Goals reviewed with patient? No  SHORT TERM GOALS: Target date: 11/28/22  Pt will be independent with HEP to improve R shoulder strength and  mobility to improve ability to complete overhead ADL's Baseline: 10/31/22: Needs updated HEP Goal status: INITIAL  LONG TERM GOALS: Target date: 12/26/22  Pt will improve FOTO to target score to demonstrate clinically significant improvement in functional mobility.  Baseline: 10/31/22: 45 with target score of 66 12/25/22: 48 Goal status: IN PROGRESS  2.  Pt will decrease her QuickDash score by at least 10% to demonstrate  clinically significant improvement in perceived use of RUE with ADL's and reduced disability. Baseline: 10/31/22: 47.7% 12/25/22: 40.91% Goal status: IN PROGRESS  3.  Pt will improve R shoulder flexion and abduction to at least 120 degrees to demonstrate ability to complete overhead ADL's like bathing and dressing. Baseline: 10/31/22: flex: 106 deg, abduction:  82 deg 12/25/22: Sitting: flex: 111 deg, abduction: 116 deg; Supine: flexion: 136 deg, abduction: 124 deg Goal status: IN PROGRESS  4.  Pt will improve functional strength and ROM of the R shoulder in order to fasten bra behind back. Baseline: Pt able to reach belt line with R shoulder. Goal status: INITIAL  PLAN:  PT FREQUENCY: 1-2x/week  PT DURATION: 8 weeks  PLANNED INTERVENTIONS: Therapeutic exercises, Therapeutic activity, Neuromuscular re-education, Patient/Family education, Self Care, Joint mobilization, Aquatic Therapy, Dry Needling, Electrical stimulation, Spinal manipulation, Spinal mobilization, Cryotherapy, Moist heat, Manual therapy, and Re-evaluation  PLAN FOR NEXT SESSION:  R shoulder mobility and strengthening for stabilization    Nolon Bussing, PT, DPT Physical Therapist - [facility]  [facility]  01/16/23, 1:52 PM

## 2023-01-18 ENCOUNTER — Ambulatory Visit: Payer: PRIVATE HEALTH INSURANCE

## 2023-01-18 DIAGNOSIS — M6281 Muscle weakness (generalized): Secondary | ICD-10-CM | POA: Diagnosis not present

## 2023-01-18 DIAGNOSIS — M25511 Pain in right shoulder: Secondary | ICD-10-CM

## 2023-01-18 NOTE — Therapy (Signed)
OUTPATIENT PHYSICAL THERAPY TREATMENT    Patient Name: Alice Taylor MRN: [id number] DOB:10-03-1960, 62 y.o., female Today's Date: 01/18/2023  END OF SESSION:  PT End of Session - 01/18/23 1354     Visit Number 16    Number of Visits 19    Date for PT Re-Evaluation 01/25/23    Authorization Type [employer]    Authorization Time Period 10/31/22-12/26/22    Authorization - Visit Number 16    Authorization - Number of Visits 17    Progress Note Due on Visit 20    PT Start Time 1354    PT Stop Time 1430    PT Time Calculation (min) 36 min    Activity Tolerance Patient tolerated treatment well;No increased pain    Behavior During Therapy WFL for tasks assessed/performed               Past Medical History:  Diagnosis Date   Cancer (HCC) 1970   Skin   GERD (gastroesophageal reflux disease)    History of kidney stones    PONV (postoperative nausea and vomiting)    x1 after orif ankle surgery   Past Surgical History:  Procedure Laterality Date   APPENDECTOMY     KNEE ARTHROSCOPY Right    KNEE ARTHROSCOPY Left 04/24/2018   Procedure: ARTHROSCOPY KNEE, PARTIAL MEDIAL MENISCECTOMY;  Surgeon: Donato Heinz, MD;  Location: [facility];  Service: Orthopedics;  Laterality: Left;   ORIF ANKLE FRACTURE Right 01/17/2019   Procedure: OPEN REDUCTION INTERNAL FIXATION (ORIF) ANKLE FRACTURE;  Surgeon: Kennedy Bucker, MD;  Location: [facility];  Service: Orthopedics;  Laterality: Right;   ORIF WRIST FRACTURE Right 02/21/2022   Procedure: OPEN REDUCTION INTERNAL FIXATION (ORIF) OR RIGHT DISTAL RADIUS FRACTURE.;  Surgeon: Christena Flake, MD;  Location: [facility];  Service: Orthopedics;  Laterality: Right;   Patient Active Problem List   Diagnosis Date Noted   Closed right trimalleolar fracture, initial encounter 01/17/2019    PCP: Dolleschel, Denzil Hughes PROVIDER: Horris Latino   REFERRING DIAG: Fx Shoulder, Right  THERAPY DIAG:  Muscle weakness (generalized)  Acute pain of  right shoulder  Rationale for Evaluation and Treatment: Rehabilitation  ONSET DATE: Fx Shoulder, Right  SUBJECTIVE:                                                                                                                                                                                      SUBJECTIVE STATEMENT:  Pt reports she is doing well, but was hoeing the garden for 4 hours and notes some slight discomfort at this time.  Pt notes the discomfort is mostly just fatigue.   PERTINENT HISTORY: Pt  is known to clinic returning due to R shoulder pain and limited AROM due to difficulty healing of R proximal humerus fracture. Pt reports she was unable to get her R shoulder manipulation as her MRI revealed her fracture has yet to heal. So per the orthopedic PA, the plan is to trial bone stimulator and additional PT and reassess after bone stimulation is complete. Worst pain 5/10 NPS, is sometimes pain free. Currently just soreness. Still having difficulty with overhead ADL's like doing her hair, difficulty getting in and out her her tub, standing up from the floor. Reports no specific limitations or precautions to her R shoulder. 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   PAIN:  Are you having pain? Yes: NPRS scale: 1/10 Pain location: R shoulder Pain description: Dull/achey Aggravating factors: Overhead motion Relieving factors: rest  PRECAUTIONS: None  WEIGHT BEARING RESTRICTIONS: No  FALLS:  Has patient fallen in last 6 months? 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  PLOF: Independent  PATIENT GOALS: Improve her R shoulder AROM  NEXT MD VISIT: N/A   OBJECTIVE:   INTERVENTIONS THIS DATE: 01/18/23  TherEx:  Seated row at [facility] 35# 3x10  Seated lat pull down at [employer] 35# 3x10 Paloff press at [employer] 20# 2x10 each side  High row  10# KB 2x10 Y on wall with lift x 20 Ball throws at [employer], 2 KG, 2x10 Standing shoulder circles in forward flexion, CW/CCW, 30sec bouts x4 each direction Standing shoulder isometrics against wall into flexion, abduction, extension, and adduction, 3 sec holds, x10    PATIENT EDUCATION: Education details: POC, HEP Person educated: Patient Education method: Explanation Education comprehension: verbalized understanding  HOME EXERCISE PROGRAM: Plan for primary PT to update as needed.  ASSESSMENT:  CLINICAL IMPRESSION:  Pt is responding well to the treatment approach thus far and will continue to perform as necessary.  Pt would benefit from manual therapy in order to address the TP's noted to be found in the GHJ musculature.  Pt also planning on having a deep tissue myofascial massage next week in conjunction with treatment.  Will continue to monitor as pt progresses.   Pt will continue to benefit from skilled therapy to address remaining deficits in order to improve overall QoL and return to PLOF.      OBJECTIVE IMPAIRMENTS: decreased mobility, decreased ROM, impaired flexibility, impaired UE functional use, improper body mechanics, postural dysfunction, and pain.   ACTIVITY LIMITATIONS: carrying, lifting, sleeping, transfers, bed mobility, bathing, dressing, reach over head, and hygiene/grooming  PARTICIPATION LIMITATIONS: community activity, occupation, and yard work  PERSONAL FACTORS: Age, Past/current experiences, Profession, Time since onset of injury/illness/exacerbation, and 1 comorbidity: Hx of skin cancer  are also affecting patient's functional outcome.   REHAB POTENTIAL: Fair Chronicity of symptoms  CLINICAL DECISION MAKING: Evolving/moderate complexity  EVALUATION COMPLEXITY: Moderate   GOALS: Goals reviewed with patient? No   SHORT TERM GOALS: Target date: 11/28/22  Pt will be independent with HEP to improve R shoulder strength and  mobility to improve ability  to complete overhead ADL's Baseline: 10/31/22: Needs updated HEP Goal status: INITIAL   LONG TERM GOALS: Target date: 12/26/22  Pt will improve FOTO to target score to demonstrate clinically significant improvement in functional mobility.  Baseline: 10/31/22: 45 with target score of 66 12/25/22: 48 Goal status: IN PROGRESS  2.  Pt will decrease her QuickDash score by at least 10% to demonstrate clinically significant improvement in perceived use of RUE with ADL's and reduced disability. Baseline: 10/31/22: 47.7% 12/25/22: 40.91% Goal status: IN PROGRESS  3.  Pt will improve R shoulder flexion and abduction to at least 120 degrees to demonstrate ability to complete overhead ADL's like bathing and dressing. Baseline: 10/31/22: flex: 106 deg, abduction: 82 deg 12/25/22: Sitting: flex: 111 deg, abduction: 116 deg; Supine: flexion: 136 deg, abduction: 124 deg Goal status: IN PROGRESS  4.  Pt will improve functional strength and ROM of the R shoulder in order to fasten bra behind back. Baseline: Pt able to reach belt line with R shoulder. Goal status: INITIAL    PLAN:  PT FREQUENCY: 1-2x/week  PT DURATION: 8 weeks  PLANNED INTERVENTIONS: Therapeutic exercises, Therapeutic activity, Neuromuscular re-education, Patient/Family education, Self Care, Joint mobilization, Aquatic Therapy, Dry Needling, Electrical stimulation, Spinal manipulation, Spinal mobilization, Cryotherapy, Moist heat, Manual therapy, and Re-evaluation  PLAN FOR NEXT SESSION: potential dry needling R shoulder mobility and strengthening for stabilization    Nolon Bussing, PT, DPT Physical Therapist - [facility]  [facility]  01/18/23, 1:55 PM

## 2023-01-23 ENCOUNTER — Ambulatory Visit: Payer: PRIVATE HEALTH INSURANCE

## 2023-01-23 DIAGNOSIS — M25511 Pain in right shoulder: Secondary | ICD-10-CM

## 2023-01-23 DIAGNOSIS — M25641 Stiffness of right hand, not elsewhere classified: Secondary | ICD-10-CM

## 2023-01-23 DIAGNOSIS — M6281 Muscle weakness (generalized): Secondary | ICD-10-CM | POA: Diagnosis not present

## 2023-01-23 DIAGNOSIS — G8929 Other chronic pain: Secondary | ICD-10-CM

## 2023-01-23 NOTE — Therapy (Signed)
OUTPATIENT PHYSICAL THERAPY TREATMENT    Patient Name: Alice Taylor MRN: [id number] DOB:January 07, 1961, 62 y.o., female Today's Date: 01/23/2023  END OF SESSION:  PT End of Session - 01/23/23 1313     Visit Number 17    Number of Visits 19    Date for PT Re-Evaluation 01/25/23    Authorization Type [employer]    Authorization Time Period 10/31/22-12/26/22    Authorization - Visit Number 17    Authorization - Number of Visits 17    Progress Note Due on Visit 20    PT Start Time 1314    PT Stop Time 1345    PT Time Calculation (min) 31 min    Activity Tolerance Patient tolerated treatment well;No increased pain    Behavior During Therapy WFL for tasks assessed/performed               Past Medical History:  Diagnosis Date   Cancer (HCC) 1970   Skin   GERD (gastroesophageal reflux disease)    History of kidney stones    PONV (postoperative nausea and vomiting)    x1 after orif ankle surgery   Past Surgical History:  Procedure Laterality Date   APPENDECTOMY     KNEE ARTHROSCOPY Right    KNEE ARTHROSCOPY Left 04/24/2018   Procedure: ARTHROSCOPY KNEE, PARTIAL MEDIAL MENISCECTOMY;  Surgeon: Donato Heinz, MD;  Location: [facility];  Service: Orthopedics;  Laterality: Left;   ORIF ANKLE FRACTURE Right 01/17/2019   Procedure: OPEN REDUCTION INTERNAL FIXATION (ORIF) ANKLE FRACTURE;  Surgeon: Kennedy Bucker, MD;  Location: [facility];  Service: Orthopedics;  Laterality: Right;   ORIF WRIST FRACTURE Right 02/21/2022   Procedure: OPEN REDUCTION INTERNAL FIXATION (ORIF) OR RIGHT DISTAL RADIUS FRACTURE.;  Surgeon: Christena Flake, MD;  Location: [facility];  Service: Orthopedics;  Laterality: Right;   Patient Active Problem List   Diagnosis Date Noted   Closed right trimalleolar fracture, initial encounter 01/17/2019    PCP: Dolleschel, Denzil Hughes PROVIDER: Horris Latino   REFERRING DIAG: Fx Shoulder, Right  THERAPY DIAG:  Muscle weakness (generalized)  Acute pain of  right shoulder  Stiffness of right hand, not elsewhere classified  Chronic pain in right shoulder  Rationale for Evaluation and Treatment: Rehabilitation  ONSET DATE: Fx Shoulder, Right  SUBJECTIVE:                                                                                                                                                                                      SUBJECTIVE STATEMENT:  Pt reports she is doing well, but was hoeing the garden for 4 hours and notes some slight discomfort at this time.  Pt notes the discomfort is mostly just fatigue.   PERTINENT HISTORY: Pt is known to clinic returning due to R shoulder pain and limited AROM due to difficulty healing of R proximal humerus fracture. Pt reports she was unable to get her R shoulder manipulation as her MRI revealed her fracture has yet to heal. So per the orthopedic PA, the plan is to trial bone stimulator and additional PT and reassess after bone stimulation is complete. Worst pain 5/10 NPS, is sometimes pain free. Currently just soreness. Still having difficulty with overhead ADL's like doing her hair, difficulty getting in and out her her tub, standing up from the floor. Reports no specific limitations or precautions to her R shoulder. 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   PAIN:  Are you having pain? Yes: NPRS scale: 1/10 Pain location: R shoulder Pain description: Dull/achey Aggravating factors: Overhead motion Relieving factors: rest  PRECAUTIONS: None  WEIGHT BEARING RESTRICTIONS: No  FALLS:  Has patient fallen in last 6 months? 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  PLOF: Independent  PATIENT GOALS: Improve her R shoulder AROM  NEXT MD VISIT: N/A   OBJECTIVE:   INTERVENTIONS THIS DATE: 01/23/23  TherEx:  UBE level 4, 2.5 min forward/2.5 min  backward Seated row at [employer] 35# 3x10  Seated lat pull down at [employer] 35# 3x10 Paloff press at [employer] 20# 2x10 each side  High row 15# KB 2x10  Discussion of d/c options occurred and will continue to discuss as current POC expires following next visit.    PATIENT EDUCATION: Education details: POC, HEP Person educated: Patient Education method: Explanation Education comprehension: verbalized understanding  HOME EXERCISE PROGRAM: Plan for primary PT to update as needed.  ASSESSMENT:  CLINICAL IMPRESSION:  Pt session limited due to the pt arriving late.  Pt continues to demonstrate improved strength and mobility of the shoulder.  Pt is still having some discomfort with certain movements, but is overall improving.  Pt and therapist discussed d/c options during the next few weeks.  Pt is wanting to continue therapy until follow-up with MD possibly, or at end of the certification date.  Will discuss again with pt at next appointment.   Pt will continue to benefit from skilled therapy to address remaining deficits in order to improve overall QoL and return to PLOF.       OBJECTIVE IMPAIRMENTS: decreased mobility, decreased ROM, impaired flexibility, impaired UE functional use, improper body mechanics, postural dysfunction, and pain.   ACTIVITY LIMITATIONS: carrying, lifting, sleeping, transfers, bed mobility, bathing, dressing, reach over head, and hygiene/grooming  PARTICIPATION LIMITATIONS: community activity, occupation, and yard work  PERSONAL FACTORS: Age, Past/current experiences, Profession, Time since onset of injury/illness/exacerbation, and 1 comorbidity: Hx of skin cancer  are also affecting patient's functional outcome.   REHAB POTENTIAL: Fair Chronicity of symptoms  CLINICAL DECISION MAKING: Evolving/moderate complexity  EVALUATION COMPLEXITY: Moderate   GOALS: Goals reviewed with patient? No   SHORT TERM GOALS: Target date: 11/28/22  Pt will be independent with  HEP to improve R shoulder strength and  mobility to improve ability to complete overhead ADL's Baseline: 10/31/22: Needs updated HEP Goal status: INITIAL   LONG TERM GOALS: Target date: 12/26/22  Pt will improve FOTO to target score to demonstrate clinically significant improvement in functional mobility.  Baseline: 10/31/22: 45 with target score  of 66 12/25/22: 48 Goal status: IN PROGRESS  2.  Pt will decrease her QuickDash score by at least 10% to demonstrate clinically significant improvement in perceived use of RUE with ADL's and reduced disability. Baseline: 10/31/22: 47.7% 12/25/22: 40.91% Goal status: IN PROGRESS  3.  Pt will improve R shoulder flexion and abduction to at least 120 degrees to demonstrate ability to complete overhead ADL's like bathing and dressing. Baseline: 10/31/22: flex: 106 deg, abduction: 82 deg 12/25/22: Sitting: flex: 111 deg, abduction: 116 deg; Supine: flexion: 136 deg, abduction: 124 deg Goal status: IN PROGRESS  4.  Pt will improve functional strength and ROM of the R shoulder in order to fasten bra behind back. Baseline: Pt able to reach belt line with R shoulder. Goal status: INITIAL    PLAN:  PT FREQUENCY: 1-2x/week  PT DURATION: 8 weeks  PLANNED INTERVENTIONS: Therapeutic exercises, Therapeutic activity, Neuromuscular re-education, Patient/Family education, Self Care, Joint mobilization, Aquatic Therapy, Dry Needling, Electrical stimulation, Spinal manipulation, Spinal mobilization, Cryotherapy, Moist heat, Manual therapy, and Re-evaluation  PLAN FOR NEXT SESSION: dry needling R shoulder mobility and strengthening for stabilization    Nolon Bussing, PT, DPT Physical Therapist - [facility]  [facility]  01/23/23, 3:04 PM

## 2023-01-25 ENCOUNTER — Ambulatory Visit: Payer: PRIVATE HEALTH INSURANCE

## 2023-01-25 DIAGNOSIS — M25511 Pain in right shoulder: Secondary | ICD-10-CM

## 2023-01-25 DIAGNOSIS — M6281 Muscle weakness (generalized): Secondary | ICD-10-CM | POA: Diagnosis not present

## 2023-01-25 NOTE — Therapy (Signed)
OUTPATIENT PHYSICAL THERAPY TREATMENT    Patient Name: Alice Taylor MRN: [id number] DOB:[birth year], 62 y.o., female Today's Date: 01/25/2023  END OF SESSION:  PT End of Session - 01/25/23 1352     Visit Number 18    Number of Visits 19    Date for PT Re-Evaluation 01/25/23    Authorization Type [employer]    Authorization Time Period 10/31/22-12/26/22    Authorization - Visit Number 18    Authorization - Number of Visits 18    Progress Note Due on Visit 20    PT Start Time 1352    PT Stop Time 1430    PT Time Calculation (min) 38 min    Activity Tolerance Patient tolerated treatment well;No increased pain    Behavior During Therapy WFL for tasks assessed/performed               Past Medical History:  Diagnosis Date   Cancer (HCC) 1970   Skin   GERD (gastroesophageal reflux disease)    History of kidney stones    PONV (postoperative nausea and vomiting)    x1 after orif ankle surgery   Past Surgical History:  Procedure Laterality Date   APPENDECTOMY     KNEE ARTHROSCOPY Right    KNEE ARTHROSCOPY Left 04/24/2018   Procedure: ARTHROSCOPY KNEE, PARTIAL MEDIAL MENISCECTOMY;  Surgeon: Donato Heinz, MD;  Location: [facility];  Service: Orthopedics;  Laterality: Left;   ORIF ANKLE FRACTURE Right 01/17/2019   Procedure: OPEN REDUCTION INTERNAL FIXATION (ORIF) ANKLE FRACTURE;  Surgeon: Kennedy Bucker, MD;  Location: [facility];  Service: Orthopedics;  Laterality: Right;   ORIF WRIST FRACTURE Right 02/21/2022   Procedure: OPEN REDUCTION INTERNAL FIXATION (ORIF) OR RIGHT DISTAL RADIUS FRACTURE.;  Surgeon: Christena Flake, MD;  Location: [facility];  Service: Orthopedics;  Laterality: Right;   Patient Active Problem List   Diagnosis Date Noted   Closed right trimalleolar fracture, initial encounter 01/17/2019    PCP: Dolleschel, Denzil Hughes PROVIDER: Horris Latino   REFERRING DIAG: Fx Shoulder, Right  THERAPY DIAG:  Muscle weakness (generalized)  Acute pain of  right shoulder  Rationale for Evaluation and Treatment: Rehabilitation  ONSET DATE: Fx Shoulder, Right  SUBJECTIVE:                                                                                                                                                                                      SUBJECTIVE STATEMENT:  Pt reports that she is doing well.  No pain reported upon arrival.   PERTINENT HISTORY: Pt is known to clinic returning due to R shoulder pain and limited AROM due to difficulty healing of  R proximal humerus fracture. Pt reports she was unable to get her R shoulder manipulation as her MRI revealed her fracture has yet to heal. So per the orthopedic PA, the plan is to trial bone stimulator and additional PT and reassess after bone stimulation is complete. Worst pain 5/10 NPS, is sometimes pain free. Currently just soreness. Still having difficulty with overhead ADL's like doing her hair, difficulty getting in and out her her tub, standing up from the floor. Reports no specific limitations or precautions to her R shoulder. 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   PAIN:  Are you having pain? Yes: NPRS scale: 0/10 Pain location: R shoulder Pain description: Dull/achey Aggravating factors: Overhead motion Relieving factors: rest  PRECAUTIONS: None  WEIGHT BEARING RESTRICTIONS: No  FALLS:  Has patient fallen in last 6 months? 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  PLOF: Independent  PATIENT GOALS: Improve her R shoulder AROM  NEXT MD VISIT: N/A   OBJECTIVE:   INTERVENTIONS THIS DATE: 01/25/23  TherEx:  UBE level 4, 2.5 min forward/2.5 min backward  Seated row at OMEGA 35# 3x10  Seated lat pull down at OMEGA 35# 3x10 Paloff press at OMEGA 20# 2x10 each side  High row 15# KB 2x10 Standing body blade into forward  flexion, overhead flexion, and behind back with BUE's, 30 sec bouts Wall push-ups while placing hands on BOSU ball for increased difficulty, x10 TRX push ups from incline, x10 TRX pull-ups from incline, x10 Standing shoulder abduction with 5# blue weighted ball w/strap, 2x10     PATIENT EDUCATION: Education details: POC, HEP Person educated: Patient Education method: Explanation Education comprehension: verbalized understanding  HOME EXERCISE PROGRAM: Plan for primary PT to update as needed.  ASSESSMENT:  CLINICAL IMPRESSION:  Pt continues to perform well with exercises and is able to tolerate increased reps and resistance of exercises while also being introduced to new exercises.  Pt reports no increase in pain, just weakness of the R shoulder at this time.  Pt encouraged to continue to improve with doing activities at home that would be challenging.  Pt to be assessed at next visit for goals and extending plan until the end of the month.   Pt will continue to benefit from skilled therapy to address remaining deficits in order to improve overall QoL and return to PLOF.        OBJECTIVE IMPAIRMENTS: decreased mobility, decreased ROM, impaired flexibility, impaired UE functional use, improper body mechanics, postural dysfunction, and pain.   ACTIVITY LIMITATIONS: carrying, lifting, sleeping, transfers, bed mobility, bathing, dressing, reach over head, and hygiene/grooming  PARTICIPATION LIMITATIONS: community activity, occupation, and yard work  PERSONAL FACTORS: Age, Past/current experiences, Profession, Time since onset of injury/illness/exacerbation, and 1 comorbidity: Hx of skin cancer  are also affecting patient's functional outcome.   REHAB POTENTIAL: Fair Chronicity of symptoms  CLINICAL DECISION MAKING: Evolving/moderate complexity  EVALUATION COMPLEXITY: Moderate   GOALS: Goals reviewed with patient? No   SHORT TERM GOALS: Target date: 11/28/22  Pt will be  independent with HEP to improve R shoulder strength and  mobility to improve ability to complete overhead ADL's Baseline: 10/31/22: Needs updated HEP Goal status: INITIAL   LONG TERM GOALS: Target date: 12/26/22  Pt will improve FOTO to target score to demonstrate clinically significant improvement in functional mobility.  Baseline: 10/31/22:  45 with target score of 66 12/25/22: 48 Goal status: IN PROGRESS  2.  Pt will decrease her QuickDash score by at least 10% to demonstrate clinically significant improvement in perceived use of RUE with ADL's and reduced disability. Baseline: 10/31/22: 47.7% 12/25/22: 40.91% Goal status: IN PROGRESS  3.  Pt will improve R shoulder flexion and abduction to at least 120 degrees to demonstrate ability to complete overhead ADL's like bathing and dressing. Baseline: 10/31/22: flex: 106 deg, abduction: 82 deg 12/25/22: Sitting: flex: 111 deg, abduction: 116 deg; Supine: flexion: 136 deg, abduction: 124 deg Goal status: IN PROGRESS  4.  Pt will improve functional strength and ROM of the R shoulder in order to fasten bra behind back. Baseline: Pt able to reach belt line with R shoulder. Goal status: INITIAL    PLAN:  PT FREQUENCY: 1-2x/week  PT DURATION: 8 weeks  PLANNED INTERVENTIONS: Therapeutic exercises, Therapeutic activity, Neuromuscular re-education, Patient/Family education, Self Care, Joint mobilization, Aquatic Therapy, Dry Needling, Electrical stimulation, Spinal manipulation, Spinal mobilization, Cryotherapy, Moist heat, Manual therapy, and Re-evaluation  PLAN FOR NEXT SESSION: dry needling R shoulder mobility and strengthening for stabilization    Nolon Bussing, PT, DPT Physical Therapist - [facility]  [facility]  01/25/23, 2:59 PM

## 2023-01-30 ENCOUNTER — Ambulatory Visit: Payer: PRIVATE HEALTH INSURANCE

## 2023-01-30 DIAGNOSIS — M6281 Muscle weakness (generalized): Secondary | ICD-10-CM

## 2023-01-30 DIAGNOSIS — M25511 Pain in right shoulder: Secondary | ICD-10-CM

## 2023-01-30 NOTE — Therapy (Signed)
OUTPATIENT PHYSICAL THERAPY TREATMENT/RE-CERTIFICATION/PHYSICAL THERAPY PROGRESS NOTE   Dates of reporting period  12/25/22   to   01/30/23     Patient Name: Alice Taylor MRN: [id number] DOB:16-Mar-1961, 62 y.o., female Today's Date: 01/30/2023  END OF SESSION:  PT End of Session - 01/30/23 1351     Visit Number 19    Number of Visits 27    Date for PT Re-Evaluation 02/27/23    Authorization Type [employer]    Authorization Time Period 10/31/22-12/26/22    Authorization - Visit Number 19    Authorization - Number of Visits 27    Progress Note Due on Visit 20    PT Start Time 1350    PT Stop Time 1430    PT Time Calculation (min) 40 min    Activity Tolerance Patient tolerated treatment well;No increased pain    Behavior During Therapy WFL for tasks assessed/performed               Past Medical History:  Diagnosis Date   Cancer (HCC) 1970   Skin   GERD (gastroesophageal reflux disease)    History of kidney stones    PONV (postoperative nausea and vomiting)    x1 after orif ankle surgery   Past Surgical History:  Procedure Laterality Date   APPENDECTOMY     KNEE ARTHROSCOPY Right    KNEE ARTHROSCOPY Left 04/24/2018   Procedure: ARTHROSCOPY KNEE, PARTIAL MEDIAL MENISCECTOMY;  Surgeon: Donato Heinz, MD;  Location: [facility];  Service: Orthopedics;  Laterality: Left;   ORIF ANKLE FRACTURE Right 01/17/2019   Procedure: OPEN REDUCTION INTERNAL FIXATION (ORIF) ANKLE FRACTURE;  Surgeon: Kennedy Bucker, MD;  Location: [facility];  Service: Orthopedics;  Laterality: Right;   ORIF WRIST FRACTURE Right 02/21/2022   Procedure: OPEN REDUCTION INTERNAL FIXATION (ORIF) OR RIGHT DISTAL RADIUS FRACTURE.;  Surgeon: Christena Flake, MD;  Location: [facility];  Service: Orthopedics;  Laterality: Right;   Patient Active Problem List   Diagnosis Date Noted   Closed right trimalleolar fracture, initial encounter 01/17/2019    PCP: Dolleschel, Denzil Hughes PROVIDER: Horris Latino    REFERRING DIAG: Fx Shoulder, Right  THERAPY DIAG:  Muscle weakness (generalized)  Acute pain of right shoulder  Rationale for Evaluation and Treatment: Rehabilitation  ONSET DATE: Fx Shoulder, Right  SUBJECTIVE:                                                                                                                                                                                      SUBJECTIVE STATEMENT:  Pt reports she had a good time over the weekend with attending two weddings within the family.  Pt notes that when dancing she was able to do a "lasso throw" dance move and it did not cause pain which was new for her.     PERTINENT HISTORY: Pt is known to clinic returning due to R shoulder pain and limited AROM due to difficulty healing of R proximal humerus fracture. Pt reports she was unable to get her R shoulder manipulation as her MRI revealed her fracture has yet to heal. So per the orthopedic PA, the plan is to trial bone stimulator and additional PT and reassess after bone stimulation is complete. Worst pain 5/10 NPS, is sometimes pain free. Currently just soreness. Still having difficulty with overhead ADL's like doing her hair, difficulty getting in and out her her tub, standing up from the floor. Reports no specific limitations or precautions to her R shoulder. 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   PAIN:  Are you having pain? Yes: NPRS scale: 0/10 Pain location: R shoulder Pain description: Dull/achey Aggravating factors: Overhead motion Relieving factors: rest  PRECAUTIONS: None  WEIGHT BEARING RESTRICTIONS: No  FALLS:  Has patient fallen in last 6 months? 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  PLOF: Independent  PATIENT GOALS: Improve her R shoulder AROM  NEXT MD VISIT: N/A   OBJECTIVE:    INTERVENTIONS THIS DATE: 01/30/23  Manual:  Supine inferior glides to R shoulder, grades II-III for pain modulation and improved ROM, with significant restrictions in glides Supine AP glides to R shoulder, grades II-III for pain modulation and improved ROM, less restrictions compared to inferior glides, however still has tissue restrictions  Goal assessment performed and noted below     PATIENT EDUCATION: Education details: POC, HEP Person educated: Patient Education method: Explanation Education comprehension: verbalized understanding  HOME EXERCISE PROGRAM: Plan for primary PT to update as needed.  ASSESSMENT:  CLINICAL IMPRESSION:  Pt continues to make steady improvements in shoulder at this point in time.  Pt has made good progress towards the goals as noted below.  Pt has improved in outcome measures, however is still lacking in ROM for more functional activities such as fastening a bra.  Pt also continues to have more difficulty and less ROM in a gravity dependent position, which signifies a weakness in the musculature surrounding the shoulder joint.  Patient's condition has the potential to improve in response to therapy. Maximum improvement is yet to be obtained. The anticipated improvement is attainable and reasonable in a generally predictable time.  Pt will continue to benefit from skilled therapy to address remaining deficits in order to improve overall QoL and return to PLOF.         OBJECTIVE IMPAIRMENTS: decreased mobility, decreased ROM, impaired flexibility, impaired UE functional use, improper body mechanics, postural dysfunction, and pain.   ACTIVITY LIMITATIONS: carrying, lifting, sleeping, transfers, bed mobility, bathing, dressing, reach over head, and hygiene/grooming  PARTICIPATION LIMITATIONS: community activity, occupation, and yard work  PERSONAL FACTORS: Age, Past/current experiences, Profession, Time since onset of injury/illness/exacerbation,  and 1 comorbidity: Hx of skin cancer  are also affecting patient's functional outcome.   REHAB POTENTIAL: Fair Chronicity of symptoms  CLINICAL DECISION MAKING: Evolving/moderate complexity  EVALUATION COMPLEXITY: Moderate   GOALS: Goals reviewed with patient? No   SHORT TERM GOALS: Target date: 11/28/22  Pt will be independent with HEP to improve R shoulder strength and  mobility to improve ability to complete overhead ADL's Baseline: 10/31/22: Needs updated HEP Goal status: INITIAL   LONG TERM GOALS: Target date: 12/26/22  Pt will improve FOTO to target score to demonstrate clinically significant improvement in functional mobility.  Baseline: 10/31/22: 45 with target score of 66 12/25/22: 48 01/30/23: 54 Goal status: IN PROGRESS  2.  Pt will decrease her QuickDash score by at least 10% to demonstrate clinically significant improvement in perceived use of RUE with ADL's and reduced disability. Baseline: 10/31/22: 47.7% 12/25/22: 40.91% 01/30/23: 31.8% Goal status: IN PROGRESS  3.  Pt will improve R shoulder flexion and abduction to at least 120 degrees to demonstrate ability to complete overhead ADL's like bathing and dressing. Baseline: 10/31/22: flex: 106 deg, abduction: 82 deg 12/25/22: Sitting: flex: 111 deg, abduction: 116 deg; Supine: flexion: 136 deg, abduction: 124 deg 01/30/23: Sitting: flex: 114 deg; abduction: 119 deg; Supine: flexion: 145 deg; abduction: 143 deg Goal status: IN PROGRESS  4.  Pt will improve functional strength and ROM of the R shoulder in order to fasten bra behind back. Baseline: Pt able to reach belt line with R shoulder. 01/30/23:  Pt able to reach slightly above belt line with R shoulder Goal status: IN PROGRESS    PLAN:  PT FREQUENCY: 1-2x/week  PT DURATION: 8 weeks  PLANNED INTERVENTIONS: Therapeutic exercises, Therapeutic activity, Neuromuscular re-education, Patient/Family education, Self Care, Joint mobilization, Aquatic Therapy, Dry  Needling, Electrical stimulation, Spinal manipulation, Spinal mobilization, Cryotherapy, Moist heat, Manual therapy, and Re-evaluation  PLAN FOR NEXT SESSION: dry needling R shoulder mobility and strengthening for stabilization    Nolon Bussing, PT, DPT Physical Therapist - [facility]  [facility]  01/30/23, 2:58 PM

## 2023-02-01 ENCOUNTER — Ambulatory Visit: Payer: PRIVATE HEALTH INSURANCE

## 2023-02-06 ENCOUNTER — Ambulatory Visit: Payer: PRIVATE HEALTH INSURANCE

## 2023-02-06 DIAGNOSIS — M6281 Muscle weakness (generalized): Secondary | ICD-10-CM

## 2023-02-06 DIAGNOSIS — M25511 Pain in right shoulder: Secondary | ICD-10-CM

## 2023-02-06 NOTE — Therapy (Signed)
OUTPATIENT PHYSICAL THERAPY TREATMENT/PHYSICAL THERAPY PROGRESS NOTE   Dates of reporting period  12/28/22   to   02/06/23     Patient Name: Alice Taylor MRN: [id number] DOB:1961-04-03, 62 y.o., female Today's Date: 02/06/2023  END OF SESSION:  PT End of Session - 02/06/23 1437     Visit Number 20    Number of Visits 27    Date for PT Re-Evaluation 02/27/23    Authorization Type [employer]    Authorization Time Period 10/31/22-12/26/22    Authorization - Visit Number 20    Authorization - Number of Visits 27    Progress Note Due on Visit 20    PT Start Time 1435    PT Stop Time 1520    PT Time Calculation (min) 45 min    Activity Tolerance Patient tolerated treatment well;No increased pain    Behavior During Therapy WFL for tasks assessed/performed               Past Medical History:  Diagnosis Date   Cancer (HCC) 1970   Skin   GERD (gastroesophageal reflux disease)    History of kidney stones    PONV (postoperative nausea and vomiting)    x1 after orif ankle surgery   Past Surgical History:  Procedure Laterality Date   APPENDECTOMY     KNEE ARTHROSCOPY Right    KNEE ARTHROSCOPY Left 04/24/2018   Procedure: ARTHROSCOPY KNEE, PARTIAL MEDIAL MENISCECTOMY;  Surgeon: Donato Heinz, MD;  Location: [facility];  Service: Orthopedics;  Laterality: Left;   ORIF ANKLE FRACTURE Right 01/17/2019   Procedure: OPEN REDUCTION INTERNAL FIXATION (ORIF) ANKLE FRACTURE;  Surgeon: Kennedy Bucker, MD;  Location: [facility];  Service: Orthopedics;  Laterality: Right;   ORIF WRIST FRACTURE Right 02/21/2022   Procedure: OPEN REDUCTION INTERNAL FIXATION (ORIF) OR RIGHT DISTAL RADIUS FRACTURE.;  Surgeon: Christena Flake, MD;  Location: [facility];  Service: Orthopedics;  Laterality: Right;   Patient Active Problem List   Diagnosis Date Noted   Closed right trimalleolar fracture, initial encounter 01/17/2019    PCP: Dolleschel, Denzil Hughes PROVIDER: Horris Latino   REFERRING DIAG:  Fx Shoulder, Right  THERAPY DIAG:  Muscle weakness (generalized)  Acute pain of right shoulder  Rationale for Evaluation and Treatment: Rehabilitation  ONSET DATE: Fx Shoulder, Right  SUBJECTIVE:                                                                                                                                                                                      SUBJECTIVE STATEMENT:  Pt reports she had a good time over the weekend with attending two weddings within the family.  Pt notes that when dancing she was able to do a "lasso throw" dance move and it did not cause pain which was new for her.     PERTINENT HISTORY: Pt is known to clinic returning due to R shoulder pain and limited AROM due to difficulty healing of R proximal humerus fracture. Pt reports she was unable to get her R shoulder manipulation as her MRI revealed her fracture has yet to heal. So per the orthopedic PA, the plan is to trial bone stimulator and additional PT and reassess after bone stimulation is complete. Worst pain 5/10 NPS, is sometimes pain free. Currently just soreness. Still having difficulty with overhead ADL's like doing her hair, difficulty getting in and out her her tub, standing up from the floor. Reports no specific limitations or precautions to her R shoulder. 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   PAIN:  Are you having pain? Yes: NPRS scale: 0/10 Pain location: R shoulder Pain description: Dull/achey Aggravating factors: Overhead motion Relieving factors: rest  PRECAUTIONS: None  WEIGHT BEARING RESTRICTIONS: No  FALLS:  Has patient fallen in last 6 months? 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  PLOF: Independent  PATIENT GOALS: Improve her R shoulder AROM  NEXT MD VISIT: N/A   OBJECTIVE:   INTERVENTIONS THIS DATE:  02/06/23  Manual:  Supine inferior glides to R shoulder, grades II-III for pain modulation and improved ROM, with significant restrictions in glides Supine AP glides to R shoulder, grades II-III for pain modulation and improved ROM, less restrictions compared to inferior glides, however still has tissue restrictions Supine STM with TP release technique applied to the subscapularis and biceps region Supine STM with TP release technique applied to the UT of the R shoulder as well   Trigger Point Dry-Needling  Treatment instructions: Expect mild to moderate muscle soreness. S/S of pneumothorax if dry needled over a lung field, and to seek immediate medical attention should they occur. Patient verbalized understanding of these instructions and education.  Patient Consent Given: Yes Education handout provided: No Muscles treated: R Subscapularis and R UT Electrical stimulation performed: No Parameters: N/A Treatment response/outcome: Pt with significant muscle twitch of the R subscapular and R UT.        PATIENT EDUCATION: Education details: POC, HEP Person educated: Patient Education method: Explanation Education comprehension: verbalized understanding  HOME EXERCISE PROGRAM: Plan for primary PT to update as needed.  ASSESSMENT:  CLINICAL IMPRESSION:  Pt continues to perform well and demonstrates improved ROM with manual therapy and dry needling technique.  Pt had significant muscle twitch with subscapularis release of the R UE.  Pt also had one release of the UT of the R side as well.  Pt does have considerable TP's noted in the subscapular region as well, but was not dry needled during treatment today.   Pt will continue to benefit from skilled therapy to address remaining deficits in order to improve overall QoL and return to PLOF.          OBJECTIVE IMPAIRMENTS: decreased mobility, decreased ROM, impaired flexibility, impaired UE functional use, improper body mechanics,  postural dysfunction, and pain.   ACTIVITY LIMITATIONS: carrying, lifting, sleeping, transfers, bed mobility, bathing, dressing, reach over head, and hygiene/grooming  PARTICIPATION LIMITATIONS: community activity, occupation, and yard work  PERSONAL FACTORS: Age, Past/current experiences, Profession, Time since  onset of injury/illness/exacerbation, and 1 comorbidity: Hx of skin cancer  are also affecting patient's functional outcome.   REHAB POTENTIAL: Fair Chronicity of symptoms  CLINICAL DECISION MAKING: Evolving/moderate complexity  EVALUATION COMPLEXITY: Moderate   GOALS: Goals reviewed with patient? No   SHORT TERM GOALS: Target date: 11/28/22  Pt will be independent with HEP to improve R shoulder strength and  mobility to improve ability to complete overhead ADL's Baseline: 10/31/22: Needs updated HEP Goal status: INITIAL   LONG TERM GOALS: Target date: 12/26/22  Pt will improve FOTO to target score to demonstrate clinically significant improvement in functional mobility.  Baseline: 10/31/22: 45 with target score of 66 12/25/22: 48 01/30/23: 54 Goal status: IN PROGRESS  2.  Pt will decrease her QuickDash score by at least 10% to demonstrate clinically significant improvement in perceived use of RUE with ADL's and reduced disability. Baseline: 10/31/22: 47.7% 12/25/22: 40.91% 01/30/23: 31.8% Goal status: IN PROGRESS  3.  Pt will improve R shoulder flexion and abduction to at least 120 degrees to demonstrate ability to complete overhead ADL's like bathing and dressing. Baseline: 10/31/22: flex: 106 deg, abduction: 82 deg 12/25/22: Sitting: flex: 111 deg, abduction: 116 deg; Supine: flexion: 136 deg, abduction: 124 deg 01/30/23: Sitting: flex: 114 deg; abduction: 119 deg; Supine: flexion: 145 deg; abduction: 143 deg Goal status: IN PROGRESS  4.  Pt will improve functional strength and ROM of the R shoulder in order to fasten bra behind back. Baseline: Pt able to reach belt line  with R shoulder. 01/30/23:  Pt able to reach slightly above belt line with R shoulder Goal status: IN PROGRESS    PLAN:  PT FREQUENCY: 1-2x/week  PT DURATION: 8 weeks  PLANNED INTERVENTIONS: Therapeutic exercises, Therapeutic activity, Neuromuscular re-education, Patient/Family education, Self Care, Joint mobilization, Aquatic Therapy, Dry Needling, Electrical stimulation, Spinal manipulation, Spinal mobilization, Cryotherapy, Moist heat, Manual therapy, and Re-evaluation  PLAN FOR NEXT SESSION: dry needling R shoulder mobility and strengthening for stabilization    Nolon Bussing, PT, DPT Physical Therapist - [facility]  [facility]  02/06/23, 4:40 PM

## 2023-02-08 ENCOUNTER — Ambulatory Visit: Payer: PRIVATE HEALTH INSURANCE

## 2023-02-22 ENCOUNTER — Ambulatory Visit: Payer: PRIVATE HEALTH INSURANCE | Admitting: Occupational Therapy

## 2023-02-26 ENCOUNTER — Ambulatory Visit: Payer: PRIVATE HEALTH INSURANCE | Attending: Student | Admitting: Occupational Therapy

## 2023-02-26 DIAGNOSIS — M6281 Muscle weakness (generalized): Secondary | ICD-10-CM | POA: Insufficient documentation

## 2023-02-26 DIAGNOSIS — M25631 Stiffness of right wrist, not elsewhere classified: Secondary | ICD-10-CM | POA: Diagnosis present

## 2023-02-26 DIAGNOSIS — M25641 Stiffness of right hand, not elsewhere classified: Secondary | ICD-10-CM | POA: Diagnosis present

## 2023-02-26 NOTE — Therapy (Signed)
[facility] [address], [place], [address] Phone: [phone]   Fax:  [phone]  Occupational Therapy Treatment  Patient Details  Name: Alice Taylor MRN: [id number] Date of Birth: 27-Oct-1960 Referring Provider (OT): [place] PA   Encounter Date: 02/26/2023   OT End of Session - 02/26/23 1232     Visit Number 17    Number of Visits 20    Date for OT Re-Evaluation 04/30/23    OT Start Time 1130    OT Stop Time 1205    OT Time Calculation (min) 35 min    Activity Tolerance Patient tolerated treatment well    Behavior During Therapy [facility] for tasks assessed/performed             Past Medical History:  Diagnosis Date   Cancer (HCC) 1970   Skin   GERD (gastroesophageal reflux disease)    History of kidney stones    PONV (postoperative nausea and vomiting)    x1 after orif ankle surgery    Past Surgical History:  Procedure Laterality Date   APPENDECTOMY     KNEE ARTHROSCOPY Right    KNEE ARTHROSCOPY Left 04/24/2018   Procedure: ARTHROSCOPY KNEE, PARTIAL MEDIAL MENISCECTOMY;  Surgeon: Donato Heinz, MD;  Location: [facility] ORS;  Service: Orthopedics;  Laterality: Left;   ORIF ANKLE FRACTURE Right 01/17/2019   Procedure: OPEN REDUCTION INTERNAL FIXATION (ORIF) ANKLE FRACTURE;  Surgeon: Kennedy Bucker, MD;  Location: [facility] ORS;  Service: Orthopedics;  Laterality: Right;   ORIF WRIST FRACTURE Right 02/21/2022   Procedure: OPEN REDUCTION INTERNAL FIXATION (ORIF) OR RIGHT DISTAL RADIUS FRACTURE.;  Surgeon: Christena Flake, MD;  Location: [facility] ORS;  Service: Orthopedics;  Laterality: Right;    There were no vitals filed for this visit.   Subjective Assessment - 02/26/23 1230     Subjective  I feel like since the shoulder fracture my hand and wrist still gets stiff, pinkie pins and needles at times- dropping things, not good grip on wide objects, writing and picking up small objects still hard    Pertinent History Pt last  seen by PA at Ortho on 04/03/22 - Alice Taylor is a 62 y.o. female who presents for follow-up now 6 weeks status post an open reduction and internal fixation of her right distal radius fracture with an ulnar styloid fracture. Overall, the patient feels that she is doing well. She still notes some discomfort in her wrist which she rates at 4/10 on today's visit, and for which she has been taking Tylenol and applying ice as necessary with temporary partial relief of her symptoms. She been wearing her Velcro wrist immobilizer on a regular basis, removing it for bathing purposes and for exercises. She still notes moderate stiffness in the wrist and fingers, as well as some swelling, but denies any reinjury to the hand or wrist. She also denies any fevers or chills, and denies any numbness or paresthesias to her fingers.Xray showed good healing at radius and ulnar and refer to therapy - pt has been doing PT at [facility] 3 x wk per pt the last month - pt refer to OT for digits stiffness and possible splint. 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  As well as painting and being able to decorate at different events.    Currently in Pain? No/denies                [facility] OT Assessment - 02/26/23 0001       AROM   Right Forearm Pronation 90 Degrees    Right Forearm Supination 85 Degrees    Right Wrist Extension 70 Degrees    Right Wrist Flexion 85 Degrees    Right Wrist Radial Deviation 20 Degrees    Right Wrist Ulnar Deviation 30 Degrees      Strength   Right Hand Grip (lbs) 36    Right Hand Lateral Pinch 15 lbs    Right Hand 3 Point Pinch 13 lbs    Left Hand Grip (lbs) 54    Left Hand Lateral Pinch 16  lbs    Left Hand 3 Point Pinch 12 lbs      Right Hand AROM   R Little  MCP 0-90 90 Degrees    R Little PIP 0-100 100 Degrees   -20 ext   R Little DIP 0-70 60 Degrees              Patient arrived after not being seen for 3 months.  Patient was receiving PT for her right shoulder fracture as well as a bone stimulator. Patient returned with reports of continues increased stiffness at the right wrist and hand at times.  Including the fifth digit.  As well as increased swelling. Feels like decreased strength in the right hand with picking up especially wider objects and dropping them at times. Also hard time with fine motor like picking up small objects and doing buttons or shoelaces. 3-5 times a week patient continues to use paraffin bath with a LMB extension splint on the fifth PIP. Also using the cast as needed on the fifth PIP nighttime about 3 times a week.  Patient is active range of motion compared to 3 months ago did decrease in wrist extension and PIP extension of the fifth. Range and strength within functional limits Grip strength decreased from 30 pounds to 36 and left is 54 pounds Fine motor coordination with simulated 5 object  fingers<>pam  17 sec R and L 10 sec Wrist and forearm strength increase greatly but wrist flexion extension still decreased to 4+/5 Patient provided and borrowed a flex bar yellow to work on wrist flexion and extension as well as supination 2 sets of 12 2 times a day pain-free At firm dark blue putty to her green-changing gripping more towards pulling with the ulnar 3 fingers of right hand 2 sets of 12 Fine motor coordination patient to work and compare left and right with 5 objects picking up with 2-point pinch from fingertips to palm. 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 -and seeing flexor tightness- LMB splint , cast on 5th PIP  And table slides for composite extension Follow up in 3 wks                OT Education - 02/26/23 1232  Education Details progress for wrist and hand, HEP changes and use of LMB splint 5th PIP; and cast at nighttime    Person(s) Educated Patient    Methods Explanation;Demonstration;Tactile cues;Verbal cues;Handout    Comprehension Verbal cues required;Returned demonstration;Verbalized understanding              OT Short Term Goals - 02/26/23 1430       OT SHORT TERM GOAL #2   Title FMC in R hand improve with at least 3 sec to report increase ease with writing , do fasteners    Baseline Simulated 5 objects palm <> fingers tips - R hand 17 sec and L 10 sec- report hand time picking up small objects or doing fasteners including shoelaces    Time 6    Period Weeks    Status New    Target Date 04/09/23               OT Long Term Goals - 02/26/23 1428       Long Term Additional Goals   Additional Long Term Goals Yes      OT LONG TERM GOAL #6   Title R grip and wrist strength increase for pt to pick up and carry 8lbs with more ease with hand and wrist without increase symptoms    Baseline Grip R 36, L 54 lbs -and dropping objects- increase swelling, stiffness in R hand- harder time with objects that is wider    Time 9    Period Weeks    Status New    Target Date 04/30/23                   Plan - 02/26/23 1233     Clinical Impression Statement Patient was seen by this OT after her diagnosis of postop Colles' fracture R distal radius  ORIF on 02/21/22.  Pt made great progress at that time in about 6 visits but then had had another fall early Oct that resulted in a proximal humerus nondisplaced fracture.  SHe was in sling for while and  started working with PT for shoulder - and return to OT with increase stiffness in  5th digit PIP . Pt did in the past show some Cubital tunnel symptoms- tenderness and + Tinel.  Made  progress in flexor contracture at 5th to -15 and flexion WNL - grip increased 3 months ago to 30 lbs. Pt arrive today after using bone stimulator on shoulder and having PT - with reports of incrase stiffness , weakness and feeling of swelling in R hand and wrist at times- dropping objects, wider grip more trouble with , writing and FMC picking up small objects. Using her Paraffin and LMB splint /cast on 5th digit about 3-5 x wk since 3 months ago. Pt show decrease wrist extention , grip increase but still decrease 36 R , L 54 lbs. Decrease wrist ext and flexion strength to 4+/5 and FMC decrease with simulated 5 objects palm <> fingers R 17 cm and L 10 sec. Upgraded pt HEP with putty , flexbar for wrist and FMC - pt to do 3 wks at home and follow up.   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 Pt can benefit from skilled OT services to increase ROM and strength as well as FMC to be more Independent in ADL's and IADL's.    OT Occupational Profile and History Problem Focused Assessment -  Including review of records relating to presenting problem    Occupational performance deficits (Please refer to evaluation for details): ADL's;IADL's;Work;Play;Leisure;Social Participation    Body Structure / Function / Physical Skills ADL;Strength;Dexterity;Pain;Edema;UE functional use;IADL;ROM;Scar mobility;Flexibility    Rehab Potential Good    Clinical Decision Making Limited treatment options, no task modification necessary    Comorbidities Affecting Occupational Performance: None    Modification or Assistance to Complete Evaluation  No modification of tasks or assist necessary to complete eval    OT Frequency --   every 3 wks   OT Duration --   9 wks   OT Treatment/Interventions Self-care/ADL training;Fluidtherapy;Moist Heat;Contrast Bath;Paraffin;Manual  Therapy;Patient/family education;Passive range of motion;Scar mobilization;Therapeutic exercise;Splinting;DME and/or AE instruction    Consulted and Agree with Plan of Care Patient             Patient will benefit from skilled therapeutic intervention in order to improve the following deficits and impairments:   Body Structure / Function / Physical Skills: ADL, Strength, Dexterity, Pain, Edema, UE functional use, IADL, ROM, Scar mobility, Flexibility       Visit Diagnosis: Muscle weakness (generalized)  Stiffness of right hand, not elsewhere classified  Stiffness of right wrist, not elsewhere classified    Problem List Patient Active Problem List   Diagnosis Date Noted   Closed right trimalleolar fracture, initial encounter 01/17/2019    Oletta Cohn, OTR/L,CLT 02/26/2023, 2:32 PM  [facility] [address], [place], [address] Phone: [phone]   Fax:  [phone]  Name: Alice Taylor MRN: [id number] Date of Birth: Feb 20, 1961

## 2023-03-19 ENCOUNTER — Ambulatory Visit: Payer: PRIVATE HEALTH INSURANCE | Attending: Student | Admitting: Occupational Therapy

## 2023-03-19 DIAGNOSIS — M6281 Muscle weakness (generalized): Secondary | ICD-10-CM | POA: Diagnosis present

## 2023-03-19 DIAGNOSIS — M25631 Stiffness of right wrist, not elsewhere classified: Secondary | ICD-10-CM | POA: Insufficient documentation

## 2023-03-19 DIAGNOSIS — M25641 Stiffness of right hand, not elsewhere classified: Secondary | ICD-10-CM | POA: Insufficient documentation

## 2023-03-19 NOTE — Therapy (Signed)
[facility] [address] [place], [address] Phone: [phone]   Fax:  [phone]  Occupational Therapy Treatment  Patient Details  Name: Alice Taylor MRN: [id number] Date of Birth: 1961/08/04 Referring Provider (OT): [place] PA   Encounter Date: 03/19/2023   OT End of Session - 03/19/23 1236     Visit Number 18    Number of Visits 20    Date for OT Re-Evaluation 04/30/23    OT Start Time 1130    OT Stop Time 1210    OT Time Calculation (min) 40 min    Activity Tolerance Patient tolerated treatment well    Behavior During Therapy [facility] for tasks assessed/performed             Past Medical History:  Diagnosis Date   Cancer (HCC) 1970   Skin   GERD (gastroesophageal reflux disease)    History of kidney stones    PONV (postoperative nausea and vomiting)    x1 after orif ankle surgery    Past Surgical History:  Procedure Laterality Date   APPENDECTOMY     KNEE ARTHROSCOPY Right    KNEE ARTHROSCOPY Left 04/24/2018   Procedure: ARTHROSCOPY KNEE, PARTIAL MEDIAL MENISCECTOMY;  Surgeon: Donato Heinz, MD;  Location: [facility] ORS;  Service: Orthopedics;  Laterality: Left;   ORIF ANKLE FRACTURE Right 01/17/2019   Procedure: OPEN REDUCTION INTERNAL FIXATION (ORIF) ANKLE FRACTURE;  Surgeon: Kennedy Bucker, MD;  Location: [facility] ORS;  Service: Orthopedics;  Laterality: Right;   ORIF WRIST FRACTURE Right 02/21/2022   Procedure: OPEN REDUCTION INTERNAL FIXATION (ORIF) OR RIGHT DISTAL RADIUS FRACTURE.;  Surgeon: Christena Flake, MD;  Location: [facility] OR[facility];  Service: Orthopedics;  Laterality: Right;    There were no vitals filed for this visit.   Subjective Assessment - 03/19/23 1235     Subjective  My wrist and hand feels really good- the other day I swapt toward a bee and my elbow popped and my hand /wrist felt not as tight since then - my hand and wrist feels stronger    Pertinent History Pt last seen by PA at Ortho on 04/03/22 -  Alice Taylor is a 62 y.o. female who presents for follow-up now 6 weeks status post an open reduction and internal fixation of her right distal radius fracture with an ulnar styloid fracture. Overall, the patient feels that she is doing well. She still notes some discomfort in her wrist which she rates at 4/10 on today's visit, and for which she has been taking Tylenol and applying ice as necessary with temporary partial relief of her symptoms. She been wearing her Velcro wrist immobilizer on a regular basis, removing it for bathing purposes and for exercises. She still notes moderate stiffness in the wrist and fingers, as well as some swelling, but denies any reinjury to the hand or wrist. She also denies any fevers or chills, and denies any numbness or paresthesias to her fingers.Xray showed good healing at radius and ulnar and refer to therapy - pt has been doing PT at [facility] 3 x wk per pt the last month - pt refer to OT for digits stiffness and possible splint. 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  As well as painting and being able to decorate at different events.    Currently in Pain? No/denies                [facility] OT Assessment - 03/19/23 0001       AROM   Right Forearm Pronation 90 Degrees    Right Forearm Supination 90 Degrees    Right Wrist Extension 75 Degrees    Right Wrist Flexion 90 Degrees    Right Wrist Radial Deviation 20 Degrees    Right Wrist Ulnar Deviation 30 Degrees      Strength   Right Hand Grip (lbs) 43    Right Hand Lateral Pinch 16 lbs    Right Hand 3 Point Pinch 16 lbs    Left Hand Grip (lbs) 54    Left Hand Lateral Pinch 16 lbs    Left Hand 3 Point Pinch  12 lbs      Right Hand AROM   R Little  MCP 0-90 90 Degrees    R Little PIP 0-100 100 Degrees   -15 ext   R Little DIP 0-70 60 Degrees              Patient arrived after  doing HEP for about 3-4 wks - pt show increase wrist  and supination AROM , decrease stiffness reported in R hand and wrist Increase strength in wrist and forearm in all planes- 5-/5 More strength in R hand and wrist reported compare to 3-4 wks ago  Grip and prehension strength increase with using putty - see below She also sone some FMC- and improve with R hand 5 sec  Pt to cont doing same HEP but focus on precision pinch- releasing objects   Wrist and forearm strength increased but she can cont to maintain 2-3 x wk using her firm dark blue putty  12 reps and RTB for wrist flexion, extention  Cont firm dark blue putty gripping more towards pulling with the ulnar 3 fingers of right hand 2 sets of 12 Fine motor coordination patient to work and compare left and right with 5 objects picking up with 2-point pinch from fingertips to palm. 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 -and seeing flexor tightness- LMB splint , cast on 5th PIP  Rolling over putty in between her gripping of putty And table slides for composite extension Follow up in 3 wks-4 wks if needed                 OT Education - 03/19/23 1236     Education Details progress and changes to HEP    Person(s) Educated Patient    Methods Explanation;Demonstration;Tactile cues;Verbal cues;Handout    Comprehension Verbal cues required;Returned demonstration;Verbalized understanding              OT Short Term Goals - 02/26/23 1430       OT SHORT TERM GOAL #2   Title FMC in R hand improve with at least 3 sec to report increase ease with writing , do fasteners    Baseline Simulated 5 objects palm <> fingers tips - R hand 17 sec and L 10 sec- report hand time picking up small  objects or doing fasteners including shoelaces    Time 6  Period Weeks    Status New    Target Date 04/09/23               OT Long Term Goals - 02/26/23 1428       Long Term Additional Goals   Additional Long Term Goals Yes      OT LONG TERM GOAL #6   Title R grip and wrist strength increase for pt to pick up and carry 8lbs with more ease with hand and wrist without increase symptoms    Baseline Grip R 36, L 54 lbs -and dropping objects- increase swelling, stiffness in R hand- harder time with objects that is wider    Time 9    Period Weeks    Status New    Target Date 04/30/23                   Plan - 03/19/23 1237     Clinical Impression Statement Patient was seen by this OT after her diagnosis of postop Colles' fracture R distal radius  ORIF on 02/21/22.  Pt made great progress at that time in about 6 visits but then had had another fall early Oct that resulted in a proximal humerus nondisplaced fracture.  SHe was in sling for while and  started working with PT for shoulder, had bone stimulator - and return to OT with increase stiffness in 5th digit PIP . Pt did in the past show some Cubital tunnel symptoms- tenderness and + Tinel.  She made great progress in OT in past- showed up at OT about 3-4 wks ago  with reports of increase stiffness , weakness and feeling of swelling in R hand and wrist at times- dropping objects, more trouble with wider grip , writing and FMC picking up small objects. At that time she did show some increase stiffness in 5th digit flexion and extention, wrist AROM and strength and grip  strength 36 R , L 54 lbs.  This date pt return showing great progress in wrist AROM and strength, grip increase to 43 lbs , prehension increased- as well as FMC with 5 sec- being the same than L hand now. Pt can cont with grip strengthening and wrist, 5th PIP extention and FMC with focus on precision pinch. Pt can follow up if needed in 3-4 wks.  Pt can benefit from  skilled OT services to monitor if still improving with doing HEP to return to prior level of function.    OT Occupational Profile and History Problem Focused Assessment - Including review of records relating to presenting problem    Occupational performance deficits (Please refer to evaluation for details): ADL's;IADL's;Work;Play;Leisure;Social Participation    Body Structure / Function / Physical Skills ADL;Strength;Dexterity;Pain;Edema;UE functional use;IADL;ROM;Scar mobility;Flexibility    Rehab Potential Good    Clinical Decision Making Limited treatment options, no task modification necessary    Comorbidities Affecting Occupational Performance: None    Modification or Assistance to Complete Evaluation  No modification of tasks or assist necessary to complete eval    OT Frequency Monthly    OT Duration 4 weeks    OT Treatment/Interventions Self-care/ADL training;Fluidtherapy;Moist Heat;Contrast Bath;Paraffin;Manual Therapy;Patient/family education;Passive range of motion;Scar mobilization;Therapeutic exercise;Splinting;DME and/or AE instruction    Consulted and Agree with Plan of Care Patient             Patient will benefit from skilled therapeutic intervention in order to improve the following deficits and impairments:   Body Structure / Function / Physical Skills: ADL, Strength,  Dexterity, Pain, Edema, UE functional use, IADL, ROM, Scar mobility, Flexibility       Visit Diagnosis: Muscle weakness (generalized)  Stiffness of right hand, not elsewhere classified  Stiffness of right wrist, not elsewhere classified    Problem List Patient Active Problem List   Diagnosis Date Noted   Closed right trimalleolar fracture, initial encounter 01/17/2019    Oletta Cohn, OTR/L,CLT 03/19/2023, 12:47 PM  [facility] [address] [place], [address] Phone: [phone]   Fax:  [phone]  Name: Alice Taylor MRN:  [id number] Date of Birth: 1960/11/18

## 2023-06-22 ENCOUNTER — Encounter: Payer: Self-pay | Admitting: *Deleted

## 2023-07-02 ENCOUNTER — Ambulatory Visit: Admission: RE | Admit: 2023-07-02 | Payer: PRIVATE HEALTH INSURANCE | Source: Home / Self Care

## 2023-07-02 HISTORY — DX: Vitamin D deficiency, unspecified: E55.9

## 2023-07-02 HISTORY — DX: Age-related osteoporosis without current pathological fracture: M81.0

## 2023-07-02 SURGERY — COLONOSCOPY WITH PROPOFOL
Anesthesia: General

## 2023-07-10 ENCOUNTER — Other Ambulatory Visit: Payer: Self-pay | Admitting: Gastroenterology

## 2023-07-10 ENCOUNTER — Ambulatory Visit (INDEPENDENT_AMBULATORY_CARE_PROVIDER_SITE_OTHER): Payer: PRIVATE HEALTH INSURANCE

## 2023-07-10 DIAGNOSIS — K449 Diaphragmatic hernia without obstruction or gangrene: Secondary | ICD-10-CM

## 2023-07-10 DIAGNOSIS — R1033 Periumbilical pain: Secondary | ICD-10-CM

## 2023-07-10 DIAGNOSIS — K2950 Unspecified chronic gastritis without bleeding: Secondary | ICD-10-CM

## 2023-07-10 DIAGNOSIS — Z1211 Encounter for screening for malignant neoplasm of colon: Secondary | ICD-10-CM

## 2023-07-10 DIAGNOSIS — R1013 Epigastric pain: Secondary | ICD-10-CM

## 2023-07-10 DIAGNOSIS — K209 Esophagitis, unspecified without bleeding: Secondary | ICD-10-CM

## 2023-07-10 DIAGNOSIS — K5730 Diverticulosis of large intestine without perforation or abscess without bleeding: Secondary | ICD-10-CM

## 2023-07-23 ENCOUNTER — Ambulatory Visit
Admission: RE | Admit: 2023-07-23 | Discharge: 2023-07-23 | Disposition: A | Discharge status: Home / Self Care | Payer: PRIVATE HEALTH INSURANCE | Source: Ambulatory Visit | Attending: Gastroenterology | Admitting: Gastroenterology

## 2023-07-23 DIAGNOSIS — R1013 Epigastric pain: Secondary | ICD-10-CM | POA: Diagnosis present

## 2023-07-23 DIAGNOSIS — R1033 Periumbilical pain: Secondary | ICD-10-CM | POA: Diagnosis present

## 2023-07-23 MED ORDER — IOHEXOL 300 MG/ML  SOLN
100.0000 mL | Freq: Once | INTRAMUSCULAR | Status: AC | PRN
  Administered 2023-07-23: 100 mL via INTRAVENOUS
